# Patient Record
Sex: Male | Born: 1945 | Race: White | Hispanic: No | Marital: Married | Smoking: Former smoker
Health system: Southern US, Community
[De-identification: ages and names within clinical notes are randomized; demographics above are authoritative.]

## PROBLEM LIST (undated history)

## (undated) DIAGNOSIS — I10 Essential (primary) hypertension: Secondary | ICD-10-CM

## (undated) DIAGNOSIS — E039 Hypothyroidism, unspecified: Secondary | ICD-10-CM

## (undated) DIAGNOSIS — E785 Hyperlipidemia, unspecified: Secondary | ICD-10-CM

## (undated) DIAGNOSIS — J449 Chronic obstructive pulmonary disease, unspecified: Secondary | ICD-10-CM

## (undated) DIAGNOSIS — C801 Malignant (primary) neoplasm, unspecified: Secondary | ICD-10-CM

## (undated) DIAGNOSIS — R7309 Other abnormal glucose: Secondary | ICD-10-CM

## (undated) DIAGNOSIS — J439 Emphysema, unspecified: Secondary | ICD-10-CM

## (undated) DIAGNOSIS — Z9889 Other specified postprocedural states: Secondary | ICD-10-CM

## (undated) DIAGNOSIS — Z8719 Personal history of other diseases of the digestive system: Secondary | ICD-10-CM

## (undated) HISTORY — DX: Other abnormal glucose: R73.09

## (undated) HISTORY — DX: Hyperlipidemia, unspecified: E78.5

## (undated) HISTORY — DX: Hypothyroidism, unspecified: E03.9

## (undated) HISTORY — DX: Essential (primary) hypertension: I10

## (undated) HISTORY — PX: HERNIA REPAIR: SHX51

## (undated) HISTORY — DX: Other specified postprocedural states: Z98.890

## (undated) HISTORY — DX: Emphysema, unspecified: J43.9

## (undated) HISTORY — DX: Personal history of other diseases of the digestive system: Z87.19

## (undated) HISTORY — PX: CATARACT EXTRACTION, BILATERAL: SHX1313

## (undated) HISTORY — DX: Chronic obstructive pulmonary disease, unspecified: J44.9

---

## 2005-05-07 ENCOUNTER — Emergency Department (HOSPITAL_COMMUNITY): Admission: EM | Admit: 2005-05-07 | Discharge: 2005-05-07 | Payer: Self-pay | Admitting: Family Medicine

## 2005-05-07 ENCOUNTER — Ambulatory Visit (HOSPITAL_COMMUNITY): Admission: RE | Admit: 2005-05-07 | Discharge: 2005-05-07 | Payer: Self-pay | Admitting: Family Medicine

## 2005-05-11 ENCOUNTER — Ambulatory Visit (HOSPITAL_COMMUNITY)
Admission: RE | Admit: 2005-05-11 | Discharge: 2005-05-11 | Payer: Self-pay | Admitting: Thoracic Surgery (Cardiothoracic Vascular Surgery)

## 2005-05-20 ENCOUNTER — Ambulatory Visit: Payer: Self-pay | Admitting: Internal Medicine

## 2005-05-27 ENCOUNTER — Ambulatory Visit (HOSPITAL_COMMUNITY)
Admission: RE | Admit: 2005-05-27 | Discharge: 2005-05-27 | Payer: Self-pay | Admitting: Thoracic Surgery (Cardiothoracic Vascular Surgery)

## 2005-05-27 ENCOUNTER — Encounter (INDEPENDENT_AMBULATORY_CARE_PROVIDER_SITE_OTHER): Payer: Self-pay | Admitting: *Deleted

## 2005-06-06 ENCOUNTER — Ambulatory Visit: Admission: RE | Admit: 2005-06-06 | Discharge: 2005-08-12 | Payer: Self-pay | Admitting: Radiation Oncology

## 2005-07-08 ENCOUNTER — Ambulatory Visit: Payer: Self-pay | Admitting: Internal Medicine

## 2005-08-17 ENCOUNTER — Ambulatory Visit (HOSPITAL_COMMUNITY): Admission: RE | Admit: 2005-08-17 | Discharge: 2005-08-17 | Payer: Self-pay | Admitting: Internal Medicine

## 2005-08-23 ENCOUNTER — Ambulatory Visit: Payer: Self-pay | Admitting: Internal Medicine

## 2005-10-11 ENCOUNTER — Ambulatory Visit: Payer: Self-pay | Admitting: Internal Medicine

## 2005-11-23 ENCOUNTER — Ambulatory Visit (HOSPITAL_COMMUNITY): Admission: RE | Admit: 2005-11-23 | Discharge: 2005-11-23 | Payer: Self-pay | Admitting: Internal Medicine

## 2005-11-30 ENCOUNTER — Ambulatory Visit: Payer: Self-pay | Admitting: Internal Medicine

## 2006-01-23 ENCOUNTER — Ambulatory Visit: Payer: Self-pay | Admitting: Internal Medicine

## 2006-01-24 ENCOUNTER — Ambulatory Visit (HOSPITAL_COMMUNITY): Admission: RE | Admit: 2006-01-24 | Discharge: 2006-01-24 | Payer: Self-pay | Admitting: Internal Medicine

## 2006-01-25 LAB — CBC WITH DIFFERENTIAL/PLATELET
BASO%: 1 % (ref 0.0–2.0)
Basophils Absolute: 0.1 10*3/uL (ref 0.0–0.1)
EOS%: 3.5 % (ref 0.0–7.0)
Eosinophils Absolute: 0.2 10*3/uL (ref 0.0–0.5)
HCT: 39.2 % (ref 38.7–49.9)
HGB: 13.3 g/dL (ref 13.0–17.1)
LYMPH%: 17.3 % (ref 14.0–48.0)
MCH: 32.8 pg (ref 28.0–33.4)
MCHC: 34 g/dL (ref 32.0–35.9)
MCV: 96.6 fL (ref 81.6–98.0)
MONO#: 0.9 10*3/uL (ref 0.1–0.9)
MONO%: 15.2 % — ABNORMAL HIGH (ref 0.0–13.0)
NEUT#: 3.5 10*3/uL (ref 1.5–6.5)
NEUT%: 63 % (ref 40.0–75.0)
Platelets: 243 10*3/uL (ref 145–400)
RBC: 4.06 10*6/uL — ABNORMAL LOW (ref 4.20–5.71)
RDW: 13.6 % (ref 11.2–14.6)
WBC: 5.6 10*3/uL (ref 4.0–10.0)
lymph#: 1 10*3/uL (ref 0.9–3.3)

## 2006-01-25 LAB — COMPREHENSIVE METABOLIC PANEL
ALT: 11 U/L (ref 0–40)
AST: 15 U/L (ref 0–37)
Albumin: 3.9 g/dL (ref 3.5–5.2)
Alkaline Phosphatase: 69 U/L (ref 39–117)
BUN: 12 mg/dL (ref 6–23)
CO2: 27 mEq/L (ref 19–32)
Calcium: 8.6 mg/dL (ref 8.4–10.5)
Chloride: 106 mEq/L (ref 96–112)
Creatinine, Ser: 1 mg/dL (ref 0.4–1.5)
Glucose, Bld: 83 mg/dL (ref 70–99)
Potassium: 4.5 mEq/L (ref 3.5–5.3)
Sodium: 141 mEq/L (ref 135–145)
Total Bilirubin: 0.4 mg/dL (ref 0.3–1.2)
Total Protein: 6.5 g/dL (ref 6.0–8.3)

## 2006-04-19 ENCOUNTER — Ambulatory Visit: Payer: Self-pay | Admitting: Internal Medicine

## 2006-04-19 LAB — CBC WITH DIFFERENTIAL/PLATELET
BASO%: 0.7 % (ref 0.0–2.0)
Basophils Absolute: 0.1 10*3/uL (ref 0.0–0.1)
EOS%: 2.2 % (ref 0.0–7.0)
Eosinophils Absolute: 0.2 10*3/uL (ref 0.0–0.5)
HCT: 38.9 % (ref 38.7–49.9)
HGB: 13.2 g/dL (ref 13.0–17.1)
LYMPH%: 18.8 % (ref 14.0–48.0)
MCH: 32.4 pg (ref 28.0–33.4)
MCHC: 34 g/dL (ref 32.0–35.9)
MCV: 95.2 fL (ref 81.6–98.0)
MONO#: 1 10*3/uL — ABNORMAL HIGH (ref 0.1–0.9)
MONO%: 11.8 % (ref 0.0–13.0)
NEUT#: 5.4 10*3/uL (ref 1.5–6.5)
NEUT%: 66.5 % (ref 40.0–75.0)
Platelets: 249 10*3/uL (ref 145–400)
RBC: 4.09 10*6/uL — ABNORMAL LOW (ref 4.20–5.71)
RDW: 14 % (ref 11.2–14.6)
WBC: 8.1 10*3/uL (ref 4.0–10.0)
lymph#: 1.5 10*3/uL (ref 0.9–3.3)

## 2006-04-19 LAB — COMPREHENSIVE METABOLIC PANEL
ALT: 13 U/L (ref 0–40)
AST: 18 U/L (ref 0–37)
Albumin: 4.2 g/dL (ref 3.5–5.2)
Alkaline Phosphatase: 75 U/L (ref 39–117)
BUN: 15 mg/dL (ref 6–23)
CO2: 27 mEq/L (ref 19–32)
Calcium: 9.1 mg/dL (ref 8.4–10.5)
Chloride: 103 mEq/L (ref 96–112)
Creatinine, Ser: 1.01 mg/dL (ref 0.40–1.50)
Glucose, Bld: 77 mg/dL (ref 70–99)
Potassium: 4.2 mEq/L (ref 3.5–5.3)
Sodium: 139 mEq/L (ref 135–145)
Total Bilirubin: 0.3 mg/dL (ref 0.3–1.2)
Total Protein: 6.5 g/dL (ref 6.0–8.3)

## 2006-04-21 ENCOUNTER — Ambulatory Visit (HOSPITAL_COMMUNITY): Admission: RE | Admit: 2006-04-21 | Discharge: 2006-04-21 | Payer: Self-pay | Admitting: Internal Medicine

## 2006-07-17 ENCOUNTER — Ambulatory Visit: Payer: Self-pay | Admitting: Internal Medicine

## 2006-07-19 LAB — COMPREHENSIVE METABOLIC PANEL
ALT: 13 U/L (ref 0–53)
AST: 17 U/L (ref 0–37)
Albumin: 3.9 g/dL (ref 3.5–5.2)
Alkaline Phosphatase: 71 U/L (ref 39–117)
BUN: 18 mg/dL (ref 6–23)
CO2: 30 mEq/L (ref 19–32)
Calcium: 9.2 mg/dL (ref 8.4–10.5)
Chloride: 105 mEq/L (ref 96–112)
Creatinine, Ser: 1.14 mg/dL (ref 0.40–1.50)
Glucose, Bld: 103 mg/dL — ABNORMAL HIGH (ref 70–99)
Potassium: 4.1 mEq/L (ref 3.5–5.3)
Sodium: 143 mEq/L (ref 135–145)
Total Bilirubin: 0.3 mg/dL (ref 0.3–1.2)
Total Protein: 6.5 g/dL (ref 6.0–8.3)

## 2006-07-19 LAB — CBC WITH DIFFERENTIAL/PLATELET
BASO%: 0.7 % (ref 0.0–2.0)
Basophils Absolute: 0.1 10*3/uL (ref 0.0–0.1)
EOS%: 1.3 % (ref 0.0–7.0)
Eosinophils Absolute: 0.1 10*3/uL (ref 0.0–0.5)
HCT: 39.6 % (ref 38.7–49.9)
HGB: 13.4 g/dL (ref 13.0–17.1)
LYMPH%: 17.5 % (ref 14.0–48.0)
MCH: 32.3 pg (ref 28.0–33.4)
MCHC: 33.8 g/dL (ref 32.0–35.9)
MCV: 95.5 fL (ref 81.6–98.0)
MONO#: 1 10*3/uL — ABNORMAL HIGH (ref 0.1–0.9)
MONO%: 11.6 % (ref 0.0–13.0)
NEUT#: 6 10*3/uL (ref 1.5–6.5)
NEUT%: 68.9 % (ref 40.0–75.0)
Platelets: 249 10*3/uL (ref 145–400)
RBC: 4.15 10*6/uL — ABNORMAL LOW (ref 4.20–5.71)
RDW: 13.5 % (ref 11.2–14.6)
WBC: 8.7 10*3/uL (ref 4.0–10.0)
lymph#: 1.5 10*3/uL (ref 0.9–3.3)

## 2006-07-24 ENCOUNTER — Ambulatory Visit (HOSPITAL_COMMUNITY): Admission: RE | Admit: 2006-07-24 | Discharge: 2006-07-24 | Payer: Self-pay | Admitting: Internal Medicine

## 2006-11-13 ENCOUNTER — Ambulatory Visit: Payer: Self-pay | Admitting: Internal Medicine

## 2006-11-15 LAB — CBC WITH DIFFERENTIAL/PLATELET
BASO%: 1.4 % (ref 0.0–2.0)
Basophils Absolute: 0.1 10*3/uL (ref 0.0–0.1)
EOS%: 1.1 % (ref 0.0–7.0)
Eosinophils Absolute: 0.1 10*3/uL (ref 0.0–0.5)
HCT: 40.6 % (ref 38.7–49.9)
HGB: 14.2 g/dL (ref 13.0–17.1)
LYMPH%: 16.9 % (ref 14.0–48.0)
MCH: 32.9 pg (ref 28.0–33.4)
MCHC: 35 g/dL (ref 32.0–35.9)
MCV: 94 fL (ref 81.6–98.0)
MONO#: 0.9 10*3/uL (ref 0.1–0.9)
MONO%: 10.3 % (ref 0.0–13.0)
NEUT#: 6.4 10*3/uL (ref 1.5–6.5)
NEUT%: 70.3 % (ref 40.0–75.0)
Platelets: 267 10*3/uL (ref 145–400)
RBC: 4.32 10*6/uL (ref 4.20–5.71)
RDW: 13.7 % (ref 11.2–14.6)
WBC: 9.1 10*3/uL (ref 4.0–10.0)
lymph#: 1.5 10*3/uL (ref 0.9–3.3)

## 2006-11-15 LAB — COMPREHENSIVE METABOLIC PANEL
ALT: 16 U/L (ref 0–53)
AST: 29 U/L (ref 0–37)
Albumin: 4.1 g/dL (ref 3.5–5.2)
Alkaline Phosphatase: 78 U/L (ref 39–117)
BUN: 22 mg/dL (ref 6–23)
CO2: 28 mEq/L (ref 19–32)
Calcium: 8.9 mg/dL (ref 8.4–10.5)
Chloride: 103 mEq/L (ref 96–112)
Creatinine, Ser: 0.99 mg/dL (ref 0.40–1.50)
Glucose, Bld: 76 mg/dL (ref 70–99)
Potassium: 4.1 mEq/L (ref 3.5–5.3)
Sodium: 140 mEq/L (ref 135–145)
Total Bilirubin: 0.3 mg/dL (ref 0.3–1.2)
Total Protein: 7.1 g/dL (ref 6.0–8.3)

## 2006-11-20 ENCOUNTER — Ambulatory Visit (HOSPITAL_COMMUNITY): Admission: RE | Admit: 2006-11-20 | Discharge: 2006-11-20 | Payer: Self-pay | Admitting: Internal Medicine

## 2007-02-08 ENCOUNTER — Ambulatory Visit: Payer: Self-pay | Admitting: Internal Medicine

## 2007-02-12 LAB — COMPREHENSIVE METABOLIC PANEL
ALT: 12 U/L (ref 0–53)
AST: 17 U/L (ref 0–37)
Albumin: 4 g/dL (ref 3.5–5.2)
Alkaline Phosphatase: 70 U/L (ref 39–117)
BUN: 15 mg/dL (ref 6–23)
CO2: 24 mEq/L (ref 19–32)
Calcium: 9 mg/dL (ref 8.4–10.5)
Chloride: 105 mEq/L (ref 96–112)
Creatinine, Ser: 0.99 mg/dL (ref 0.40–1.50)
Glucose, Bld: 83 mg/dL (ref 70–99)
Potassium: 4.1 mEq/L (ref 3.5–5.3)
Sodium: 141 mEq/L (ref 135–145)
Total Bilirubin: 0.3 mg/dL (ref 0.3–1.2)
Total Protein: 6.9 g/dL (ref 6.0–8.3)

## 2007-02-12 LAB — CBC WITH DIFFERENTIAL/PLATELET
BASO%: 0.5 % (ref 0.0–2.0)
Basophils Absolute: 0 10*3/uL (ref 0.0–0.1)
EOS%: 1.6 % (ref 0.0–7.0)
Eosinophils Absolute: 0.1 10*3/uL (ref 0.0–0.5)
HCT: 39.9 % (ref 38.7–49.9)
HGB: 13.9 g/dL (ref 13.0–17.1)
LYMPH%: 21.4 % (ref 14.0–48.0)
MCH: 32.8 pg (ref 28.0–33.4)
MCHC: 34.9 g/dL (ref 32.0–35.9)
MCV: 94 fL (ref 81.6–98.0)
MONO#: 1 10*3/uL — ABNORMAL HIGH (ref 0.1–0.9)
MONO%: 13.1 % — ABNORMAL HIGH (ref 0.0–13.0)
NEUT#: 4.9 10*3/uL (ref 1.5–6.5)
NEUT%: 63.4 % (ref 40.0–75.0)
Platelets: 234 10*3/uL (ref 145–400)
RBC: 4.25 10*6/uL (ref 4.20–5.71)
RDW: 13.2 % (ref 11.2–14.6)
WBC: 7.7 10*3/uL (ref 4.0–10.0)
lymph#: 1.6 10*3/uL (ref 0.9–3.3)

## 2007-02-14 ENCOUNTER — Ambulatory Visit (HOSPITAL_COMMUNITY): Admission: RE | Admit: 2007-02-14 | Discharge: 2007-02-14 | Payer: Self-pay | Admitting: Internal Medicine

## 2007-06-11 ENCOUNTER — Ambulatory Visit: Payer: Self-pay | Admitting: Internal Medicine

## 2007-06-13 LAB — CBC WITH DIFFERENTIAL/PLATELET
BASO%: 0.7 % (ref 0.0–2.0)
Basophils Absolute: 0 10*3/uL (ref 0.0–0.1)
EOS%: 0.8 % (ref 0.0–7.0)
Eosinophils Absolute: 0.1 10*3/uL (ref 0.0–0.5)
HCT: 38.3 % — ABNORMAL LOW (ref 38.7–49.9)
HGB: 13.2 g/dL (ref 13.0–17.1)
LYMPH%: 21.2 % (ref 14.0–48.0)
MCH: 32.7 pg (ref 28.0–33.4)
MCHC: 34.5 g/dL (ref 32.0–35.9)
MCV: 94.7 fL (ref 81.6–98.0)
MONO#: 1 10*3/uL — ABNORMAL HIGH (ref 0.1–0.9)
MONO%: 14.4 % — ABNORMAL HIGH (ref 0.0–13.0)
NEUT#: 4.4 10*3/uL (ref 1.5–6.5)
NEUT%: 62.9 % (ref 40.0–75.0)
Platelets: 232 10*3/uL (ref 145–400)
RBC: 4.04 10*6/uL — ABNORMAL LOW (ref 4.20–5.71)
RDW: 13.4 % (ref 11.2–14.6)
WBC: 7 10*3/uL (ref 4.0–10.0)
lymph#: 1.5 10*3/uL (ref 0.9–3.3)

## 2007-06-13 LAB — COMPREHENSIVE METABOLIC PANEL
ALT: 13 U/L (ref 0–53)
AST: 17 U/L (ref 0–37)
Albumin: 3.9 g/dL (ref 3.5–5.2)
Alkaline Phosphatase: 65 U/L (ref 39–117)
BUN: 15 mg/dL (ref 6–23)
CO2: 26 mEq/L (ref 19–32)
Calcium: 8.8 mg/dL (ref 8.4–10.5)
Chloride: 106 mEq/L (ref 96–112)
Creatinine, Ser: 0.9 mg/dL (ref 0.40–1.50)
Glucose, Bld: 94 mg/dL (ref 70–99)
Potassium: 4.1 mEq/L (ref 3.5–5.3)
Sodium: 140 mEq/L (ref 135–145)
Total Bilirubin: 0.4 mg/dL (ref 0.3–1.2)
Total Protein: 6.6 g/dL (ref 6.0–8.3)

## 2007-06-15 ENCOUNTER — Ambulatory Visit (HOSPITAL_COMMUNITY): Admission: RE | Admit: 2007-06-15 | Discharge: 2007-06-15 | Payer: Self-pay | Admitting: Internal Medicine

## 2007-12-10 ENCOUNTER — Ambulatory Visit: Payer: Self-pay | Admitting: Internal Medicine

## 2007-12-12 LAB — CBC WITH DIFFERENTIAL/PLATELET
BASO%: 0.6 % (ref 0.0–2.0)
Basophils Absolute: 0 10*3/uL (ref 0.0–0.1)
EOS%: 1.2 % (ref 0.0–7.0)
Eosinophils Absolute: 0.1 10*3/uL (ref 0.0–0.5)
HCT: 40.4 % (ref 38.7–49.9)
HGB: 14 g/dL (ref 13.0–17.1)
LYMPH%: 19.4 % (ref 14.0–48.0)
MCH: 32.3 pg (ref 28.0–33.4)
MCHC: 34.8 g/dL (ref 32.0–35.9)
MCV: 92.9 fL (ref 81.6–98.0)
MONO#: 0.9 10*3/uL (ref 0.1–0.9)
MONO%: 10.2 % (ref 0.0–13.0)
NEUT#: 5.9 10*3/uL (ref 1.5–6.5)
NEUT%: 68.6 % (ref 40.0–75.0)
Platelets: 213 10*3/uL (ref 145–400)
RBC: 4.35 10*6/uL (ref 4.20–5.71)
RDW: 13.3 % (ref 11.2–14.6)
WBC: 8.6 10*3/uL (ref 4.0–10.0)
lymph#: 1.7 10*3/uL (ref 0.9–3.3)

## 2007-12-12 LAB — COMPREHENSIVE METABOLIC PANEL
ALT: 15 U/L (ref 0–53)
AST: 18 U/L (ref 0–37)
Albumin: 4.2 g/dL (ref 3.5–5.2)
Alkaline Phosphatase: 82 U/L (ref 39–117)
BUN: 22 mg/dL (ref 6–23)
CO2: 24 mEq/L (ref 19–32)
Calcium: 9.1 mg/dL (ref 8.4–10.5)
Chloride: 105 mEq/L (ref 96–112)
Creatinine, Ser: 1.08 mg/dL (ref 0.40–1.50)
Glucose, Bld: 95 mg/dL (ref 70–99)
Potassium: 4.5 mEq/L (ref 3.5–5.3)
Sodium: 140 mEq/L (ref 135–145)
Total Bilirubin: 0.3 mg/dL (ref 0.3–1.2)
Total Protein: 6.8 g/dL (ref 6.0–8.3)

## 2007-12-14 ENCOUNTER — Ambulatory Visit (HOSPITAL_COMMUNITY): Admission: RE | Admit: 2007-12-14 | Discharge: 2007-12-14 | Payer: Self-pay | Admitting: Internal Medicine

## 2008-06-09 ENCOUNTER — Ambulatory Visit: Payer: Self-pay | Admitting: Internal Medicine

## 2008-06-11 LAB — COMPREHENSIVE METABOLIC PANEL
ALT: 15 U/L (ref 0–53)
AST: 18 U/L (ref 0–37)
Albumin: 3.9 g/dL (ref 3.5–5.2)
Alkaline Phosphatase: 71 U/L (ref 39–117)
BUN: 18 mg/dL (ref 6–23)
CO2: 25 mEq/L (ref 19–32)
Calcium: 9.1 mg/dL (ref 8.4–10.5)
Chloride: 105 mEq/L (ref 96–112)
Creatinine, Ser: 1 mg/dL (ref 0.40–1.50)
Glucose, Bld: 96 mg/dL (ref 70–99)
Potassium: 4.1 mEq/L (ref 3.5–5.3)
Sodium: 140 mEq/L (ref 135–145)
Total Bilirubin: 0.3 mg/dL (ref 0.3–1.2)
Total Protein: 6.4 g/dL (ref 6.0–8.3)

## 2008-06-11 LAB — CBC WITH DIFFERENTIAL/PLATELET
BASO%: 0.5 % (ref 0.0–2.0)
Basophils Absolute: 0 10*3/uL (ref 0.0–0.1)
EOS%: 1.6 % (ref 0.0–7.0)
Eosinophils Absolute: 0.1 10*3/uL (ref 0.0–0.5)
HCT: 39.7 % (ref 38.7–49.9)
HGB: 13.6 g/dL (ref 13.0–17.1)
LYMPH%: 25.4 % (ref 14.0–48.0)
MCH: 32.8 pg (ref 28.0–33.4)
MCHC: 34.3 g/dL (ref 32.0–35.9)
MCV: 95.4 fL (ref 81.6–98.0)
MONO#: 0.7 10*3/uL (ref 0.1–0.9)
MONO%: 11.7 % (ref 0.0–13.0)
NEUT#: 3.5 10*3/uL (ref 1.5–6.5)
NEUT%: 60.8 % (ref 40.0–75.0)
Platelets: 198 10*3/uL (ref 145–400)
RBC: 4.16 10*6/uL — ABNORMAL LOW (ref 4.20–5.71)
RDW: 13.1 % (ref 11.2–14.6)
WBC: 5.8 10*3/uL (ref 4.0–10.0)
lymph#: 1.5 10*3/uL (ref 0.9–3.3)

## 2008-06-13 ENCOUNTER — Ambulatory Visit (HOSPITAL_COMMUNITY): Admission: RE | Admit: 2008-06-13 | Discharge: 2008-06-13 | Payer: Self-pay | Admitting: Internal Medicine

## 2008-07-10 ENCOUNTER — Ambulatory Visit: Payer: Self-pay | Admitting: Cardiology

## 2008-07-25 ENCOUNTER — Ambulatory Visit: Payer: Self-pay

## 2008-07-25 ENCOUNTER — Ambulatory Visit: Payer: Self-pay | Admitting: Cardiology

## 2008-09-09 ENCOUNTER — Ambulatory Visit: Payer: Self-pay | Admitting: Internal Medicine

## 2008-09-11 LAB — CBC WITH DIFFERENTIAL/PLATELET
BASO%: 0.3 % (ref 0.0–2.0)
Basophils Absolute: 0 10*3/uL (ref 0.0–0.1)
EOS%: 1.2 % (ref 0.0–7.0)
Eosinophils Absolute: 0.1 10*3/uL (ref 0.0–0.5)
HCT: 41 % (ref 38.7–49.9)
HGB: 14 g/dL (ref 13.0–17.1)
LYMPH%: 19.5 % (ref 14.0–48.0)
MCH: 32.8 pg (ref 28.0–33.4)
MCHC: 34.1 g/dL (ref 32.0–35.9)
MCV: 96.2 fL (ref 81.6–98.0)
MONO#: 0.8 10*3/uL (ref 0.1–0.9)
MONO%: 10 % (ref 0.0–13.0)
NEUT#: 5.3 10*3/uL (ref 1.5–6.5)
NEUT%: 69 % (ref 40.0–75.0)
Platelets: 216 10*3/uL (ref 145–400)
RBC: 4.27 10*6/uL (ref 4.20–5.71)
RDW: 13.3 % (ref 11.2–14.6)
WBC: 7.7 10*3/uL (ref 4.0–10.0)
lymph#: 1.5 10*3/uL (ref 0.9–3.3)

## 2008-09-11 LAB — COMPREHENSIVE METABOLIC PANEL
ALT: 25 U/L (ref 0–53)
AST: 30 U/L (ref 0–37)
Albumin: 3.8 g/dL (ref 3.5–5.2)
Alkaline Phosphatase: 70 U/L (ref 39–117)
BUN: 18 mg/dL (ref 6–23)
CO2: 31 mEq/L (ref 19–32)
Calcium: 8.9 mg/dL (ref 8.4–10.5)
Chloride: 102 mEq/L (ref 96–112)
Creatinine, Ser: 1.1 mg/dL (ref 0.40–1.50)
Glucose, Bld: 88 mg/dL (ref 70–99)
Potassium: 4.5 mEq/L (ref 3.5–5.3)
Sodium: 140 mEq/L (ref 135–145)
Total Bilirubin: 0.5 mg/dL (ref 0.3–1.2)
Total Protein: 6.5 g/dL (ref 6.0–8.3)

## 2008-09-15 ENCOUNTER — Ambulatory Visit (HOSPITAL_COMMUNITY): Admission: RE | Admit: 2008-09-15 | Discharge: 2008-09-15 | Payer: Self-pay | Admitting: Internal Medicine

## 2008-12-09 ENCOUNTER — Ambulatory Visit: Payer: Self-pay | Admitting: Internal Medicine

## 2008-12-11 LAB — CBC WITH DIFFERENTIAL/PLATELET
BASO%: 0.6 % (ref 0.0–2.0)
Basophils Absolute: 0 10*3/uL (ref 0.0–0.1)
EOS%: 1.9 % (ref 0.0–7.0)
Eosinophils Absolute: 0.1 10*3/uL (ref 0.0–0.5)
HCT: 39.1 % (ref 38.4–49.9)
HGB: 13.5 g/dL (ref 13.0–17.1)
LYMPH%: 22.4 % (ref 14.0–49.0)
MCH: 32.8 pg (ref 27.2–33.4)
MCHC: 34.5 g/dL (ref 32.0–36.0)
MCV: 95.2 fL (ref 79.3–98.0)
MONO#: 0.6 10*3/uL (ref 0.1–0.9)
MONO%: 9.5 % (ref 0.0–14.0)
NEUT#: 4.2 10*3/uL (ref 1.5–6.5)
NEUT%: 65.6 % (ref 39.0–75.0)
Platelets: 214 10*3/uL (ref 140–400)
RBC: 4.11 10*6/uL — ABNORMAL LOW (ref 4.20–5.82)
RDW: 13 % (ref 11.0–14.6)
WBC: 6.4 10*3/uL (ref 4.0–10.3)
lymph#: 1.4 10*3/uL (ref 0.9–3.3)

## 2008-12-11 LAB — COMPREHENSIVE METABOLIC PANEL
ALT: 14 U/L (ref 0–53)
AST: 22 U/L (ref 0–37)
Albumin: 4 g/dL (ref 3.5–5.2)
Alkaline Phosphatase: 82 U/L (ref 39–117)
BUN: 18 mg/dL (ref 6–23)
CO2: 27 mEq/L (ref 19–32)
Calcium: 9.3 mg/dL (ref 8.4–10.5)
Chloride: 105 mEq/L (ref 96–112)
Creatinine, Ser: 0.99 mg/dL (ref 0.40–1.50)
Glucose, Bld: 108 mg/dL — ABNORMAL HIGH (ref 70–99)
Potassium: 3.9 mEq/L (ref 3.5–5.3)
Sodium: 142 mEq/L (ref 135–145)
Total Bilirubin: 0.3 mg/dL (ref 0.3–1.2)
Total Protein: 6.2 g/dL (ref 6.0–8.3)

## 2008-12-15 ENCOUNTER — Ambulatory Visit (HOSPITAL_COMMUNITY): Admission: RE | Admit: 2008-12-15 | Discharge: 2008-12-15 | Payer: Self-pay | Admitting: Internal Medicine

## 2009-05-11 DIAGNOSIS — Z8719 Personal history of other diseases of the digestive system: Secondary | ICD-10-CM

## 2009-05-11 DIAGNOSIS — Z9889 Other specified postprocedural states: Secondary | ICD-10-CM

## 2009-05-11 DIAGNOSIS — I2510 Atherosclerotic heart disease of native coronary artery without angina pectoris: Secondary | ICD-10-CM | POA: Insufficient documentation

## 2009-05-11 HISTORY — DX: Personal history of other diseases of the digestive system: Z87.19

## 2009-06-08 ENCOUNTER — Ambulatory Visit: Payer: Self-pay | Admitting: Internal Medicine

## 2009-06-10 LAB — CBC WITH DIFFERENTIAL/PLATELET
BASO%: 0.8 % (ref 0.0–2.0)
Basophils Absolute: 0.1 10*3/uL (ref 0.0–0.1)
EOS%: 2.8 % (ref 0.0–7.0)
Eosinophils Absolute: 0.2 10*3/uL (ref 0.0–0.5)
HCT: 41.4 % (ref 38.4–49.9)
HGB: 14.1 g/dL (ref 13.0–17.1)
LYMPH%: 25.5 % (ref 14.0–49.0)
MCH: 32.6 pg (ref 27.2–33.4)
MCHC: 34.1 g/dL (ref 32.0–36.0)
MCV: 95.5 fL (ref 79.3–98.0)
MONO#: 0.8 10*3/uL (ref 0.1–0.9)
MONO%: 12.6 % (ref 0.0–14.0)
NEUT#: 3.7 10*3/uL (ref 1.5–6.5)
NEUT%: 58.3 % (ref 39.0–75.0)
Platelets: 212 10*3/uL (ref 140–400)
RBC: 4.34 10*6/uL (ref 4.20–5.82)
RDW: 13.5 % (ref 11.0–14.6)
WBC: 6.3 10*3/uL (ref 4.0–10.3)
lymph#: 1.6 10*3/uL (ref 0.9–3.3)

## 2009-06-10 LAB — COMPREHENSIVE METABOLIC PANEL
ALT: 15 U/L (ref 0–53)
AST: 22 U/L (ref 0–37)
Albumin: 4 g/dL (ref 3.5–5.2)
Alkaline Phosphatase: 71 U/L (ref 39–117)
BUN: 18 mg/dL (ref 6–23)
CO2: 28 mEq/L (ref 19–32)
Calcium: 8.9 mg/dL (ref 8.4–10.5)
Chloride: 103 mEq/L (ref 96–112)
Creatinine, Ser: 1 mg/dL (ref 0.40–1.50)
Glucose, Bld: 89 mg/dL (ref 70–99)
Potassium: 4.5 mEq/L (ref 3.5–5.3)
Sodium: 139 mEq/L (ref 135–145)
Total Bilirubin: 0.3 mg/dL (ref 0.3–1.2)
Total Protein: 6.6 g/dL (ref 6.0–8.3)

## 2009-06-12 ENCOUNTER — Ambulatory Visit (HOSPITAL_COMMUNITY): Admission: RE | Admit: 2009-06-12 | Discharge: 2009-06-12 | Payer: Self-pay | Admitting: Internal Medicine

## 2009-12-04 ENCOUNTER — Ambulatory Visit: Payer: Self-pay | Admitting: Internal Medicine

## 2009-12-08 ENCOUNTER — Ambulatory Visit (HOSPITAL_COMMUNITY): Admission: RE | Admit: 2009-12-08 | Discharge: 2009-12-08 | Payer: Self-pay | Admitting: Internal Medicine

## 2009-12-08 LAB — CBC WITH DIFFERENTIAL/PLATELET
BASO%: 0.6 % (ref 0.0–2.0)
Basophils Absolute: 0 10*3/uL (ref 0.0–0.1)
EOS%: 1.9 % (ref 0.0–7.0)
Eosinophils Absolute: 0.1 10*3/uL (ref 0.0–0.5)
HCT: 42.5 % (ref 38.4–49.9)
HGB: 14.3 g/dL (ref 13.0–17.1)
LYMPH%: 24.2 % (ref 14.0–49.0)
MCH: 32 pg (ref 27.2–33.4)
MCHC: 33.6 g/dL (ref 32.0–36.0)
MCV: 95.1 fL (ref 79.3–98.0)
MONO#: 0.9 10*3/uL (ref 0.1–0.9)
MONO%: 13.7 % (ref 0.0–14.0)
NEUT#: 3.8 10*3/uL (ref 1.5–6.5)
NEUT%: 59.6 % (ref 39.0–75.0)
Platelets: 183 10*3/uL (ref 140–400)
RBC: 4.47 10*6/uL (ref 4.20–5.82)
RDW: 12.9 % (ref 11.0–14.6)
WBC: 6.3 10*3/uL (ref 4.0–10.3)
lymph#: 1.5 10*3/uL (ref 0.9–3.3)

## 2009-12-08 LAB — COMPREHENSIVE METABOLIC PANEL
ALT: 18 U/L (ref 0–53)
AST: 28 U/L (ref 0–37)
Albumin: 3.7 g/dL (ref 3.5–5.2)
Alkaline Phosphatase: 63 U/L (ref 39–117)
BUN: 15 mg/dL (ref 6–23)
CO2: 29 mEq/L (ref 19–32)
Calcium: 9 mg/dL (ref 8.4–10.5)
Chloride: 104 mEq/L (ref 96–112)
Creatinine, Ser: 1.05 mg/dL (ref 0.40–1.50)
Glucose, Bld: 88 mg/dL (ref 70–99)
Potassium: 4.1 mEq/L (ref 3.5–5.3)
Sodium: 139 mEq/L (ref 135–145)
Total Bilirubin: 0.6 mg/dL (ref 0.3–1.2)
Total Protein: 6.7 g/dL (ref 6.0–8.3)

## 2010-06-09 ENCOUNTER — Ambulatory Visit: Payer: Self-pay | Admitting: Internal Medicine

## 2010-06-11 ENCOUNTER — Ambulatory Visit (HOSPITAL_COMMUNITY): Admission: RE | Admit: 2010-06-11 | Discharge: 2010-06-11 | Payer: Self-pay | Admitting: Internal Medicine

## 2010-06-11 LAB — CBC WITH DIFFERENTIAL/PLATELET
BASO%: 0.4 % (ref 0.0–2.0)
Basophils Absolute: 0 10*3/uL (ref 0.0–0.1)
EOS%: 2.1 % (ref 0.0–7.0)
Eosinophils Absolute: 0.1 10*3/uL (ref 0.0–0.5)
HCT: 42.5 % (ref 38.4–49.9)
HGB: 14.6 g/dL (ref 13.0–17.1)
LYMPH%: 20.7 % (ref 14.0–49.0)
MCH: 33.1 pg (ref 27.2–33.4)
MCHC: 34.5 g/dL (ref 32.0–36.0)
MCV: 96 fL (ref 79.3–98.0)
MONO#: 0.7 10*3/uL (ref 0.1–0.9)
MONO%: 11.8 % (ref 0.0–14.0)
NEUT#: 4.1 10*3/uL (ref 1.5–6.5)
NEUT%: 65 % (ref 39.0–75.0)
Platelets: 211 10*3/uL (ref 140–400)
RBC: 4.42 10*6/uL (ref 4.20–5.82)
RDW: 13.5 % (ref 11.0–14.6)
WBC: 6.3 10*3/uL (ref 4.0–10.3)
lymph#: 1.3 10*3/uL (ref 0.9–3.3)

## 2010-06-11 LAB — COMPREHENSIVE METABOLIC PANEL
ALT: 22 U/L (ref 0–53)
AST: 25 U/L (ref 0–37)
Albumin: 4 g/dL (ref 3.5–5.2)
Alkaline Phosphatase: 62 U/L (ref 39–117)
BUN: 14 mg/dL (ref 6–23)
CO2: 30 mEq/L (ref 19–32)
Calcium: 9.4 mg/dL (ref 8.4–10.5)
Chloride: 106 mEq/L (ref 96–112)
Creatinine, Ser: 1.11 mg/dL (ref 0.40–1.50)
Glucose, Bld: 93 mg/dL (ref 70–99)
Potassium: 4 mEq/L (ref 3.5–5.3)
Sodium: 141 mEq/L (ref 135–145)
Total Bilirubin: 0.5 mg/dL (ref 0.3–1.2)
Total Protein: 7.2 g/dL (ref 6.0–8.3)

## 2010-09-25 ENCOUNTER — Other Ambulatory Visit: Payer: Self-pay | Admitting: Internal Medicine

## 2010-09-25 DIAGNOSIS — C3490 Malignant neoplasm of unspecified part of unspecified bronchus or lung: Secondary | ICD-10-CM

## 2011-01-18 NOTE — Assessment & Plan Note (Signed)
[facility]                            CARDIOLOGY OFFICE NOTE   NAME:Christopher Peck, Christopher Peck                      MRN:          [id number]  DATE:07/10/2008                            DOB:          1946/03/26    PRIMARY CARE PHYSICIAN:  Lovenia Kim, DO.   REASON FOR PRESENTATION:  Evaluate the patient with CT of the chest  demonstrating coronary atherosclerosis.   HISTORY OF PRESENT ILLNESS:  The patient is a pleasant 65 year old  gentleman, who has a history of non-small cell lung cancer.  He was  treated with chemotherapy and radiation and has been out 3 years with no  recurrence.  However, on CT scan done last month, he was noted to have  coronary artery atherosclerosis.  Because of this and some complaints of  dyspnea and chest discomfort, he is referred.   The patient has never had any cardiac history.  He has never had any  cardiac workup.  He does do some walking everyday.  He walks 1-2 miles.  He does not walk particularly quickly.  He says when he does, he may get  short of breath.  He has noticed this for 3 years since he had his  radiation.  It is not profound.  He does not have it at rest.  He does  not have any PND or orthopnea.  It is not getting any worse.  He does  have back discomfort.  It is under his left scapula.  There is a point  of tenderness.  It becomes sometimes quite intense.  It happens  sporadically.  The etiology has not been clear.  He has been treated  with tramadol, which seems to help.  He cannot bring this on.  It does  not radiate.  It is not associated with the shortness of breath and he  has no nausea, vomiting, or diaphoresis with this.   PAST MEDICAL HISTORY:  Stage IIIA non-small cell lung cancer, squamous  cell, 2006 (treated with radiation, carboplatin, and paclitaxel,  docetaxel), apparent radiation fibrosis.   PAST SURGICAL HISTORY:  Bilateral inguinal hernia repair.   ALLERGIES:  None.   MEDICATIONS:   Citalopram and vitamin D.   SOCIAL HISTORY:  The patient is married.  He has 2 children.  He smoked  1 pack per day for about 40 years, but quit 5 years ago.   FAMILY HISTORY:  Noncontributory for early coronary artery disease.   REVIEW OF SYSTEMS:  As stated in the HPI, positive for occasional  dizziness, ringing in his ears, dentures, incontinence, and joint pains.  Negative for all other systems.   PHYSICAL EXAMINATION:  GENERAL:  The patient is pleasant and in no  distress.  VITAL SIGNS:  Blood pressure 133/78, heart rate 58 and regular, and  weight 191 pounds.  HEENT:  Eyelids are unremarkable.  Pupils equal, round, and reactive to  light.  Fundi not visualized.  Oral mucosa unremarkable.  NECK:  No  jugular venous distention at 45 degrees, carotid upstroke brisk and  symmetrical.  No bruits.  No thyromegaly.  LYMPHATICS:  No cervical, axillary, or inguinal adenopathy.  LUNGS:  Clear to auscultation bilaterally.  BACK:  No costovertebral angle tenderness.  CHEST:  Unremarkable.  HEART:  PMI not displaced or sustained.  S1 and S2 within normal limits.  No S3, no S4.  No clicks, no rubs, and no murmurs.  ABDOMEN:  Flat, positive bowel sounds, normal in frequency and pitch, no  bruits, no rebound, no guarding, or no midline pulsatile mass.  No  hepatomegaly or splenomegaly.  SKIN:  No rashes, no nodules.  EXTREMITIES:  Pulses 2+ throughout.  No edema, cyanosis, or clubbing.  NEUROLOGIC:  Oriented to person, place, and time.  Cranial nerves II  through XII grossly intact, motor grossly intact.   EKG:  Sinus bradycardia, rate 57, axis within normal limits, intervals  within normal limits, and no acute ST-T wave changes.   ASSESSMENT AND PLAN:  1. Coronary atherosclerosis:  The patient had this documented with      apparent calcification on a CT noted incidentally.  He has no      symptoms consistent with obstructive coronary artery disease.  He      has had some dyspnea, but  this has been since his radiation      therapy.  At this point, I think the possibility of obstructive      atherosclerosis is on the low to low-moderate level.  I think,      screening with an exercise treadmill would be reasonable.  In      particular, this would allow me to answer some questions he has      about an exercise regimen and what he should try to do.  This will      allow me to risk-stratify as well as rule out obstructive disease.      He understands from our conversation that this study does indicate      that he most likely has nonobstructive disease, but we would manage      this with risk reduction in the absence of symptoms or evidence of      ischemia.  2. Dyspnea:  We will evaluate this.  I will check a BNP level when he      comes back as well.  I suspect again this is related to his      previous lung problems.  3. Tobacco:  The patient quit smoking 5 years ago.  4. Followup:  I will see him at the time of his plain old exercise      treadmill (POET).     Rollene Rotunda, MD, [facility]  Electronically Signed    JH/MedQ  DD: 07/10/2008  DT: 07/11/2008  Job #: [id number]   cc:   Lovenia Kim, D.O.

## 2011-01-18 NOTE — Procedures (Signed)
[facility]                              EXERCISE TREADMILL   NAME:Brickner, ASIAH BEFORT                      MRN:          [id number]  DATE:07/25/2008                            DOB:          1946/04/03    PROCEDURE:  Exercise treadmill test.   INDICATION:  The patient with coronary calcification and dyspnea.   PROCEDURE NOTE:  The patient was only able to exercise for 5 minutes and  25 seconds.  His test was stopped because he was very dyspneic.  He did  achieve a target heart rate of 137 which is 85% predicted, actually it  was after he had already stopped walking on the machine.  His blood  pressure actually dropped to 119/60 with exertion.  His oxygen  saturation was in the 80s and he was quite dyspneic at level II.  He  achieved 7.2 METS.  He did not have any arrhythmias.  He had no chest  pain.  He had dyspnea.  He had no ischemic ST-T wave changes.  He did  not have a normal heart rate recovery.   CONCLUSION:  Technically inadequate study.  His heart rate did not reach  target with exercise, though it did in recovery.  He had an abnormal  blood pressure response and so I must interpret this as potentially  positive result.  Based on this, I could not exclude obstructive  coronary artery disease accompanying his known coronary artery  calcification.  His dyspnea may well be related to his lung cancer and  questionable fibrosis.  However, I could not exclude a cardiac etiology.   PLAN:  I discussed this at length with the patient and his wife.  At  this point, they would like to consider their options.  I have suggested  stress perfusion study.  However, financially it is very difficult and  they would really like to defer this, but will get back to me.  For now,  he will continue to follow with Dr. Arbutus Ped very carefully and he will  let me know if he has any increasing symptoms.     Rollene Rotunda, MD, [facility]  Electronically Signed    JH/MedQ  DD: 07/25/2008  DT: 07/26/2008  Job #: [id number]   cc:   Lajuana Matte, MD

## 2011-01-21 NOTE — Op Note (Signed)
NAME:  Christopher Peck, Christopher Peck               ACCOUNT NO.:  [id number]   MEDICAL RECORD NO.:  [id number]          PATIENT TYPE:  AMB   LOCATION:  SDS                          FACILITY:  [facility]   PHYSICIAN:  Salvatore Decent. Dorris Fetch, M.D.DATE OF BIRTH:  1945/09/14   DATE OF PROCEDURE:  05/27/2005  DATE OF DISCHARGE:  05/27/2005                                 OPERATIVE REPORT   PREOPERATIVE DIAGNOSIS:  Left hilar mass.   POSTOPERATIVE DIAGNOSIS:  Left hilar mass.   PROCEDURE:  Bronchoscopy.   SURGEON:  Salvatore Decent. Dorris Fetch, M.D.   ASSISTANT:  None.   ANESTHESIA:  General.   FINDINGS:  Bronchoscopy:  Mild erythema at the anterior segmental branch of  the left upper lobe.  No bronchial mass seen on the bronchial anatomy.  Mediastinoscopy:  Multiple enlarged, edematous, friable lymph nodes for  frozen section x4, no tumor seen.   CLINICAL NOTE:  [name] is a 65 year old gentleman recently diagnosed  with a left hilar mass, suspicious for a primary bronchogenic carcinoma. He  was advised to undergo bronchoscopy and mediastinoscopy for attempt at  diagnosis and staging.  The indications, risks, benefits and alternatives  were discussed in detail with the patient; he understood this was a  diagnostic and not a curative procedure.   OPERATIVE NOTE:  [name] was brought to the preoperative holding area on  May 27, 2005. There lines were placed by the anesthesia service for  arterial blood pressure monitoring and venous access. Intravenous  antibiotics were administered. He was taken to the operating room,  anesthetized and intubated. Flexible fiberoptic bronchoscopy was carried out  via the endotracheal tube.  This revealed normal endobronchial anatomy with  no endobronchial lesions seen. There was some mild erythema at the anterior  segmental orifice of the left upper lobe bronchus. Brushings and washings  were taken from the left upper lobe bronchus.   The neck and chest then  were prepped and draped in the usual sterile  fashion. A transverse incision was made one fingerbreadth above the sternal  notch; was carried through the skin and subcutaneous tissue. The platysma  was divided.  The strap muscles were separated in the midline.  The  pretracheal fascia was identified and incised.  The pretracheal plane was  developed bluntly into the mediastinum.  The mediastinal scope was inserted  and systematic inspection and dissection of the paratracheal lymph nodes was  carried out.  Multiple enlarged friable, fleshy nodes were identified.  There was no obvious gross tumor involvement.  Biopsies were taken from  three separate level 4R  nodes; 3 level 7 nodes, 2 the level 4L nodes, and 2  level 3 nodes.  Four of these specimens were sent for frozen section; none  of them showed any evidence of tumor.  Rather than persist with additional  frozen sections, the decision was made to wait for the final pathology.  The  wound was packed with gauze. After waiting 5 minutes, the gauze packing was  removed. The mediastinal scope was reinserted.  Final hemostasis was carried  out.  The  mediastinal scope was  withdrawn. The incision then was closed in standard  fashion, with 3-0 Vicryl interrupted sutures in the platysma and 4-0 Vicryl  subcuticular closure.  The patient was extubated in the operating room and  taken to the postanesthetic care unit in stable condition.           ______________________________  Salvatore Decent Dorris Fetch, M.D.     SCH/MEDQ  D:  05/27/2005  T:  05/29/2005  Job:  [id number]   cc:   Viviann Spare A. Waynette Buttery, M.D.  Fax: [phone]

## 2011-06-08 ENCOUNTER — Other Ambulatory Visit (HOSPITAL_COMMUNITY): Payer: Self-pay

## 2011-07-26 ENCOUNTER — Other Ambulatory Visit (HOSPITAL_COMMUNITY): Payer: Self-pay | Admitting: *Deleted

## 2011-07-26 ENCOUNTER — Other Ambulatory Visit (HOSPITAL_COMMUNITY): Payer: Self-pay | Admitting: Internal Medicine

## 2011-07-26 DIAGNOSIS — C3490 Malignant neoplasm of unspecified part of unspecified bronchus or lung: Secondary | ICD-10-CM

## 2011-08-02 ENCOUNTER — Ambulatory Visit (HOSPITAL_COMMUNITY)
Admission: RE | Admit: 2011-08-02 | Discharge: 2011-08-02 | Disposition: A | Discharge status: Home / Self Care | Payer: Medicare Other | Source: Ambulatory Visit | Attending: Internal Medicine | Admitting: Internal Medicine

## 2011-08-02 DIAGNOSIS — Z923 Personal history of irradiation: Secondary | ICD-10-CM | POA: Insufficient documentation

## 2011-08-02 DIAGNOSIS — R0602 Shortness of breath: Secondary | ICD-10-CM | POA: Insufficient documentation

## 2011-08-02 DIAGNOSIS — R0789 Other chest pain: Secondary | ICD-10-CM | POA: Insufficient documentation

## 2011-08-02 DIAGNOSIS — C3490 Malignant neoplasm of unspecified part of unspecified bronchus or lung: Secondary | ICD-10-CM | POA: Insufficient documentation

## 2011-08-02 MED ORDER — IOHEXOL 300 MG/ML  SOLN
80.0000 mL | Freq: Once | INTRAMUSCULAR | Status: AC | PRN
  Administered 2011-08-02: 80 mL via INTRAVENOUS

## 2012-07-05 ENCOUNTER — Other Ambulatory Visit: Payer: Self-pay | Admitting: Internal Medicine

## 2012-07-05 DIAGNOSIS — R7989 Other specified abnormal findings of blood chemistry: Secondary | ICD-10-CM

## 2012-07-10 ENCOUNTER — Ambulatory Visit
Admission: RE | Admit: 2012-07-10 | Discharge: 2012-07-10 | Disposition: A | Discharge status: Home / Self Care | Payer: Medicare Other | Source: Ambulatory Visit | Attending: Internal Medicine | Admitting: Internal Medicine

## 2012-07-10 DIAGNOSIS — R7989 Other specified abnormal findings of blood chemistry: Secondary | ICD-10-CM

## 2013-01-09 ENCOUNTER — Other Ambulatory Visit (HOSPITAL_COMMUNITY): Payer: Self-pay | Admitting: Internal Medicine

## 2013-01-17 ENCOUNTER — Ambulatory Visit (HOSPITAL_COMMUNITY)
Admission: RE | Admit: 2013-01-17 | Discharge: 2013-01-17 | Disposition: A | Discharge status: Home / Self Care | Payer: Medicare Other | Source: Ambulatory Visit | Attending: Internal Medicine | Admitting: Internal Medicine

## 2013-01-17 ENCOUNTER — Encounter (HOSPITAL_COMMUNITY): Payer: Self-pay

## 2013-01-17 DIAGNOSIS — I889 Nonspecific lymphadenitis, unspecified: Secondary | ICD-10-CM | POA: Insufficient documentation

## 2013-01-17 DIAGNOSIS — C3490 Malignant neoplasm of unspecified part of unspecified bronchus or lung: Secondary | ICD-10-CM | POA: Insufficient documentation

## 2013-01-17 DIAGNOSIS — Z923 Personal history of irradiation: Secondary | ICD-10-CM | POA: Insufficient documentation

## 2013-01-17 DIAGNOSIS — K7689 Other specified diseases of liver: Secondary | ICD-10-CM | POA: Insufficient documentation

## 2013-01-17 DIAGNOSIS — R911 Solitary pulmonary nodule: Secondary | ICD-10-CM | POA: Insufficient documentation

## 2013-01-17 DIAGNOSIS — Z9221 Personal history of antineoplastic chemotherapy: Secondary | ICD-10-CM | POA: Insufficient documentation

## 2013-01-17 HISTORY — DX: Malignant (primary) neoplasm, unspecified: C80.1

## 2013-01-17 MED ORDER — IOHEXOL 300 MG/ML  SOLN
80.0000 mL | Freq: Once | INTRAMUSCULAR | Status: AC | PRN
  Administered 2013-01-17: 80 mL via INTRAVENOUS

## 2013-03-06 ENCOUNTER — Ambulatory Visit (HOSPITAL_COMMUNITY)
Admission: RE | Admit: 2013-03-06 | Discharge: 2013-03-06 | Disposition: A | Discharge status: Home / Self Care | Payer: Medicare Other | Source: Ambulatory Visit | Attending: Internal Medicine | Admitting: Internal Medicine

## 2013-03-06 ENCOUNTER — Other Ambulatory Visit (HOSPITAL_COMMUNITY): Payer: Self-pay | Admitting: Internal Medicine

## 2013-03-06 DIAGNOSIS — S838X9A Sprain of other specified parts of unspecified knee, initial encounter: Secondary | ICD-10-CM

## 2013-03-06 DIAGNOSIS — S86819A Strain of other muscle(s) and tendon(s) at lower leg level, unspecified leg, initial encounter: Secondary | ICD-10-CM

## 2013-03-06 DIAGNOSIS — S93409A Sprain of unspecified ligament of unspecified ankle, initial encounter: Secondary | ICD-10-CM

## 2013-03-06 DIAGNOSIS — M25579 Pain in unspecified ankle and joints of unspecified foot: Secondary | ICD-10-CM | POA: Insufficient documentation

## 2013-03-06 DIAGNOSIS — M25569 Pain in unspecified knee: Secondary | ICD-10-CM | POA: Insufficient documentation

## 2013-03-06 DIAGNOSIS — S82109A Unspecified fracture of upper end of unspecified tibia, initial encounter for closed fracture: Secondary | ICD-10-CM | POA: Insufficient documentation

## 2013-03-06 DIAGNOSIS — M25469 Effusion, unspecified knee: Secondary | ICD-10-CM | POA: Insufficient documentation

## 2013-03-06 DIAGNOSIS — M7730 Calcaneal spur, unspecified foot: Secondary | ICD-10-CM | POA: Insufficient documentation

## 2013-04-11 ENCOUNTER — Other Ambulatory Visit: Payer: Self-pay | Admitting: Internal Medicine

## 2013-04-11 DIAGNOSIS — R591 Generalized enlarged lymph nodes: Secondary | ICD-10-CM

## 2013-04-11 DIAGNOSIS — R599 Enlarged lymph nodes, unspecified: Secondary | ICD-10-CM

## 2013-04-12 ENCOUNTER — Ambulatory Visit
Admission: RE | Admit: 2013-04-12 | Discharge: 2013-04-12 | Disposition: A | Discharge status: Home / Self Care | Payer: Medicare Other | Source: Ambulatory Visit | Attending: Internal Medicine | Admitting: Internal Medicine

## 2013-04-12 DIAGNOSIS — R591 Generalized enlarged lymph nodes: Secondary | ICD-10-CM

## 2013-04-12 DIAGNOSIS — R599 Enlarged lymph nodes, unspecified: Secondary | ICD-10-CM

## 2013-07-04 ENCOUNTER — Encounter: Payer: Self-pay | Admitting: Emergency Medicine

## 2013-07-12 ENCOUNTER — Encounter: Payer: Self-pay | Admitting: Internal Medicine

## 2013-07-12 ENCOUNTER — Ambulatory Visit: Payer: Self-pay | Admitting: Internal Medicine

## 2013-07-12 VITALS — BP 122/80 | HR 60 | Temp 98.4°F | Resp 18 | Ht 72.0 in | Wt 187.6 lb

## 2013-07-12 DIAGNOSIS — E559 Vitamin D deficiency, unspecified: Secondary | ICD-10-CM

## 2013-07-12 DIAGNOSIS — E782 Mixed hyperlipidemia: Secondary | ICD-10-CM

## 2013-07-12 DIAGNOSIS — E039 Hypothyroidism, unspecified: Secondary | ICD-10-CM

## 2013-07-12 DIAGNOSIS — J701 Chronic and other pulmonary manifestations due to radiation: Secondary | ICD-10-CM

## 2013-07-12 DIAGNOSIS — R7309 Other abnormal glucose: Secondary | ICD-10-CM

## 2013-07-12 DIAGNOSIS — Z79899 Other long term (current) drug therapy: Secondary | ICD-10-CM

## 2013-07-12 DIAGNOSIS — J449 Chronic obstructive pulmonary disease, unspecified: Secondary | ICD-10-CM

## 2013-07-12 LAB — HEPATIC FUNCTION PANEL
ALT: 13 U/L (ref 0–53)
AST: 25 U/L (ref 0–37)
Albumin: 3.8 g/dL (ref 3.5–5.2)
Alkaline Phosphatase: 70 U/L (ref 39–117)
Bilirubin, Direct: 0.1 mg/dL (ref 0.0–0.3)
Indirect Bilirubin: 0.4 mg/dL (ref 0.0–0.9)
Total Bilirubin: 0.5 mg/dL (ref 0.3–1.2)
Total Protein: 6.4 g/dL (ref 6.0–8.3)

## 2013-07-12 LAB — MAGNESIUM: Magnesium: 1.9 mg/dL (ref 1.5–2.5)

## 2013-07-12 LAB — CBC WITH DIFFERENTIAL/PLATELET
Basophils Absolute: 0 10*3/uL (ref 0.0–0.1)
Basophils Relative: 1 % (ref 0–1)
Eosinophils Absolute: 0.2 10*3/uL (ref 0.0–0.7)
Eosinophils Relative: 2 % (ref 0–5)
HCT: 38.9 % — ABNORMAL LOW (ref 39.0–52.0)
Hemoglobin: 13.4 g/dL (ref 13.0–17.0)
Lymphocytes Relative: 24 % (ref 12–46)
Lymphs Abs: 1.9 10*3/uL (ref 0.7–4.0)
MCH: 32.4 pg (ref 26.0–34.0)
MCHC: 34.4 g/dL (ref 30.0–36.0)
MCV: 94.2 fL (ref 78.0–100.0)
Monocytes Absolute: 0.8 10*3/uL (ref 0.1–1.0)
Monocytes Relative: 10 % (ref 3–12)
Neutro Abs: 5.2 10*3/uL (ref 1.7–7.7)
Neutrophils Relative %: 63 % (ref 43–77)
Platelets: 260 10*3/uL (ref 150–400)
RBC: 4.13 MIL/uL — ABNORMAL LOW (ref 4.22–5.81)
RDW: 13.6 % (ref 11.5–15.5)
WBC: 8.1 10*3/uL (ref 4.0–10.5)

## 2013-07-12 LAB — LIPID PANEL
Cholesterol: 176 mg/dL (ref 0–200)
HDL: 62 mg/dL (ref >39)
LDL Cholesterol: 95 mg/dL (ref 0–99)
Total CHOL/HDL Ratio: 2.8 Ratio
Triglycerides: 94 mg/dL (ref <150)
VLDL: 19 mg/dL (ref 0–40)

## 2013-07-12 LAB — BASIC METABOLIC PANEL WITH GFR
BUN: 13 mg/dL (ref 6–23)
CO2: 28 mEq/L (ref 19–32)
Calcium: 9.2 mg/dL (ref 8.4–10.5)
Chloride: 102 mEq/L (ref 96–112)
Creat: 0.93 mg/dL (ref 0.50–1.35)
GFR, Est African American: >89 mL/min
GFR, Est Non African American: 86 mL/min
Glucose, Bld: 77 mg/dL (ref 70–99)
Potassium: 4.4 mEq/L (ref 3.5–5.3)
Sodium: 140 mEq/L (ref 135–145)

## 2013-07-12 LAB — HEMOGLOBIN A1C
Hgb A1c MFr Bld: 5.4 % (ref <5.7)
Mean Plasma Glucose: 108 mg/dL (ref <117)

## 2013-07-12 MED ORDER — LEVOTHYROXINE SODIUM 125 MCG PO TABS: 125.0000 ug | ORAL_TABLET | ORAL | Status: DC

## 2013-07-12 NOTE — Patient Instructions (Signed)
Continue medicines same pending lab results  Chronic Obstructive Pulmonary Disease Chronic obstructive pulmonary disease (COPD) is a condition in which airflow from the lungs is restricted. The lungs can never return to normal, but there are measures you can take which will improve them and make you feel better. CAUSES   Smoking.  Exposure to secondhand smoke.  Breathing in irritants such as air pollution, dust, cigarette smoke, strong odors, aerosol sprays, or paint fumes.  History of lung infections. SYMPTOMS   Deep, persistent (chronic) cough with a large amount of thick mucus.  Wheezing.  Shortness of breath, especially with physical activity.  Feeling like you cannot get enough air.  Difficulty breathing.  Rapid breaths (tachypnea).  Gray or bluish discoloration (cyanosis) of the skin, especially in fingers, toes, or lips.  Fatigue.  Weight loss.  Swelling in legs, ankles, or feet.  Fast heartbeat (tachycardia).  Frequent lung infections.   Chest tightness. DIAGNOSIS  Initial diagnosis may be based on your history, symptoms, and physical examination. Additional tests for COPD may include:  Chest X-ray.  Computed tomography (CT) scan.  Lung (pulmonary) function tests.  Blood tests. TREATMENT  Treatment focuses on making you comfortable (supportive care). Your caregiver may prescribe medicines (inhaled or pills) to help improve your breathing. Additional treatment options may include oxygen therapy and pulmonary rehabilitation. Treatment should also include reducing your exposure to known irritants and following a plan to stop smoking. HOME CARE INSTRUCTIONS   Take all medicines, including antibiotic medicines, as directed by your caregiver.  Use inhaled medicines as directed by your caregiver.  Avoid medicines or cough syrups that dry up your airway (antihistamines) and slow down the elimination of secretions. This decreases respiratory capacity and may  lead to infections.  If you smoke, stop smoking.  Avoid exposure to smoke, chemicals, and fumes that aggravate your breathing.  Avoid contact with individuals that have a contagious illness.  Avoid extreme temperature and humidity changes.  Use humidifiers at home and at your bedside if they do not make breathing difficult.  Drink enough water and fluids to keep your urine clear or pale yellow. This loosens secretions.  Eat healthy foods. Eating smaller, more frequent meals and resting before meals may help you maintain your strength.  Ask your caregiver about the use of vitamins and mineral supplements.  Stay active. Exercise and physical activity will help maintain your ability to do things you want to do.  Balance activity with periods of rest.  Assume a position of comfort if you become short of breath.  Learn and use relaxation techniques.  Learn and use controlled breathing techniques as directed by your caregiver. Controlled breathing techniques include:  Pursed lip breathing. This breathing technique starts with breathing in (inhaling) through your nose for 1 second. Next, purse your lips as if you were going to whistle. Then breathe out (exhale) through the pursed lips for 2 seconds.  Diaphragmatic breathing. Start by putting one hand on your abdomen just above your waist. Inhale slowly through your nose. The hand on your abdomen should move out. Then exhale slowly through pursed lips. You should be able to feel the hand on your abdomen moving in as you exhale.  Learn and use controlled coughing to clear mucus from your lungs. Controlled coughing is a series of short, progressive coughs. The steps of controlled coughing are: 1. Lean your head slightly forward. 2. Breathe in deeply using diaphragmatic breathing. 3. Try to hold your breath for 3 seconds. 4. Keep  your mouth slightly open while coughing twice. 5. Spit any mucus out into a tissue. 6. Rest and repeat the  steps once or twice as needed.  Receive all protective vaccines your caregiver suggests, especially pneumococcal and influenza vaccines.  Learn to manage stress.  Schedule and attend all follow-up appointments as directed by your caregiver. It is important to keep all your appointments.  Participate in pulmonary rehabilitation as directed by your caregiver.  Use home oxygen as suggested. SEEK MEDICAL CARE IF:   You are coughing up more mucus than usual.  There is a change in the color or thickness of the mucus.  Breathing is more labored than usual.  Your breathing is faster than usual.  Your skin color is more cyanotic than usual.  You are running out of the medicine you take for your breathing.  You are anxious, apprehensive, or restless.  You have a fever. SEEK IMMEDIATE MEDICAL CARE IF:   You have a rapid heart rate.  You have shortness of breath while you are resting.  You have shortness of breath that prevents you from being able to talk.  You have shortness of breath that prevents you from performing your usual physical activities.  You have chest pain lasting longer than 5 minutes.  You have a seizure.  Your family or friends notice that you are agitated or confused. MAKE SURE YOU:   Understand these instructions.  Will watch your condition.  Will get help right away if you are not doing well or get worse. Document Released: 06/01/2005 Document Revised: 05/16/2012 Document Reviewed: 04/18/2013 [facility] Patient Information 2014 [place], [place].  Hypothyroidism The thyroid is a large gland located in the lower front of your neck. The thyroid gland helps control metabolism. Metabolism is how your body handles food. It controls metabolism with the hormone thyroxine. When this gland is underactive (hypothyroid), it produces too little hormone.  CAUSES These include:   Absence or destruction of thyroid tissue.  Goiter due to iodine deficiency.  Goiter  due to medications.  Congenital defects (since birth).  Problems with the pituitary. This causes a lack of TSH (thyroid stimulating hormone). This hormone tells the thyroid to turn out more hormone. SYMPTOMS  Lethargy (feeling as though you have no energy)  Cold intolerance  Weight gain (in spite of normal food intake)  Dry skin  Coarse hair  Menstrual irregularity (if severe, may lead to infertility)  Slowing of thought processes Cardiac problems are also caused by insufficient amounts of thyroid hormone. Hypothyroidism in the newborn is cretinism, and is an extreme form. It is important that this form be treated adequately and immediately or it will lead rapidly to retarded physical and mental development. DIAGNOSIS  To prove hypothyroidism, your caregiver may do blood tests and ultrasound tests. Sometimes the signs are hidden. It may be necessary for your caregiver to watch this illness with blood tests either before or after diagnosis and treatment. TREATMENT  Low levels of thyroid hormone are increased by using synthetic thyroid hormone. This is a safe, effective treatment. It usually takes about four weeks to gain the full effects of the medication. After you have the full effect of the medication, it will generally take another four weeks for problems to leave. Your caregiver may start you on low doses. If you have had heart problems the dose may be gradually increased. It is generally not an emergency to get rapidly to normal. HOME CARE INSTRUCTIONS   Take your medications as your caregiver  suggests. Let your caregiver know of any medications you are taking or start taking. Your caregiver will help you with dosage schedules.  As your condition improves, your dosage needs may increase. It will be necessary to have continuing blood tests as suggested by your caregiver.  Report all suspected medication side effects to your caregiver. SEEK MEDICAL CARE IF: Seek medical care if  you develop:  Sweating.  Tremulousness (tremors).  Anxiety.  Rapid weight loss.  Heat intolerance.  Emotional swings.  Diarrhea.  Weakness. SEEK IMMEDIATE MEDICAL CARE IF:  You develop chest pain, an irregular heart beat (palpitations), or a rapid heart beat. MAKE SURE YOU:   Understand these instructions.  Will watch your condition.  Will get help right away if you are not doing well or get worse. Document Released: 08/22/2005 Document Revised: 11/14/2011 Document Reviewed: 04/11/2008 [facility] Patient Information 2014 [place], [place].

## 2013-07-12 NOTE — Progress Notes (Signed)
Patient ID: Christopher Peck, male   DOB: 1946/07/21, 67 y.o.   MRN: [id number]  HPI: This very nice 67 yo MWM presents for 3 month follow up with hypertension, hyperlipidemia, pre-diabetes and vitamin D deficiency.   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 Patient uses 1/2 dose nebulizer bid because of intolerance to the medicine. He does report benefit of the dose as compared to his MDI. He denies cough or sputum production, but admits DOE so that he "paces" himself in his daily activities.   He does has remote Hx/o elevated BP for which he has ben followed expectantly. Today's BP is 128/80. Patient denies any cardiac type chest pain, dizziness, palpitations, orthopnea/PND, claudication, or dependent edema.  Also, prior Hx/o elevated LDL124 in April, 2012 with last LDL 98 in Nov 2012 which is controlled with diet & no meds. Patient denies myalgias or other med SE's.   Also, patient has history of abnormal glucose in the remote past. Patient denies any visual blurring, diabetic polys or paresthesias.  Further, Patient has history of vitamin D deficiency (D=34 in 2008) with last vit D 67in Nov 2013. pt supplementing same   Current outpatient prescriptions:albuterol (ACCUNEB) 1.25 MG/3ML nebulizer solution, Take 1 ampule by nebulization every 6 (six) hours as needed for wheezing., Disp: , Rfl: ;  cholecalciferol (VITAMIN D) 1000 UNITS tablet, Take 1,000 Units by mouth daily., Disp: , Rfl: ;  citalopram (CELEXA) 40 MG tablet, Take 40 mg by mouth daily., Disp: , Rfl: ;  finasteride (PROSCAR) 5 MG tablet, , Disp: , Rfl:  levothyroxine (SYNTHROID, LEVOTHROID) 125 MCG tablet, Take 125 mcg by mouth. 1/2 daily, Disp: , Rfl: ;  Magnesium 500 MG TABS, Take by mouth. daily, Disp: , Rfl: ;  PROAIR HFA 108 (90 BASE) MCG/ACT inhaler, , Disp: , Rfl: ;  traMADol (ULTRAM) 50 MG tablet, , Disp: , Rfl:   Allergies  Allergen Reactions   . Spiriva [Tiotropium Bromide Monohydrate]     Systems Review: Constitutional: Denies fever, chills, wt changes, headaches, insomnia, fatigue, night sweats, change in appetite. Eyes: Denies redness, blurred vision, diplopia, discharge, itchy, watery eyes.  ENT: Denies discharge, congestion, post nasal drip, epistaxis, sore throat, earache, hearing loss, dental pain, Tinnitus, Vertigo, Sinus pain, snoring.  CV: Denies chest pain, palpitations, irregular heartbeat, syncope, dyspnea, diaphoresis, orthopnea, PND, claudication, edema Respiratory: denies cough, sputum, pleurisy, hoarseness, laryngitis, wheezing. + DOE as above. Gastrointestinal: Denies dysphagia, odynophagia, heartburn, reflux, water brash, pain, cramps, nausea, vomiting, bloating, diarrhea, constipation, hematemesis, melena, hematochezia, jaundice, hemorrhoids Genitourinary: Denies dysuria, frequency, urgency, nocturia, hesitancy, discharge, hematuria, flank pain Musculoskeletal: Denies arthralgias, myalgias, stiffness, Jt. Swelling, pain, limp, strain/sprain.  Skin: Denies pruritus, rash, hives, warts, acne, eczema, change in skin lesion(s) Neuro: No weakness, tremor, incoordination, spasms, paresthesia, pain Psychiatric: Denies confusion, memory loss, sensory loss Endo: Denies change in weight, skin, hair change, nocturia, diabetic polys, paresthesias, visual blurring, hyper/hypo-glycemic episodes.  Heme/Lymph: Excessive bleeding, bruising, enlarged lymph nodes  PMHx: FHx: Reviewed / unchanged SHx: Reviewed / unchanged Filed Vitals:   07/12/13 1621  BP: 122/80  Pulse: 60  Temp: 98.4 F (36.9 C)  Resp: 18   Estimated body mass index is 25.44 kg/(m^2) as calculated from the following:   Height as of this encounter: 6' (1.829 m).   Weight as of this encounter: 187 lb 9.6 oz (85.095 kg).  On Exam: Appears well nourished - in NAD. Eyes: PERRLA,  EOMs, conjunctiva no swelling or erythema, normal fundi and  vessels. Sinuses: No Frontal/maxillary tenderness ENT/Mouth: EAC's clear, TM's nl w/o erythema, bulging. Nares clear w/o erythema, swelling, exudates. Oropharynx clear. No ulcers, cracking, on lips. Dentition - unremarkable. No erythema. Tongue normal, non obstructing. Hearing intact.  Neck: Supple. Car 2+/2+. Thyroid nl. No LN, bruits or JVD. Chest: Respirations nl, clear  equal BS w/o  rales, rhonchi, wheezing or stridor.  Cardio: Heart sounds normal w/ regular rate and rhythm without sig. murmurs, gallops,clicks, or rubs. Peripheral pulses normal and equal  without edema.  Abdomen: Soft, flat & bowel sounds normal. Non-tender w/o guarding, rebound, hernias, masses, or organomegaly.  Lymphatics: Non tender without lymphadenopathy.  Musculoskeletal: Full ROM all peripheral extremities, joint stability, 5/5 strength, and normal gait.  Skin: Warm, dry without rashes, lesions, ecchymosis.  Neuro: Cranial nerves intact, reflexes equal bilaterally. Sensory-motor testing grossly intact. Tendon reflexes grossly intact.  Pysch: Alert & oriented x 3. Insight and judgement nl & appropriate. No ideations.  Assessment and Plan:  1. Hypertension - Continue medication, monitor blood pressure at home. Continue diet/meds.  2. Hyperlipidemia - Continue diet/meds, exercise,& lifestyle modifications. Continue monitor cholesterol/LFT's.   3. Pre-diabetes/Insulin Resistance - Continue diet, exercise, lifestyle modifications. Monitor appropriate labs.   4. Vitamin D Deficiency - Continue supplementation  Continue diet & medications same. Further disposition pending lab results.

## 2013-07-13 LAB — INSULIN, FASTING: Insulin fasting, serum: 7 u[IU]/mL (ref 3–28)

## 2013-07-13 LAB — VITAMIN D 25 HYDROXY (VIT D DEFICIENCY, FRACTURES): Vit D, 25-Hydroxy: 35 ng/mL (ref 30–89)

## 2013-07-13 LAB — TSH: TSH: 3.377 u[IU]/mL (ref 0.350–4.500)

## 2013-07-24 ENCOUNTER — Other Ambulatory Visit: Payer: Self-pay | Admitting: Emergency Medicine

## 2013-09-11 ENCOUNTER — Other Ambulatory Visit: Payer: Self-pay | Admitting: Emergency Medicine

## 2013-09-20 ENCOUNTER — Other Ambulatory Visit: Payer: Self-pay | Admitting: Emergency Medicine

## 2013-10-24 ENCOUNTER — Other Ambulatory Visit: Payer: Self-pay | Admitting: Physician Assistant

## 2013-12-10 ENCOUNTER — Other Ambulatory Visit: Payer: Self-pay | Admitting: Emergency Medicine

## 2013-12-23 ENCOUNTER — Other Ambulatory Visit: Payer: Self-pay | Admitting: Internal Medicine

## 2014-01-15 ENCOUNTER — Ambulatory Visit (INDEPENDENT_AMBULATORY_CARE_PROVIDER_SITE_OTHER): Payer: Medicare Other | Admitting: Internal Medicine

## 2014-01-15 ENCOUNTER — Encounter: Payer: Self-pay | Admitting: Internal Medicine

## 2014-01-15 VITALS — BP 122/88 | HR 62 | Temp 98.4°F | Resp 16 | Ht 71.5 in | Wt 204.0 lb

## 2014-01-15 DIAGNOSIS — E782 Mixed hyperlipidemia: Secondary | ICD-10-CM

## 2014-01-15 DIAGNOSIS — R7303 Prediabetes: Secondary | ICD-10-CM | POA: Insufficient documentation

## 2014-01-15 DIAGNOSIS — Z1212 Encounter for screening for malignant neoplasm of rectum: Secondary | ICD-10-CM

## 2014-01-15 DIAGNOSIS — Z1331 Encounter for screening for depression: Secondary | ICD-10-CM

## 2014-01-15 DIAGNOSIS — C3410 Malignant neoplasm of upper lobe, unspecified bronchus or lung: Secondary | ICD-10-CM

## 2014-01-15 DIAGNOSIS — R7309 Other abnormal glucose: Secondary | ICD-10-CM

## 2014-01-15 DIAGNOSIS — R0989 Other specified symptoms and signs involving the circulatory and respiratory systems: Secondary | ICD-10-CM | POA: Insufficient documentation

## 2014-01-15 DIAGNOSIS — E559 Vitamin D deficiency, unspecified: Secondary | ICD-10-CM

## 2014-01-15 DIAGNOSIS — Z789 Other specified health status: Secondary | ICD-10-CM

## 2014-01-15 DIAGNOSIS — Z85118 Personal history of other malignant neoplasm of bronchus and lung: Secondary | ICD-10-CM | POA: Insufficient documentation

## 2014-01-15 DIAGNOSIS — Z79899 Other long term (current) drug therapy: Secondary | ICD-10-CM

## 2014-01-15 DIAGNOSIS — I10 Essential (primary) hypertension: Secondary | ICD-10-CM

## 2014-01-15 DIAGNOSIS — E039 Hypothyroidism, unspecified: Secondary | ICD-10-CM | POA: Insufficient documentation

## 2014-01-15 DIAGNOSIS — Z0000 Encounter for general adult medical examination without abnormal findings: Secondary | ICD-10-CM

## 2014-01-15 DIAGNOSIS — J4489 Other specified chronic obstructive pulmonary disease: Secondary | ICD-10-CM

## 2014-01-15 DIAGNOSIS — Z125 Encounter for screening for malignant neoplasm of prostate: Secondary | ICD-10-CM

## 2014-01-15 DIAGNOSIS — N320 Bladder-neck obstruction: Secondary | ICD-10-CM | POA: Insufficient documentation

## 2014-01-15 DIAGNOSIS — J449 Chronic obstructive pulmonary disease, unspecified: Secondary | ICD-10-CM

## 2014-01-15 HISTORY — DX: Vitamin D deficiency, unspecified: E55.9

## 2014-01-15 LAB — CBC WITH DIFFERENTIAL/PLATELET
Basophils Absolute: 0.1 10*3/uL (ref 0.0–0.1)
Basophils Relative: 1 % (ref 0–1)
Eosinophils Absolute: 0.2 10*3/uL (ref 0.0–0.7)
Eosinophils Relative: 2 % (ref 0–5)
HCT: 42.7 % (ref 39.0–52.0)
Hemoglobin: 14.3 g/dL (ref 13.0–17.0)
Lymphocytes Relative: 26 % (ref 12–46)
Lymphs Abs: 2 10*3/uL (ref 0.7–4.0)
MCH: 31.8 pg (ref 26.0–34.0)
MCHC: 33.5 g/dL (ref 30.0–36.0)
MCV: 95.1 fL (ref 78.0–100.0)
Monocytes Absolute: 0.8 10*3/uL (ref 0.1–1.0)
Monocytes Relative: 10 % (ref 3–12)
Neutro Abs: 4.8 10*3/uL (ref 1.7–7.7)
Neutrophils Relative %: 61 % (ref 43–77)
Platelets: 243 10*3/uL (ref 150–400)
RBC: 4.49 MIL/uL (ref 4.22–5.81)
RDW: 13.7 % (ref 11.5–15.5)
WBC: 7.8 10*3/uL (ref 4.0–10.5)

## 2014-01-15 LAB — HEMOGLOBIN A1C
Hgb A1c MFr Bld: 5.2 % (ref <5.7)
Mean Plasma Glucose: 103 mg/dL (ref <117)

## 2014-01-15 NOTE — Progress Notes (Signed)
Patient ID: Christopher Peck, male   DOB: Jan 04, 1946, 68 y.o.   MRN: [id number]   Annual Screening Comprehensive Examination  This very nice 68 y.o.  MWM presents for complete physical.  Patient has been followed for labile HTN, screening for Prediabetes, Hyperlipidemia, Hypothyroidism and Vitamin D Deficiency.    Patient was dx'd in 2006 with a small cell Lung Cancer which was treated with radiation and followed in 2007 with Chemotherapy. As an unfortunate consequence he did develop radiation fibrosis of the left lung, but fortunately has not had a recurrence of the cancer.  CTs of [place] last May 2014 was neg. He did develop soft tissue damage around the scapula and reports intermittant moderately severe pain up to 10/10 at it's peak and has been able to tolerate this with 2 or 3 Tramadol per day. 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 He states he is able to "pace himself" to avoid severe dyspnea.   Patient had one elevated BP years ago and has since been monitored expectantly with BP's remaining in the normal range.  Patient's BP has been controlled at home.Today's BP: 122/88 mmHg. Patient denies any cardiac symptoms as chest pain, palpitations, shortness of breath, dizziness or ankle swelling.   Patient did have an elevated LDL chol of 124 in the past which improved to 98 with better diet.  Last lipid profile at goal in Nov as below.  Lab Results  Component Value Date   CHOL 176 07/12/2013   HDL 62 07/12/2013   LDLCALC 95 07/12/2013   TRIG 94 07/12/2013   CHOLHDL 2.8 07/12/2013    Patient is screened for  Prediabetes & insulin resistance with normal values in th past. Patient denies reactive hypoglycemic symptoms, visual blurring, diabetic polys or paresthesias.    Finally, patient has history of Vitamin D Deficiency of 34 in 2008 and last vitamin D was still low at 7 in Nov 2014.  Medication Sig  . albuterol  1.25 MG/3ML neb soln Take 1 ampule by neb every  6  hours as needed   . cholecalciferol (VITAMIN D) 1000 U Take 1,000 Units by mouth daily.  . citalopram (CELEXA) 40 MG tablet Take 40 mg by mouth daily.  . finasteride (PROSCAR) 5 MG tablet TAKE ONE TABLET BY MOUTH ONCE DAILY  . levothyroxine  125 MCG tablet TAKE ONE TABLET BY MOUTH ONCE DAILY  . Magnesium 500 MG TABS Take by mouth. daily  . PROAIR HFA    . traMADol (ULTRAM) 50 MG tablet TAKE ONE TABLET BY MOUTH THREE TIMES DAILY   Allergies  Allergen Reactions  . Spiriva [Tiotropium Bromide Monohydrate]    Past Medical History  Diagnosis Date  . Cancer   . Hypertension   . Hypothyroid   . COPD (chronic obstructive pulmonary disease)   . Emphysema of lung   . Hyperlipidemia   . Other abnormal glucose     No past surgical history on file.  Family History  Problem Relation Age of Onset  . COPD Father   . Kidney failure Father     History   Social History  . Marital Status: Married    Spouse Name: N/A    Number of Children: N/A  . Years of Education: N/A   Occupational History  . Patient has ben on [employer] disability since 2006 due to his pulmonary functions.   Social History Main Topics  . Smoking status: Former Smoker    Quit date:  09/05/2004  . Smokeless tobacco: Not on file  . Alcohol Use: 1.5 oz/week    3 drink(s) per week  . Drug Use: Not on file  . Sexual Activity: Not on file   Other Topics Concern  . Not on file   Social History Narrative  . No narrative on file    ROS Constitutional: Denies fever, chills, weight loss/gain, headaches, insomnia, fatigue, night sweats or change in appetite. Eyes: Denies redness, blurred vision, diplopia, discharge, itchy or watery eyes.  ENT: Denies discharge, congestion, post nasal drip, epistaxis, sore throat, earache, hearing loss, dental pain, Tinnitus, Vertigo, Sinus pain or snoring.  Cardio: Denies chest pain, palpitations, irregular heartbeat, syncope, dyspnea, diaphoresis, orthopnea, PND, claudication or  edema Respiratory: denies cough, pleurisy, hoarseness, laryngitis or wheezing, but does have dyspnea and DOE,  Gastrointestinal: Denies dysphagia, heartburn, reflux, water brash, pain, cramps, nausea, vomiting, bloating, diarrhea, constipation, hematemesis, melena, hematochezia, jaundice or hemorrhoids Genitourinary: Denies dysuria, frequency, urgency, nocturia, hesitancy, discharge, hematuria or flank pain Musculoskeletal: Denies arthralgia, myalgia, stiffness, Jt. Swelling, pain, limp or strain/sprain. Skin: Denies puritis, rash, hives, warts, acne, eczema or change in skin lesion Neuro: No weakness, tremor, incoordination, spasms, paresthesia or pain Psychiatric: Denies confusion, memory loss or sensory loss Endocrine: Denies change in weight, skin, hair change, nocturia, and paresthesia, diabetic polys, visual blurring or hyper / hypo glycemic episodes.  Heme/Lymph: No excessive bleeding, bruising or enlarged lymph nodes.  Physical Exam  BP 122/88  Pulse 62  Temp 98.4 F   Resp 16  Ht 5' 11.5"   Wt 204 lb   BMI 28.06 kg/m2 -  O2 Sat 95% at rest  General Appearance: Well nourished, in no apparent distress. Eyes: PERRLA, EOMs, conjunctiva no swelling or erythema, normal fundi and vessels. Sinuses: No frontal/maxillary tenderness ENT/Mouth: EACs patent / TMs  nl. Nares clear without erythema, swelling, mucoid exudates. Oral hygiene is good. No erythema, swelling, or exudate. Tongue normal, non-obstructing. Tonsils not swollen or erythematous. Hearing normal.  Neck: Supple, thyroid normal. No bruits, nodes or JVD. Respiratory: Respiratory effort normal.  BS equal and clear bilateral without rales, rhonci, wheezing or stridor. Cardio: Heart sounds are normal with regular rate and rhythm and no murmurs, rubs or gallops. Peripheral pulses are normal and equal bilaterally without edema. No aortic or femoral bruits. Chest: symmetric with normal excursions and percussion.  Abdomen: Flat,  soft, with bowl sounds. Nontender, no guarding, rebound, hernias, masses, or organomegaly.  Lymphatics: Non tender without lymphadenopathy.  Genitourinary: No hernias.Testes nl. DRE - prostate nl for age - smooth & firm w/o nodules. Musculoskeletal: Full ROM all peripheral extremities particularly noted around the left shoulder . athere are several Sud-cutaneous firm mo joint stability, 5/5 strength, and normal gait. Skin: Warm and dry without rashes, lesions, cyanosis, clubbing or  ecchymosis.  Neuro: Cranial nerves intact, reflexes equal bilaterally. Normal muscle tone, no cerebellar symptoms. Sensation intact.  Pysch: Awake and oriented X 3, normal affect, insight and judgment appropriate.   Assessment and Plan  1. Annual Screening Examination 2. Hypertension, Labile   3. Hyperlipidemia 4. Pre Diabetes 5. Vitamin D Deficiency 6. Hypothyroidism 7. Small Cell Lung Cancer - presumed cured 8. Radiation fibrosis Lt Lung  Continue prudent diet as discussed, weight control, BP monitoring, regular exercise, and medications as discussed.  Discussed med effects and SE's. Routine screening labs and tests as requested with regular follow-up as recommended.

## 2014-01-15 NOTE — Patient Instructions (Signed)

## 2014-01-16 LAB — MICROALBUMIN / CREATININE URINE RATIO
Creatinine, Urine: 57.7 mg/dL
Microalb Creat Ratio: 8.7 mg/g (ref 0.0–30.0)
Microalb, Ur: 0.5 mg/dL (ref 0.00–1.89)

## 2014-01-16 LAB — HEPATIC FUNCTION PANEL
ALT: 14 U/L (ref 0–53)
AST: 25 U/L (ref 0–37)
Albumin: 4 g/dL (ref 3.5–5.2)
Alkaline Phosphatase: 59 U/L (ref 39–117)
Bilirubin, Direct: 0.1 mg/dL (ref 0.0–0.3)
Indirect Bilirubin: 0.3 mg/dL (ref 0.2–1.2)
Total Bilirubin: 0.4 mg/dL (ref 0.2–1.2)
Total Protein: 6.4 g/dL (ref 6.0–8.3)

## 2014-01-16 LAB — URINALYSIS, MICROSCOPIC ONLY
Bacteria, UA: NONE SEEN
Casts: NONE SEEN
Crystals: NONE SEEN
Squamous Epithelial / LPF: NONE SEEN

## 2014-01-16 LAB — BASIC METABOLIC PANEL WITH GFR
BUN: 15 mg/dL (ref 6–23)
CO2: 26 mEq/L (ref 19–32)
Calcium: 8.9 mg/dL (ref 8.4–10.5)
Chloride: 102 mEq/L (ref 96–112)
Creat: 1.1 mg/dL (ref 0.50–1.35)
GFR, Est African American: 81 mL/min
GFR, Est Non African American: 70 mL/min
Glucose, Bld: 93 mg/dL (ref 70–99)
Potassium: 4.1 mEq/L (ref 3.5–5.3)
Sodium: 139 mEq/L (ref 135–145)

## 2014-01-16 LAB — INSULIN, FASTING: Insulin fasting, serum: 45 u[IU]/mL — ABNORMAL HIGH (ref 3–28)

## 2014-01-16 LAB — MAGNESIUM: Magnesium: 2 mg/dL (ref 1.5–2.5)

## 2014-01-16 LAB — LIPID PANEL
Cholesterol: 179 mg/dL (ref 0–200)
HDL: 55 mg/dL (ref >39)
LDL Cholesterol: 93 mg/dL (ref 0–99)
Total CHOL/HDL Ratio: 3.3 Ratio
Triglycerides: 153 mg/dL — ABNORMAL HIGH (ref <150)
VLDL: 31 mg/dL (ref 0–40)

## 2014-01-16 LAB — PSA: PSA: 0.34 ng/mL (ref <=4.00)

## 2014-01-16 LAB — TSH: TSH: 1.934 u[IU]/mL (ref 0.350–4.500)

## 2014-01-16 LAB — VITAMIN D 25 HYDROXY (VIT D DEFICIENCY, FRACTURES): Vit D, 25-Hydroxy: 38 ng/mL (ref 30–89)

## 2014-01-20 ENCOUNTER — Other Ambulatory Visit: Payer: Self-pay

## 2014-01-20 ENCOUNTER — Other Ambulatory Visit: Payer: Self-pay | Admitting: Internal Medicine

## 2014-01-20 DIAGNOSIS — C3410 Malignant neoplasm of upper lobe, unspecified bronchus or lung: Secondary | ICD-10-CM

## 2014-01-21 ENCOUNTER — Other Ambulatory Visit: Payer: Medicare Other

## 2014-01-29 ENCOUNTER — Ambulatory Visit (HOSPITAL_COMMUNITY)
Admission: RE | Admit: 2014-01-29 | Discharge: 2014-01-29 | Disposition: A | Discharge status: Home / Self Care | Payer: Medicare Other | Source: Ambulatory Visit | Attending: Internal Medicine | Admitting: Internal Medicine

## 2014-01-29 DIAGNOSIS — C3410 Malignant neoplasm of upper lobe, unspecified bronchus or lung: Secondary | ICD-10-CM | POA: Insufficient documentation

## 2014-02-06 ENCOUNTER — Other Ambulatory Visit: Payer: Self-pay | Admitting: Physician Assistant

## 2014-03-24 ENCOUNTER — Other Ambulatory Visit: Payer: Self-pay | Admitting: Internal Medicine

## 2014-04-14 ENCOUNTER — Other Ambulatory Visit: Payer: Self-pay | Admitting: Emergency Medicine

## 2014-06-10 ENCOUNTER — Other Ambulatory Visit: Payer: Self-pay | Admitting: Physician Assistant

## 2014-06-10 ENCOUNTER — Other Ambulatory Visit: Payer: Self-pay | Admitting: Internal Medicine

## 2014-06-10 MED ORDER — TRAMADOL HCL 50 MG PO TABS: ORAL_TABLET | ORAL | Status: DC

## 2014-07-15 ENCOUNTER — Other Ambulatory Visit: Payer: Self-pay | Admitting: Emergency Medicine

## 2014-07-20 NOTE — Patient Instructions (Signed)

## 2014-07-20 NOTE — Progress Notes (Signed)
Patient ID: Christopher Peck, male   DOB: 08-20-1946, 68 y.o.   MRN: [id number]  MEDICARE ANNUAL WELLNESS VISIT AND OV  Assessment:   1. Essential hypertension  - TSH  2. Hyperlipidemia, diet controlled  - Lipid panel  3. Prediabetes  - Hemoglobin A1c - Insulin, fasting  4. Vitamin D deficiency  - Vit D  25 hydroxy   5. Medication management  - CBC with Differential - BASIC METABOLIC PANEL WITH GFR - Hepatic function panel - Magnesium  6. Encounter for general adult medical examination with abnormal findings   7. Depression screen  - Negative   8. Need for prophylactic vaccination and inoculation against influenza  - Flu vaccine HIGH DOSE PF (Fluzone Tri High dose)   Plan:   During the course of the visit the patient was educated and counseled about appropriate screening and preventive services including:    Pneumococcal vaccine   Influenza vaccine  Td vaccine  Screening electrocardiogram  Bone densitometry screening  Colorectal cancer screening  Diabetes screening  Glaucoma screening  Nutrition counseling   Advanced directives: requested  Screening recommendations, referrals: Vaccinations: DT vaccine 01/03/2012 Influenza vaccine HD 07/21/2014 Pneumococcal vaccine 01/03/2012 Prevnar vaccine declined Shingles vaccine declined Hep B vaccine not indicated  Nutrition assessed and recommended  Colonoscopy refuses Recommended yearly ophthalmology/optometry visit for glaucoma screening and checkup Recommended yearly dental visit for hygiene and checkup Advanced directives - yes  Conditions/risks identified: BMI: Discussed weight loss, diet, and increase physical activity.  Increase physical activity: AHA recommends 150 minutes of physical activity a week.  Medications reviewed PreDiabetes is at goal, ACE/ARB therapy: No, Reason not on Ace Inhibitor/ARB therapy:  not indicated Urinary Incontinence is not an issue: discussed non pharmacology  and pharmacology options.  Fall risk: low- (did Fx leg last year falling off of a roof) -discussed PT, home fall assessment, medications.   Subjective:  Christopher Peck is a 68 y.o. MWM who presents for Medicare Annual Wellness Visit and complete physical.  Date of last medicare wellness visit is unknown.  He has had labile elevated blood pressure and has been monitored expectantly. His blood pressure has been controlled at home, today their BP is BP: (!) 100/58 mmHg.  Patient does have pertinent hx/o lung cancer in 2006 treated by radiation and has had no recurrence.   He does workout. He denies chest pain, shortness of breath, dizziness. He does have a hx/o long-standing left shoulder pains which are controlled with 2-3 Tramadol/day.  He is not on cholesterol medication and denies myalgias. His cholesterol is near goal. The cholesterol last visit was:  Lab Results  Component Value Date   CHOL 193 07/21/2014   HDL 52 07/21/2014   LDLCALC 103* 07/21/2014   TRIG 188* 07/21/2014   CHOLHDL 3.7 07/21/2014   He has had prediabetes for 1 years since 2014. He has been working on diet and exercise for prediabetes, and denies foot ulcerations, hyperglycemia, hypoglycemia , nausea, paresthesia of the feet, polydipsia, polyuria and visual disturbances. Last A1C in the office was:  Lab Results  Component Value Date   HGBA1C 5.8* 07/21/2014   Patient is on Vitamin D supplement.   Lab Results  Component Value Date   VD25OH 38 01/15/2014     Names of Other Physician/Practitioners you currently use: 1. [facility] here for primary care 2. Dr Gershon Crane, eye doctor, last visit 2014 3. No dentist - has dentures  Patient Care Team: Unk Pinto, MD as PCP -  General (Internal Medicine) Curt Bears, MD as Consulting Physician (Oncology) Minus Breeding, MD as Consulting Physician (Cardiology) Eulis Manly. Gershon Crane, MD as Consulting Physician  (Ophthalmology)  Medication Review: Medication Sig  . albuterol (ACCUNEB) 1.25 MG/3ML nebulizer solution Take 1 ampule by nebulization every 6 (six) hours as needed for wheezing.  . cholecalciferol (VITAMIN D) 1000 UNITS tablet Take 1,000 Units by mouth daily.  . citalopram (CELEXA) 20 MG tablet TAKE ONE TABLET BY MOUTH ONCE DAILY FOR  MOOD  . finasteride (PROSCAR) 5 MG tablet TAKE ONE TABLET BY MOUTH ONCE DAILY  . levothyroxine (SYNTHROID, LEVOTHROID) 125 MCG tablet TAKE ONE TABLET BY MOUTH ONCE DAILY  . Magnesium 500 MG TABS Take by mouth. daily  . PROAIR HFA 108 (90 BASE) MCG/ACT inhaler   . traMADol (ULTRAM) 50 MG tablet TAKE ONE TABLET BY MOUTH THREE TIMES DAILY AS NEEDED   Current Problems (verified) Patient Active Problem List   Diagnosis Date Noted  . Essential hypertension 01/15/2014  . Vitamin D deficiency 01/15/2014  . Prediabetes 01/15/2014  . Small Cell Lung Cancer (2006) 01/15/2014  . Hyperlipidemia, diet controlled 01/15/2014  . Medication management 01/15/2014  . Hypothyroidism 01/15/2014  . COPD 01/15/2014  . BPH/Prostatism 01/15/2014  . Radiation fibrosis of lung 07/12/2013  . CORONARY ATHEROSCLEROSIS NATIVE CORONARY ARTERY 05/11/2009  . RADIATION THERAPY, HX OF 05/11/2009  . INGUINAL HERNIORRHAPHIES, BILATERAL, HX OF 05/11/2009   Screening Tests Health Maintenance  Topic Date Due  . COLONOSCOPY  07/06/1998  . ZOSTAVAX  07/06/2008  . INFLUENZA VACCINE  04/05/2014  . TETANUS/TDAP  01/02/2022  . PNEUMOCOCCAL POLYSACCHARIDE VACCINE AGE 70 AND OVER  Completed   Immunization History  Administered Date(s) Administered  . Influenza, High Dose Seasonal PF 07/21/2014  . Pneumococcal Polysaccharide-23 01/03/2012  . Td 01/03/2012   Preventative care: Last colonoscopy: refuses  Prior vaccinations: DT: 01/03/2012  Influenza: HD 07/21/2014  Pneumococcal: 01/03/2012 Prevnar: declines Shingles/Zostavax: declines  History reviewed: allergies, current medications,  past family history, past medical history, past social history, past surgical history and problem list  Risk Factors: Tobacco History  Substance Use Topics  . Smoking status: Former Smoker    Quit date: 09/05/2004  . Smokeless tobacco: Not on file  . Alcohol Use: 1.5 oz/week    3 drink(s) per week   He does not smoke.  Patient is a former smoker. Are there smokers in your home (other than you)?  No  Alcohol Current alcohol use: occasional  Caffeine Current caffeine use: coffee 1 /day and tea 2 /day  Exercise Current exercise: walking  Nutrition/Diet Current diet: in general, a "healthy" diet    Cardiac risk factors: advanced age (older than 54 for men, 58 for women), dyslipidemia, hypertension, male gender and sedentary lifestyle.  Depression Screen (Note: if answer to either of the following is "Yes", a more complete depression screening is indicated)   Q1: Over the past two weeks, have you felt down, depressed or hopeless? No  Q2: Over the past two weeks, have you felt little interest or pleasure in doing things? No  Have you lost interest or pleasure in daily life? No  Do you often feel hopeless? No  Do you cry easily over simple problems? No  Activities of Daily Living In your present state of health, do you have any difficulty performing the following activities?:  Driving? No Managing money?  No Feeding yourself? No Getting from bed to chair? No Climbing a flight of stairs? No Preparing food and eating?: No Bathing or  showering? No Getting dressed: No Getting to the toilet? No Using the toilet:No Moving around from place to place: No In the past year have you fallen or had a near fall?:Yes fell off of a roof.   Are you sexually active?  Yes  Do you have more than one partner?  No  Vision Difficulties: No  Hearing Difficulties: No Do you often ask people to speak up or repeat themselves? No Do you experience ringing or noises in your ears? No Do you  have difficulty understanding soft or whispered voices? No  Cognition  Do you feel that you have a problem with memory?No  Do you often misplace items? No  Do you feel safe at home?  Yes  Advanced directives Does patient have a [place]? Yes - wife Does patient have a Living Will? Yes   Objective:     BP 100/58, pulse 68, temp 98.2 F , resp.  16, ht 5' 11.5", wt 209 lb BMI 28.75   General appearance: alert, no distress, WD/WN, male Cognitive Testing  Alert? Yes  Normal Appearance? Yes  Oriented to person? Yes  Place? Yes   Time? Yes  Recall of three objects?  Yes  Can perform simple calculations? Yes  Displays appropriate judgment? Yes  Can read the correct time from a watch/clock? Yes  HEENT: normocephalic, sclerae anicteric, TMs pearly, nares patent, no discharge or erythema, pharynx normal Oral cavity: MMM, no lesions Neck: supple, no lymphadenopathy, no thyromegaly, no masses Heart: RRR, normal S1, S2, no murmurs Lungs: CTA bilaterally, no wheezes, rhonchi, or rales Abdomen: +bs, soft, non tender, non distended, no masses, no hepatomegaly, no splenomegaly Musculoskeletal: nontender, no swelling, no obvious deformity Extremities: no edema, no cyanosis, no clubbing Pulses: 2+ symmetric, upper and lower extremities, normal cap refill Neurological: alert, oriented x 3, CN2-12 intact, strength normal upper extremities and lower extremities, sensation normal throughout, DTRs 2+ throughout, no cerebellar signs, gait normal Psychiatric: normal affect, behavior normal, pleasant   Medicare Attestation I have personally reviewed: The patient's medical and social history Their use of alcohol, tobacco or illicit drugs Their current medications and supplements The patient's functional ability including ADLs,fall risks, home safety risks, cognitive, and hearing and visual impairment Diet and physical activities Evidence for depression or mood disorders  The  patient's weight, height, BMI, and visual acuity have been recorded in the chart.  I have made referrals, counseling, and provided education to the patient based on review of the above and I have provided the patient with a written personalized care plan for preventive services.    Emanuell Morina DAVID, MD   07/21/2014

## 2014-07-21 ENCOUNTER — Encounter: Payer: Self-pay | Admitting: Internal Medicine

## 2014-07-21 ENCOUNTER — Ambulatory Visit (INDEPENDENT_AMBULATORY_CARE_PROVIDER_SITE_OTHER): Payer: Medicare Other | Admitting: Internal Medicine

## 2014-07-21 ENCOUNTER — Other Ambulatory Visit: Payer: Self-pay | Admitting: Physician Assistant

## 2014-07-21 VITALS — BP 100/58 | HR 68 | Temp 98.2°F | Resp 16 | Ht 71.5 in | Wt 209.0 lb

## 2014-07-21 DIAGNOSIS — R6889 Other general symptoms and signs: Secondary | ICD-10-CM

## 2014-07-21 DIAGNOSIS — R7303 Prediabetes: Secondary | ICD-10-CM

## 2014-07-21 DIAGNOSIS — I10 Essential (primary) hypertension: Secondary | ICD-10-CM

## 2014-07-21 DIAGNOSIS — R7309 Other abnormal glucose: Secondary | ICD-10-CM

## 2014-07-21 DIAGNOSIS — Z23 Encounter for immunization: Secondary | ICD-10-CM

## 2014-07-21 DIAGNOSIS — Z0001 Encounter for general adult medical examination with abnormal findings: Secondary | ICD-10-CM

## 2014-07-21 DIAGNOSIS — Z1331 Encounter for screening for depression: Secondary | ICD-10-CM

## 2014-07-21 DIAGNOSIS — E782 Mixed hyperlipidemia: Secondary | ICD-10-CM

## 2014-07-21 DIAGNOSIS — E559 Vitamin D deficiency, unspecified: Secondary | ICD-10-CM

## 2014-07-21 DIAGNOSIS — Z79899 Other long term (current) drug therapy: Secondary | ICD-10-CM

## 2014-07-21 LAB — HEPATIC FUNCTION PANEL
ALT: 13 U/L (ref 0–53)
AST: 21 U/L (ref 0–37)
Albumin: 3.9 g/dL (ref 3.5–5.2)
Alkaline Phosphatase: 58 U/L (ref 39–117)
Bilirubin, Direct: 0.1 mg/dL (ref 0.0–0.3)
Indirect Bilirubin: 0.4 mg/dL (ref 0.2–1.2)
Total Bilirubin: 0.5 mg/dL (ref 0.2–1.2)
Total Protein: 6.5 g/dL (ref 6.0–8.3)

## 2014-07-21 LAB — HEMOGLOBIN A1C
Hgb A1c MFr Bld: 5.8 % — ABNORMAL HIGH (ref <5.7)
Mean Plasma Glucose: 120 mg/dL — ABNORMAL HIGH (ref <117)

## 2014-07-21 LAB — CBC WITH DIFFERENTIAL/PLATELET
Basophils Absolute: 0.1 10*3/uL (ref 0.0–0.1)
Basophils Relative: 1 % (ref 0–1)
Eosinophils Absolute: 0.2 10*3/uL (ref 0.0–0.7)
Eosinophils Relative: 3 % (ref 0–5)
HCT: 43 % (ref 39.0–52.0)
Hemoglobin: 14.5 g/dL (ref 13.0–17.0)
Lymphocytes Relative: 28 % (ref 12–46)
Lymphs Abs: 1.7 10*3/uL (ref 0.7–4.0)
MCH: 32.2 pg (ref 26.0–34.0)
MCHC: 33.7 g/dL (ref 30.0–36.0)
MCV: 95.6 fL (ref 78.0–100.0)
Monocytes Absolute: 0.6 10*3/uL (ref 0.1–1.0)
Monocytes Relative: 10 % (ref 3–12)
Neutro Abs: 3.4 10*3/uL (ref 1.7–7.7)
Neutrophils Relative %: 58 % (ref 43–77)
Platelets: 233 10*3/uL (ref 150–400)
RBC: 4.5 MIL/uL (ref 4.22–5.81)
RDW: 12.7 % (ref 11.5–15.5)
WBC: 5.9 10*3/uL (ref 4.0–10.5)

## 2014-07-21 LAB — BASIC METABOLIC PANEL WITH GFR
BUN: 15 mg/dL (ref 6–23)
CO2: 28 mEq/L (ref 19–32)
Calcium: 9.2 mg/dL (ref 8.4–10.5)
Chloride: 103 mEq/L (ref 96–112)
Creat: 1.07 mg/dL (ref 0.50–1.35)
GFR, Est African American: 83 mL/min
GFR, Est Non African American: 72 mL/min
Glucose, Bld: 81 mg/dL (ref 70–99)
Potassium: 4.2 mEq/L (ref 3.5–5.3)
Sodium: 139 mEq/L (ref 135–145)

## 2014-07-21 LAB — LIPID PANEL
Cholesterol: 193 mg/dL (ref 0–200)
HDL: 52 mg/dL (ref >39)
LDL Cholesterol: 103 mg/dL — ABNORMAL HIGH (ref 0–99)
Total CHOL/HDL Ratio: 3.7 Ratio
Triglycerides: 188 mg/dL — ABNORMAL HIGH (ref <150)
VLDL: 38 mg/dL (ref 0–40)

## 2014-07-21 LAB — TSH: TSH: 1.652 u[IU]/mL (ref 0.350–4.500)

## 2014-07-21 LAB — MAGNESIUM: Magnesium: 1.9 mg/dL (ref 1.5–2.5)

## 2014-07-21 MED ORDER — TRAMADOL HCL 50 MG PO TABS: ORAL_TABLET | ORAL | Status: DC

## 2014-07-21 MED ORDER — ALBUTEROL SULFATE HFA 108 (90 BASE) MCG/ACT IN AERS: INHALATION_SPRAY | RESPIRATORY_TRACT | Status: DC

## 2014-07-21 MED ORDER — CYCLOBENZAPRINE HCL 10 MG PO TABS: ORAL_TABLET | ORAL | Status: DC

## 2014-07-22 LAB — INSULIN, FASTING: Insulin fasting, serum: 23.9 u[IU]/mL — ABNORMAL HIGH (ref 2.0–19.6)

## 2014-07-22 LAB — VITAMIN D 25 HYDROXY (VIT D DEFICIENCY, FRACTURES): Vit D, 25-Hydroxy: 29 ng/mL — ABNORMAL LOW (ref 30–100)

## 2014-11-17 DIAGNOSIS — J449 Chronic obstructive pulmonary disease, unspecified: Secondary | ICD-10-CM | POA: Diagnosis not present

## 2014-11-18 ENCOUNTER — Other Ambulatory Visit: Payer: Self-pay

## 2014-11-18 MED ORDER — LEVOTHYROXINE SODIUM 125 MCG PO TABS: 125.0000 ug | ORAL_TABLET | Freq: Every day | ORAL | Status: DC

## 2014-12-22 ENCOUNTER — Other Ambulatory Visit: Payer: Self-pay | Admitting: *Deleted

## 2014-12-22 ENCOUNTER — Other Ambulatory Visit: Payer: Self-pay | Admitting: Emergency Medicine

## 2014-12-22 MED ORDER — TIZANIDINE HCL 4 MG PO CAPS: 4.0000 mg | ORAL_CAPSULE | Freq: Three times a day (TID) | ORAL | Status: DC

## 2015-01-12 ENCOUNTER — Other Ambulatory Visit: Payer: Self-pay | Admitting: Internal Medicine

## 2015-01-19 ENCOUNTER — Encounter: Payer: Self-pay | Admitting: Internal Medicine

## 2015-01-19 ENCOUNTER — Ambulatory Visit (INDEPENDENT_AMBULATORY_CARE_PROVIDER_SITE_OTHER): Payer: Medicare Other | Admitting: Internal Medicine

## 2015-01-19 ENCOUNTER — Telehealth: Payer: Self-pay | Admitting: *Deleted

## 2015-01-19 ENCOUNTER — Encounter (INDEPENDENT_AMBULATORY_CARE_PROVIDER_SITE_OTHER): Payer: Self-pay

## 2015-01-19 VITALS — BP 118/70 | HR 56 | Temp 97.5°F | Resp 16 | Ht 72.0 in | Wt 210.0 lb

## 2015-01-19 DIAGNOSIS — R7309 Other abnormal glucose: Secondary | ICD-10-CM | POA: Diagnosis not present

## 2015-01-19 DIAGNOSIS — Z1212 Encounter for screening for malignant neoplasm of rectum: Secondary | ICD-10-CM

## 2015-01-19 DIAGNOSIS — R7303 Prediabetes: Secondary | ICD-10-CM

## 2015-01-19 DIAGNOSIS — Z125 Encounter for screening for malignant neoplasm of prostate: Secondary | ICD-10-CM

## 2015-01-19 DIAGNOSIS — E782 Mixed hyperlipidemia: Secondary | ICD-10-CM

## 2015-01-19 DIAGNOSIS — C3490 Malignant neoplasm of unspecified part of unspecified bronchus or lung: Secondary | ICD-10-CM

## 2015-01-19 DIAGNOSIS — E039 Hypothyroidism, unspecified: Secondary | ICD-10-CM | POA: Diagnosis not present

## 2015-01-19 DIAGNOSIS — I2510 Atherosclerotic heart disease of native coronary artery without angina pectoris: Secondary | ICD-10-CM

## 2015-01-19 DIAGNOSIS — R0989 Other specified symptoms and signs involving the circulatory and respiratory systems: Secondary | ICD-10-CM

## 2015-01-19 DIAGNOSIS — Z79899 Other long term (current) drug therapy: Secondary | ICD-10-CM

## 2015-01-19 DIAGNOSIS — E559 Vitamin D deficiency, unspecified: Secondary | ICD-10-CM

## 2015-01-19 DIAGNOSIS — I10 Essential (primary) hypertension: Secondary | ICD-10-CM | POA: Diagnosis not present

## 2015-01-19 DIAGNOSIS — Z1331 Encounter for screening for depression: Secondary | ICD-10-CM

## 2015-01-19 DIAGNOSIS — Z0001 Encounter for general adult medical examination with abnormal findings: Secondary | ICD-10-CM | POA: Diagnosis not present

## 2015-01-19 DIAGNOSIS — N320 Bladder-neck obstruction: Secondary | ICD-10-CM

## 2015-01-19 DIAGNOSIS — R6889 Other general symptoms and signs: Secondary | ICD-10-CM | POA: Diagnosis not present

## 2015-01-19 DIAGNOSIS — Z9181 History of falling: Secondary | ICD-10-CM

## 2015-01-19 DIAGNOSIS — J449 Chronic obstructive pulmonary disease, unspecified: Secondary | ICD-10-CM

## 2015-01-19 LAB — CBC WITH DIFFERENTIAL/PLATELET
Basophils Absolute: 0.1 10*3/uL (ref 0.0–0.1)
Basophils Relative: 1 % (ref 0–1)
Eosinophils Absolute: 0.2 10*3/uL (ref 0.0–0.7)
Eosinophils Relative: 3 % (ref 0–5)
HCT: 41.1 % (ref 39.0–52.0)
Hemoglobin: 13.9 g/dL (ref 13.0–17.0)
Lymphocytes Relative: 33 % (ref 12–46)
Lymphs Abs: 2.4 10*3/uL (ref 0.7–4.0)
MCH: 31.6 pg (ref 26.0–34.0)
MCHC: 33.8 g/dL (ref 30.0–36.0)
MCV: 93.4 fL (ref 78.0–100.0)
MPV: 9.7 fL (ref 8.6–12.4)
Monocytes Absolute: 0.7 10*3/uL (ref 0.1–1.0)
Monocytes Relative: 9 % (ref 3–12)
Neutro Abs: 4 10*3/uL (ref 1.7–7.7)
Neutrophils Relative %: 54 % (ref 43–77)
Platelets: 237 10*3/uL (ref 150–400)
RBC: 4.4 MIL/uL (ref 4.22–5.81)
RDW: 13.5 % (ref 11.5–15.5)
WBC: 7.4 10*3/uL (ref 4.0–10.5)

## 2015-01-19 LAB — TSH: TSH: 1.088 u[IU]/mL (ref 0.350–4.500)

## 2015-01-19 NOTE — Telephone Encounter (Signed)
For ct scan pt prefers the cancer ct please

## 2015-01-19 NOTE — Patient Instructions (Signed)
++++++++++++++++++++++++++++++++++  Recommend Low dose or baby Aspirin 81 mg daily   To reduce risk of Colon Cancer 20 %, Skin Cancer 26 % , Melanoma 46% and   Pancreatic cancer 60%  +++++++++++++++++++++++++++++++++ Vitamin D goal is between 70-100.   Please make sure that you are taking your Vitamin D as directed.   It is very important as a natural antiinflammatory   helping hair, skin, and nails, as well as reducing stroke and heart attack risk.   It helps your bones and helps with mood.  It also decreases numerous cancer risks so please take it as directed.   Low Vit D is associated with a 200-300% higher risk for CANCER   and 200-300% higher risk for HEART   ATTACK  &  STROKE.    ...................................................................................  It is also associated with higher death rate at younger ages,   autoimmune diseases like Rheumatoid arthritis, Lupus, Multiple Sclerosis.     Also many other serious conditions, like depression, Alzheimer's  Dementia, infertility, muscle aches, fatigue, fibromyalgia - just to name a few.  ++++++++++++++++++++++++++++++++++++++++ Recommend the book "The END of DIETING" by Dr Joel Fuhrman   & the book "The END of DIABETES " by Dr Joel Fuhrman  At [employer] - get book & Audio CD's     Being diabetic has a  300% increased risk for heart attack, stroke, cancer, and alzheimer- type vascular dementia. It is very important that you work harder with diet by avoiding all foods that are white. Avoid white rice (brown & wild rice is OK), white potatoes (sweetpotatoes in moderation is OK), White bread or wheat bread or anything made out of white flour like bagels, donuts, rolls, buns, biscuits, cakes, pastries, cookies, pizza crust, and pasta (made from white flour & egg whites) - vegetarian pasta or spinach or wheat pasta is OK. Multigrain breads like Arnold's or [employer], or multigrain sandwich thins or  flatbreads.  Diet, exercise and weight loss can reverse and cure diabetes in the early stages.  Diet, exercise and weight loss is very important in the control and prevention of complications of diabetes which affects every system in your body, ie. Brain - dementia/stroke, eyes - glaucoma/blindness, heart - heart attack/heart failure, kidneys - dialysis, stomach - gastric paralysis, intestines - malabsorption, nerves - severe painful neuritis, circulation - gangrene & loss of a leg(s), and finally cancer and Alzheimers.    I recommend avoid fried & greasy foods,  sweets/candy, white rice (brown or wild rice or Quinoa is OK), white potatoes (sweet potatoes are OK) - anything made from white flour - bagels, doughnuts, rolls, buns, biscuits,white and wheat breads, pizza crust and traditional pasta made of white flour & egg white(vegetarian pasta or spinach or wheat pasta is OK).  Multi-grain bread is OK - like multi-grain flat bread or sandwich thins. Avoid alcohol in excess. Exercise is also important.    Eat all the vegetables you want - avoid meat, especially red meat and dairy - especially cheese.  Cheese is the most concentrated form of trans-fats which is the worst thing to clog up our arteries. Veggie cheese is OK which can be found in the fresh produce section at [employer] or [employer] or [employer]  ++++++++++++++++++++++++++++++++++++++++++++++++++++++++ Preventive Care for Adults A healthy lifestyle and preventive care can promote health and wellness. Preventive health guidelines for men include the following key practices:  A routine yearly physical is a good way to check with your health care provider about your   health and preventative screening. It is a chance to share any concerns and updates on your health and to receive a thorough exam.  Visit your dentist for a routine exam and preventative care every 6 months. Brush your teeth twice a day and floss once a day. Good oral hygiene  prevents tooth decay and gum disease.  The frequency of eye exams is based on your age, health, family medical history, use of contact lenses, and other factors. Follow your health care provider's recommendations for frequency of eye exams.  Eat a healthy diet. Foods such as vegetables, fruits, whole grains, low-fat dairy products, and lean protein foods contain the nutrients you need without too many calories. Decrease your intake of foods high in solid fats, added sugars, and salt. Eat the right amount of calories for you.Get information about a proper diet from your health care provider, if necessary.  Regular physical exercise is one of the most important things you can do for your health. Most adults should get at least 150 minutes of moderate-intensity exercise (any activity that increases your heart rate and causes you to sweat) each week. In addition, most adults need muscle-strengthening exercises on 2 or more days a week.  Maintain a healthy weight. The body mass index (BMI) is a screening tool to identify possible weight problems. It provides an estimate of body fat based on height and weight. Your health care provider can find your BMI and can help you achieve or maintain a healthy weight.For adults 20 years and older:  A BMI below 18.5 is considered underweight.  A BMI of 18.5 to 24.9 is normal.  A BMI of 25 to 29.9 is considered overweight.  A BMI of 30 and above is considered obese.  Maintain normal blood lipids and cholesterol levels by exercising and minimizing your intake of saturated fat. Eat a balanced diet with plenty of fruit and vegetables. Blood tests for lipids and cholesterol should begin at age 20 and be repeated every 5 years. If your lipid or cholesterol levels are high, you are over 50, or you are at high risk for heart disease, you may need your cholesterol levels checked more frequently.Ongoing high lipid and cholesterol levels should be treated with medicines if  diet and exercise are not working.  If you smoke, find out from your health care provider how to quit. If you do not use tobacco, do not start.  Lung cancer screening is recommended for adults aged 55-80 years who are at high risk for developing lung cancer because of a history of smoking. A yearly low-dose CT scan of the lungs is recommended for people who have at least a 30-pack-year history of smoking and are a current smoker or have quit within the past 15 years. A pack year of smoking is smoking an average of 1 pack of cigarettes a day for 1 year (for example: 1 pack a day for 30 years or 2 packs a day for 15 years). Yearly screening should continue until the smoker has stopped smoking for at least 15 years. Yearly screening should be stopped for people who develop a health problem that would prevent them from having lung cancer treatment.  If you choose to drink alcohol, do not have more than 2 drinks per day. One drink is considered to be 12 ounces (355 mL) of beer, 5 ounces (148 mL) of wine, or 1.5 ounces (44 mL) of liquor.  Avoid use of street drugs. Do not share needles with anyone. Ask   for help if you need support or instructions about stopping the use of drugs.  High blood pressure causes heart disease and increases the risk of stroke. Your blood pressure should be checked at least every 1-2 years. Ongoing high blood pressure should be treated with medicines, if weight loss and exercise are not effective.  If you are 45-79 years old, ask your health care provider if you should take aspirin to prevent heart disease.  Diabetes screening involves taking a blood sample to check your fasting blood sugar level. Testing should be considered at a younger age or be carried out more frequently if you are overweight and have at least 1 risk factor for diabetes.  Colorectal cancer can be detected and often prevented. Most routine colorectal cancer screening begins at the age of 50 and continues  through age 75. However, your health care provider may recommend screening at an earlier age if you have risk factors for colon cancer. On a yearly basis, your health care provider may provide home test kits to check for hidden blood in the stool. Use of a small camera at the end of a tube to directly examine the colon (sigmoidoscopy or colonoscopy) can detect the earliest forms of colorectal cancer. Talk to your health care provider about this at age 50, when routine screening begins. Direct exam of the colon should be repeated every 5-10 years through age 75, unless early forms of precancerous polyps or small growths are found.  Hepatitis C blood testing is recommended for all people born from 1945 through 1965 and any individual with known risks for hepatitis C.  Screening for abdominal aortic aneurysm (AAA)  by ultrasound is recommended for people who have history of high blood pressure or who are current or former smokers.  Healthy men should  receive prostate-specific antigen (PSA) blood tests as part of routine cancer screening. Talk with your health care provider about prostate cancer screening.  Testicular cancer screening is  recommended for adult males. Screening includes self-exam, a health care provider exam, and other screening tests. Consult with your health care provider about any symptoms you have or any concerns you have about testicular cancer.  Use sunscreen. Apply sunscreen liberally and repeatedly throughout the day. You should seek shade when your shadow is shorter than you. Protect yourself by wearing long sleeves, pants, a wide-brimmed hat, and sunglasses year round, whenever you are outdoors.  Once a month, do a whole-body skin exam, using a mirror to look at the skin on your back. Tell your health care provider about new moles, moles that have irregular borders, moles that are larger than a pencil eraser, or moles that have changed in shape or color.  Stay current with  required vaccines (immunizations).  Influenza vaccine. All adults should be immunized every year.  Tetanus, diphtheria, and acellular pertussis (Td, Tdap) vaccine. An adult who has not previously received Tdap or who does not know his vaccine status should receive 1 dose of Tdap. This initial dose should be followed by tetanus and diphtheria toxoids (Td) booster doses every 10 years. Adults with an unknown or incomplete history of completing a 3-dose immunization series with Td-containing vaccines should begin or complete a primary immunization series including a Tdap dose. Adults should receive a Td booster every 10 years.  Zoster vaccine. One dose is recommended for adults aged 60 years or older unless certain conditions are present.    PREVNAR - Pneumococcal 13-valent conjugate (PCV13) vaccine. When indicated, a person who is   uncertain of his immunization history and has no record of immunization should receive the PCV13 vaccine. An adult aged 19 years or older who has certain medical conditions and has not been previously immunized should receive 1 dose of PCV13 vaccine. This PCV13 should be followed with a dose of pneumococcal polysaccharide (PPSV23) vaccine. The PPSV23 vaccine dose should be obtained at least 8 weeks after the dose of PCV13 vaccine. An adult aged 19 years or older who has certain medical conditions and previously received 1 or more doses of PPSV23 vaccine should receive 1 dose of PCV13. The PCV13 vaccine dose should be obtained 1 or more years after the last PPSV23 vaccine dose.    PNEUMOVAX - Pneumococcal polysaccharide (PPSV23) vaccine. When PCV13 is also indicated, PCV13 should be obtained first. All adults aged 65 years and older should be immunized. An adult younger than age 65 years who has certain medical conditions should be immunized. Any person who resides in a nursing home or long-term care facility should be immunized. An adult smoker should be immunized. People with  an immunocompromised condition and certain other conditions should receive both PCV13 and PPSV23 vaccines. People with human immunodeficiency virus (HIV) infection should be immunized as soon as possible after diagnosis. Immunization during chemotherapy or radiation therapy should be avoided. Routine use of PPSV23 vaccine is not recommended for American Indians, [place] Natives, or people younger than 65 years unless there are medical conditions that require PPSV23 vaccine. When indicated, people who have unknown immunization and have no record of immunization should receive PPSV23 vaccine. One-time revaccination 5 years after the first dose of PPSV23 is recommended for people aged 19-64 years who have chronic kidney failure, nephrotic syndrome, asplenia, or immunocompromised conditions. People who received 1-2 doses of PPSV23 before age 65 years should receive another dose of PPSV23 vaccine at age 65 years or later if at least 5 years have passed since the previous dose. Doses of PPSV23 are not needed for people immunized with PPSV23 at or after age 65 years.    Hepatitis A vaccine. Adults who wish to be protected from this disease, have certain high-risk conditions, work with hepatitis A-infected animals, work in hepatitis A research labs, or travel to or work in countries with a high rate of hepatitis A should be immunized. Adults who were previously unvaccinated and who anticipate close contact with an international adoptee during the first 60 days after arrival in the [place] from a country with a high rate of hepatitis A should be immunized.    Hepatitis B vaccine. Adults should be immunized if they wish to be protected from this disease, have certain high-risk conditions, may be exposed to blood or other infectious body fluids, are household contacts or sex partners of hepatitis B positive people, are clients or workers in certain care facilities, or travel to or work in countries with a high  rate of hepatitis B.   Preventive Service / Frequency   Ages 65 and over  Blood pressure check.  Lipid and cholesterol check.  Lung cancer screening. / Every year if you are aged 55-80 years and have a 30-pack-year history of smoking and currently smoke or have quit within the past 15 years. Yearly screening is stopped once you have quit smoking for at least 15 years or develop a health problem that would prevent you from having lung cancer treatment.  Fecal occult blood test (FOBT) of stool. You may not have to do this test if you get a   colonoscopy every 10 years.  Flexible sigmoidoscopy** or colonoscopy.** / Every 5 years for a flexible sigmoidoscopy or every 10 years for a colonoscopy beginning at age 50 and continuing until age 75.  Hepatitis C blood test.** / For all people born from 1945 through 1965 and any individual with known risks for hepatitis C.  Abdominal aortic aneurysm (AAA) screening./ Screening current or former smokers or have Hypertension.  Skin self-exam. / Monthly.  Influenza vaccine. / Every year.  Tetanus, diphtheria, and acellular pertussis (Tdap/Td) vaccine.** / 1 dose of Td every 10 years.   Zoster vaccine.** / 1 dose for adults aged 60 years or older.         Pneumococcal 13-valent conjugate (PCV13) vaccine.    Pneumococcal polysaccharide (PPSV23) vaccine.     Hepatitis A vaccine.** / Consult your health care provider.  Hepatitis B vaccine.** / Consult your health care provider. Screening for abdominal aortic aneurysm (AAA)  by ultrasound is recommended for people who have history of high blood pressure or who are current or former smokers. 

## 2015-01-19 NOTE — Progress Notes (Signed)
Patient ID: Christopher Peck, male   DOB: November 10, 1945, 69 y.o.   MRN: [id number]  [facility] VISIT AND CPE  Assessment:   1. Labile hypertension   2. Essential hypertension  - Microalbumin / creatinine urine ratio - EKG 12-Lead - [place], RETROPERITNL ABD,  LTD  3. Hyperlipidemia, diet controlled  - Lipid panel  4. Prediabetes  - Hemoglobin A1c - Insulin, random  5. Vitamin D deficiency  - Vit D  25 hydroxy (rtn osteoporosis monitoring)  6. Hypothyroidism, unspecified hypothyroidism type  - TSH  7. Atherosclerosis of native coronary artery of native heart without angina pectoris   8. BPH/Prostatism   9. Malignant neoplasm of lung,  - CT CHEST LUNG CA SCREEN LOW DOSE W/O CM; Future  - Patient was counseled of dangers of smoking   10. Screening for rectal cancer  - Refuse to consider colonoscopy or Cologard testing.  - POC Hemoccult Bld/Stl  11. Prostate cancer screening  - PSA  12. Depression screen   13. At low risk for fall   14. Chronic obstructive pulmonary disease, unspecified COPD,  - CT CHEST LUNG CA SCREEN LOW DOSE W/O CM; Future  15. Medication management  - Urine Microscopic - CBC with Differential/Platelet - BASIC METABOLIC PANEL WITH GFR - Hepatic function panel - Magnesium   Plan:   During the course of the visit the patient was educated and counseled about appropriate screening and preventive services including:    Pneumococcal vaccine   Influenza vaccine  Td vaccine  Screening electrocardiogram  Bone densitometry screening  Colorectal cancer screening  Diabetes screening  Glaucoma screening  Nutrition counseling   Advanced directives: requested  Screening recommendations, referrals: Vaccinations: Immunization History  Administered Date(s) Administered  . Influenza, High Dose Seasonal PF 07/21/2014  . Pneumococcal Polysaccharide-23 01/03/2012  . Td 01/03/2012  Prevnar vaccine will obtain in  [facility] Shingles vaccine refused Hep B vaccine not indicated  Nutrition assessed and recommended  Colonoscopy refuses Recommended yearly ophthalmology/optometry visit for glaucoma screening and checkup Recommended yearly dental visit for hygiene and checkup Advanced directives - yes  Conditions/risks identified: BMI: Discussed weight loss, diet, and increase physical activity.  Increase physical activity: AHA recommends 150 minutes of physical activity a week.  Medications reviewed PreDiabetes is near goal, ACE/ARB therapy: Not Indicated Urinary Incontinence is not an issue: discussed non pharmacology and pharmacology options.  Fall risk: low- discussed PT, home fall assessment, medications.   Subjective:    Christopher Peck  presents for Medicare Annual Wellness Visit and complete physical.  Date of last medicare wellness visit was 07/21/2015.  This very nice 69 y.o. MWM presents for 3 month follow up with Hypertension, Hyperlipidemia, Pre-Diabetes and Vitamin D Deficiency.    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 He did develop soft tissue damage around the scapula and reports intermittant moderately severe pain up to 10/10 at it's peak and has been able to tolerate this with 2 or 3 Tramadol per day. 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 He states he is able to "pace himself" to avoid severe dyspnea.   Patient is treated for HTN & BP has been controlled at home. Today's BP: 118/70 mmHg. Patient has had no complaints of any cardiac type  chest pain, palpitations, dyspnea/orthopnea/PND, dizziness, claudication, or dependent edema.   Hyperlipidemia is controlled with diet & meds. Patient denies myalgias or other med SE's. Last Lipids were 07/21/2014:  Cholesterol, Total 193; HDL-C 52; LDL (calc) 103*; Triglycerides 188*   Also, the patient has history of PreDiabetes and has had no symptoms of reactive hypoglycemia, diabetic polys, paresthesias or visual blurring.  Last A1c was 5.8% on 07/21/2014.    Further, the patient also has history of Vitamin D Deficiency of 34 in 2008 and supplements vitamin D without any suspected side-effects. Last vitamin D was  29 on 07/21/2014.  +++++++++++++++++++++++++++++++++++++++++++++++++++++++  Names of Other Physician/Practitioners you currently use: 1. [facility] here for primary care 2. Dr Gershon Crane, eye doctor, last visit 2015 3. No dentist, has dentures  Patient Care Team: Unk Pinto, MD as PCP - General (Internal Medicine) Curt Bears, MD as Consulting Physician (Oncology) Rutherford Guys, MD as Consulting Physician (Ophthalmology)  Medication Review: Medication Sig  . VITAMIN D 1000 UNITS  Take 1,000 Units by mouth daily.  . citalopram  20 MG tablet TAKE 1 TABLET BY MOUTH ONCE DAILY FOR MOOD  . finasteride  5 MG tablet TAKE 1 TABLET BY MOUTH AT BEDTIME FOR PROSTATE  . levothyroxine  125 MCG tablet Take 1 tablet (125 mcg total) by mouth daily.  . Magnesium 500 MG TABS Take by mouth. daily  . PROAIR HFA   inhaler INHALE 2 PUFFS BY MOUTH EVERY 4 HOURS AS NEEDED  . tiZANidine  4 MG capsule Take 1 capsule (4 mg total) by mouth 3 (three) times daily.  . traMADol  50 MG tablet TAKE ONE TABLET BY MOUTH THREE TIMES DAILY AS NEEDED   Current Problems (verified) Patient Active Problem List   Diagnosis Date Noted  . Essential hypertension 01/15/2014  . Vitamin D deficiency 01/15/2014  . Prediabetes 01/15/2014  . Small Cell Lung Cancer (2006) 01/15/2014  . Hyperlipidemia, diet controlled 01/15/2014  . Medication management 01/15/2014  . Hypothyroidism 01/15/2014  . COPD 01/15/2014  . BPH/Prostatism 01/15/2014  . Radiation fibrosis of lung 07/12/2013   . CORONARY ATHEROSCLEROSIS NATIVE CORONARY ARTERY 05/11/2009  . INGUINAL HERNIORRHAPHIES, BILATERAL, HX OF 05/11/2009    Screening Tests Health Maintenance  Topic Date Due  . COLONOSCOPY  07/06/1998  . ZOSTAVAX  07/06/2008  . PNA vac Low Risk Adult (1 of 2 - PCV13) 07/06/2013  . INFLUENZA VACCINE  04/06/2015  . TETANUS/TDAP  01/02/2022    Immunization History  Administered Date(s) Administered  . Influenza, High Dose Seasonal PF 07/21/2014  . Pneumococcal Polysaccharide-23 01/03/2012  . Td 01/03/2012    Preventative care: Last colonoscopy: Refuses  History reviewed: allergies, current medications, past family history, past medical history, past social history, past surgical history and problem list  Risk Factors: Tobacco History  Substance Use Topics  . Smoking status: Former Smoker    Quit date: 09/05/2004  . Smokeless tobacco: Not on file  . Alcohol Use: 1.5 oz/week    3 drink(s) per week   He does not smoke.  Patient is a former smoker and has been counseled about the dangers of smoking. Are there smokers in your home (other than you)?  No  Alcohol Current alcohol use: 3 liquor drinks per week(s)  Caffeine Current caffeine use: coffee 4-5 cups /day  Exercise Current exercise: walking and yard work  Nutrition/Diet Current diet: in general, a "healthy" diet    Cardiac risk factors: advanced age (older than 58 for men, 62  for women), dyslipidemia, hypertension, male gender, sedentary lifestyle and smoking/ tobacco exposure.  Depression Screen (Note: if answer to either of the following is "Yes", a more complete depression screening is indicated)   Q1: Over the past two weeks, have you felt down, depressed or hopeless? No  Q2: Over the past two weeks, have you felt little interest or pleasure in doing things? No  Have you lost interest or pleasure in daily life? No  Do you often feel hopeless? No  Do you cry easily over simple problems? No  Activities of  Daily Living In your present state of health, do you have any difficulty performing the following activities?:  Driving? No Managing money?  No Feeding yourself? No Getting from bed to chair? No Climbing a flight of stairs? No Preparing food and eating?: No Bathing or showering? No Getting dressed: No Getting to the toilet? No Using the toilet:No Moving around from place to place: No In the past year have you fallen or had a near fall?:No   Are you sexually active?  Yes  Do you have more than one partner?  No  Vision Difficulties: No  Hearing Difficulties: No Do you often ask people to speak up or repeat themselves? No Do you experience ringing or noises in your ears? No Do you have difficulty understanding soft or whispered voices? No  Cognition  Do you feel that you have a problem with memory?No  Do you often misplace items? No  Do you feel safe at home?  Yes  Advanced directives Does patient have a [place]? Yes Does patient have a Living Will? Yes  Past Medical History  Diagnosis Date  . Cancer   . Hypertension   . Hypothyroid   . COPD (chronic obstructive pulmonary disease)   . Emphysema of lung   . Hyperlipidemia   . Other abnormal glucose    ROS: Constitutional: Denies fever, chills, weight loss/gain, headaches, insomnia, fatigue, night sweats or change in appetite. Eyes: Denies redness, blurred vision, diplopia, discharge, itchy or watery eyes.  ENT: Denies discharge, congestion, post nasal drip, epistaxis, sore throat, earache, hearing loss, dental pain, Tinnitus, Vertigo, Sinus pain or snoring.  Cardio: Denies chest pain, palpitations, irregular heartbeat, syncope, dyspnea, diaphoresis, orthopnea, PND, claudication or edema Respiratory: denies cough, dyspnea, DOE, pleurisy, hoarseness, laryngitis or wheezing.  Gastrointestinal: Denies dysphagia, heartburn, reflux, water brash, pain, cramps, nausea, vomiting, bloating, diarrhea,  constipation, hematemesis, melena, hematochezia, jaundice or hemorrhoids Genitourinary: Denies dysuria, frequency, urgency, nocturia, hesitancy, discharge, hematuria or flank pain Musculoskeletal: Denies arthralgia, myalgia, stiffness, Jt. Swelling, pain, limp or strain/sprain. Denies Falls. Skin: Denies puritis, rash, hives, warts, acne, eczema or change in skin lesion Neuro: No weakness, tremor, incoordination, spasms, paresthesia or pain Psychiatric: Denies confusion, memory loss or sensory loss. Denies Depression. Endocrine: Denies change in weight, skin, hair change, nocturia, and paresthesia, diabetic polys, visual blurring or hyper / hypo glycemic episodes.  Heme/Lymph: No excessive bleeding, bruising or enlarged lymph nodes.  Objective:     BP 118/70   Pulse 56  Temp 97.5 F   Resp 16  Ht 6'   Wt 210 lb    BMI 28.47   General Appearance:  Alert  WD/WN, male  in no apparent distress. Eyes: PERRLA, EOMs nl, conjunctiva normal, normal fundi and vessels. Sinuses: No frontal/maxillary tenderness ENT/Mouth: EACs patent / TMs  nl. Nares clear without erythema, swelling, mucoid exudates. Oral hygiene is good. No erythema, swelling, or exudate. Tongue normal, non-obstructing. Tonsils  not swollen or erythematous. Hearing normal.  Neck: Supple, thyroid normal. No bruits, nodes or JVD. Respiratory: Respiratory effort normal.  BS equal and clear bilateral without rales, rhonci, wheezing or stridor. Cardio: Heart sounds are normal with regular rate and rhythm and no murmurs, rubs or gallops. Peripheral pulses are normal and equal bilaterally without edema. No aortic or femoral bruits. Chest: symmetric with normal excursions and percussion.  Abdomen: Flat, soft, with nl bowel sounds. Nontender, no guarding, rebound, hernias, masses, or organomegaly.  Lymphatics: Non tender without lymphadenopathy.  Genitourinary: No hernias.Testes nl. DRE - prostate nl for age - smooth & firm w/o  nodules. Musculoskeletal: Full ROM all peripheral extremities, joint stability, 5/5 strength, and normal gait. Skin: Warm and dry without rashes, lesions, cyanosis, clubbing or  ecchymosis.  Neuro: Cranial nerves intact, reflexes equal bilaterally. Normal muscle tone, no cerebellar symptoms. Sensation intact.  Pysch: Alert and oriented X 3 with normal affect, insight and judgment appropriate.   Cognitive Testing  Alert? Yes  Normal Appearance? Yes  Oriented to person? Yes  Place? Yes   Time? Yes  Recall of three objects?  Yes  Can perform simple calculations? Yes  Displays appropriate judgment? Yes  Can read the correct time from a watch/clock? Yes  Medicare Attestation I have personally reviewed: The patient's medical and social history Their use of alcohol, tobacco or illicit drugs Their current medications and supplements The patient's functional ability including ADLs,fall risks, home safety risks, cognitive, and hearing and visual impairment Diet and physical activities Evidence for depression or mood disorders  The patient's weight, height, BMI, and visual acuity have been recorded in the chart.  I have made referrals, counseling, and provided education to the patient based on review of the above and I have provided the patient with a written personalized care plan for preventive services.  Over 40 minutes of exam, counseling, chart review was performed.  Christopher Aitken DAVID, MD   01/19/2015

## 2015-01-20 LAB — BASIC METABOLIC PANEL WITH GFR
BUN: 17 mg/dL (ref 6–23)
CO2: 23 mEq/L (ref 19–32)
Calcium: 8.8 mg/dL (ref 8.4–10.5)
Chloride: 104 mEq/L (ref 96–112)
Creat: 1.06 mg/dL (ref 0.50–1.35)
GFR, Est African American: 84 mL/min
GFR, Est Non African American: 73 mL/min
Glucose, Bld: 95 mg/dL (ref 70–99)
Potassium: 4.3 mEq/L (ref 3.5–5.3)
Sodium: 140 mEq/L (ref 135–145)

## 2015-01-20 LAB — LIPID PANEL
Cholesterol: 204 mg/dL — ABNORMAL HIGH (ref 0–200)
HDL: 49 mg/dL (ref >=40)
LDL Cholesterol: 107 mg/dL — ABNORMAL HIGH (ref 0–99)
Total CHOL/HDL Ratio: 4.2 Ratio
Triglycerides: 239 mg/dL — ABNORMAL HIGH (ref <150)
VLDL: 48 mg/dL — ABNORMAL HIGH (ref 0–40)

## 2015-01-20 LAB — INSULIN, RANDOM: Insulin: 7.4 u[IU]/mL (ref 2.0–19.6)

## 2015-01-20 LAB — HEPATIC FUNCTION PANEL
ALT: 14 U/L (ref 0–53)
AST: 24 U/L (ref 0–37)
Albumin: 3.9 g/dL (ref 3.5–5.2)
Alkaline Phosphatase: 73 U/L (ref 39–117)
Bilirubin, Direct: 0.1 mg/dL (ref 0.0–0.3)
Indirect Bilirubin: 0.2 mg/dL (ref 0.2–1.2)
Total Bilirubin: 0.3 mg/dL (ref 0.2–1.2)
Total Protein: 6.5 g/dL (ref 6.0–8.3)

## 2015-01-20 LAB — URINALYSIS, MICROSCOPIC ONLY
Bacteria, UA: NONE SEEN
Casts: NONE SEEN
Crystals: NONE SEEN
Squamous Epithelial / LPF: NONE SEEN

## 2015-01-20 LAB — HEMOGLOBIN A1C
Hgb A1c MFr Bld: 5.7 % — ABNORMAL HIGH (ref <5.7)
Mean Plasma Glucose: 117 mg/dL — ABNORMAL HIGH (ref <117)

## 2015-01-20 LAB — PSA: PSA: 0.39 ng/mL (ref <=4.00)

## 2015-01-20 LAB — MICROALBUMIN / CREATININE URINE RATIO
Creatinine, Urine: 73.9 mg/dL
Microalb Creat Ratio: 5.4 mg/g (ref 0.0–30.0)
Microalb, Ur: 0.4 mg/dL (ref <2.0)

## 2015-01-20 LAB — VITAMIN D 25 HYDROXY (VIT D DEFICIENCY, FRACTURES): Vit D, 25-Hydroxy: 23 ng/mL — ABNORMAL LOW (ref 30–100)

## 2015-01-20 LAB — MAGNESIUM: Magnesium: 2.1 mg/dL (ref 1.5–2.5)

## 2015-01-23 ENCOUNTER — Ambulatory Visit (HOSPITAL_COMMUNITY)
Admission: RE | Admit: 2015-01-23 | Discharge: 2015-01-23 | Disposition: A | Discharge status: Home / Self Care | Payer: Medicare Other | Source: Ambulatory Visit | Attending: Internal Medicine | Admitting: Internal Medicine

## 2015-01-23 DIAGNOSIS — I2510 Atherosclerotic heart disease of native coronary artery without angina pectoris: Secondary | ICD-10-CM | POA: Diagnosis not present

## 2015-01-23 DIAGNOSIS — C3490 Malignant neoplasm of unspecified part of unspecified bronchus or lung: Secondary | ICD-10-CM | POA: Diagnosis not present

## 2015-01-23 DIAGNOSIS — R0602 Shortness of breath: Secondary | ICD-10-CM | POA: Diagnosis not present

## 2015-01-23 DIAGNOSIS — J449 Chronic obstructive pulmonary disease, unspecified: Secondary | ICD-10-CM

## 2015-02-10 DIAGNOSIS — J449 Chronic obstructive pulmonary disease, unspecified: Secondary | ICD-10-CM | POA: Diagnosis not present

## 2015-02-25 ENCOUNTER — Other Ambulatory Visit: Payer: Self-pay | Admitting: Internal Medicine

## 2015-03-02 ENCOUNTER — Other Ambulatory Visit: Payer: Self-pay

## 2015-03-16 ENCOUNTER — Other Ambulatory Visit: Payer: Self-pay | Admitting: Internal Medicine

## 2015-04-13 ENCOUNTER — Other Ambulatory Visit: Payer: Self-pay | Admitting: Physician Assistant

## 2015-04-13 DIAGNOSIS — M8949 Other hypertrophic osteoarthropathy, multiple sites: Secondary | ICD-10-CM

## 2015-04-13 DIAGNOSIS — M150 Primary generalized (osteo)arthritis: Principal | ICD-10-CM

## 2015-04-13 DIAGNOSIS — M159 Polyosteoarthritis, unspecified: Secondary | ICD-10-CM

## 2015-04-13 NOTE — Telephone Encounter (Signed)
Called Rx into [facility]

## 2015-04-20 DIAGNOSIS — H524 Presbyopia: Secondary | ICD-10-CM | POA: Diagnosis not present

## 2015-04-20 DIAGNOSIS — H5203 Hypermetropia, bilateral: Secondary | ICD-10-CM | POA: Diagnosis not present

## 2015-04-20 DIAGNOSIS — H52203 Unspecified astigmatism, bilateral: Secondary | ICD-10-CM | POA: Diagnosis not present

## 2015-04-20 DIAGNOSIS — H2513 Age-related nuclear cataract, bilateral: Secondary | ICD-10-CM | POA: Diagnosis not present

## 2015-06-08 DIAGNOSIS — J449 Chronic obstructive pulmonary disease, unspecified: Secondary | ICD-10-CM | POA: Diagnosis not present

## 2015-06-22 ENCOUNTER — Other Ambulatory Visit: Payer: Self-pay | Admitting: Internal Medicine

## 2015-07-23 ENCOUNTER — Encounter: Payer: Self-pay | Admitting: Internal Medicine

## 2015-07-23 ENCOUNTER — Ambulatory Visit (INDEPENDENT_AMBULATORY_CARE_PROVIDER_SITE_OTHER): Payer: Medicare Other | Admitting: Internal Medicine

## 2015-07-23 VITALS — BP 116/74 | HR 68 | Temp 97.8°F | Resp 16 | Ht 71.5 in | Wt 211.0 lb

## 2015-07-23 DIAGNOSIS — I10 Essential (primary) hypertension: Secondary | ICD-10-CM

## 2015-07-23 DIAGNOSIS — R7303 Prediabetes: Secondary | ICD-10-CM | POA: Diagnosis not present

## 2015-07-23 DIAGNOSIS — E559 Vitamin D deficiency, unspecified: Secondary | ICD-10-CM

## 2015-07-23 DIAGNOSIS — Z79899 Other long term (current) drug therapy: Secondary | ICD-10-CM

## 2015-07-23 DIAGNOSIS — E782 Mixed hyperlipidemia: Secondary | ICD-10-CM

## 2015-07-23 DIAGNOSIS — I2510 Atherosclerotic heart disease of native coronary artery without angina pectoris: Secondary | ICD-10-CM | POA: Diagnosis not present

## 2015-07-23 DIAGNOSIS — R0989 Other specified symptoms and signs involving the circulatory and respiratory systems: Secondary | ICD-10-CM

## 2015-07-23 DIAGNOSIS — E039 Hypothyroidism, unspecified: Secondary | ICD-10-CM

## 2015-07-23 DIAGNOSIS — R7309 Other abnormal glucose: Secondary | ICD-10-CM | POA: Diagnosis not present

## 2015-07-23 LAB — CBC WITH DIFFERENTIAL/PLATELET
Basophils Absolute: 0.1 10*3/uL (ref 0.0–0.1)
Basophils Relative: 1 % (ref 0–1)
Eosinophils Absolute: 0.2 10*3/uL (ref 0.0–0.7)
Eosinophils Relative: 2 % (ref 0–5)
HCT: 42.5 % (ref 39.0–52.0)
Hemoglobin: 14.7 g/dL (ref 13.0–17.0)
Lymphocytes Relative: 32 % (ref 12–46)
Lymphs Abs: 2.5 10*3/uL (ref 0.7–4.0)
MCH: 32.5 pg (ref 26.0–34.0)
MCHC: 34.6 g/dL (ref 30.0–36.0)
MCV: 94 fL (ref 78.0–100.0)
MPV: 9.3 fL (ref 8.6–12.4)
Monocytes Absolute: 0.9 10*3/uL (ref 0.1–1.0)
Monocytes Relative: 12 % (ref 3–12)
Neutro Abs: 4.1 10*3/uL (ref 1.7–7.7)
Neutrophils Relative %: 53 % (ref 43–77)
Platelets: 238 10*3/uL (ref 150–400)
RBC: 4.52 MIL/uL (ref 4.22–5.81)
RDW: 13.5 % (ref 11.5–15.5)
WBC: 7.8 10*3/uL (ref 4.0–10.5)

## 2015-07-23 MED ORDER — CYCLOBENZAPRINE HCL 10 MG PO TABS: 10.0000 mg | ORAL_TABLET | Freq: Three times a day (TID) | ORAL | Status: AC | PRN

## 2015-07-23 NOTE — Progress Notes (Signed)
Patient ID: Christopher Peck, male   DOB: February 05, 1946, 69 y.o.   MRN: [id number]  Assessment and Plan:  Hypertension:  -Continue medication,  -monitor blood pressure at home.  -Continue DASH diet.   -Reminder to go to the ER if any CP, SOB, nausea, dizziness, severe HA, changes vision/speech, left arm numbness and tingling, and jaw pain.  Cholesterol: -Continue diet and exercise.  -Check cholesterol.   Pre-diabetes: -Continue diet and exercise.  -Check A1C  Vitamin D Def: -check level -continue medications.   Pulmonary Fibrosis -albuterol -breo sample given  Continue diet and meds as discussed. Further disposition pending results of labs.  HPI 69 y.o. male  presents for 3 month follow up with hypertension, hyperlipidemia, prediabetes and vitamin D.   His blood pressure has been controlled at home, today their BP is BP: 116/74 mmHg.   He does workout. He denies chest pain, shortness of breath, dizziness.  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     He is not on cholesterol medication and denies myalgias. His cholesterol is at goal. The cholesterol last visit was:   Lab Results  Component Value Date   CHOL 204* 01/19/2015   HDL 49 01/19/2015   LDLCALC 107* 01/19/2015   TRIG 239* 01/19/2015   CHOLHDL 4.2 01/19/2015     He has been working on diet and exercise for prediabetes, and denies foot ulcerations, hyperglycemia, hypoglycemia , increased appetite, nausea, paresthesia of the feet, polydipsia, polyuria, visual disturbances, vomiting and weight loss. Last A1C in the office was:  Lab Results  Component Value Date   HGBA1C 5.7* 01/19/2015    Patient is on Vitamin D supplement.  Lab Results  Component Value Date   VD25OH 33* 01/19/2015      He reports that he has been having scans done by oncology for worsening pulmonary fibrosis. He has not seen Pulmonology.  He reports that he has  continued to do activity despite chest tightness and shortness of breath.   Current Medications:  Current Outpatient Prescriptions on File Prior to Visit  Medication Sig Dispense Refill  . cholecalciferol (VITAMIN D) 1000 UNITS tablet Take 1,000 Units by mouth daily.    . citalopram (CELEXA) 20 MG tablet TAKE 1 TABLET BY MOUTH ONCE DAILY FOR MOOD 90 tablet 3  . finasteride (PROSCAR) 5 MG tablet TAKE 1 TABLET BY MOUTH ONCE DAILY 90 tablet 1  . levothyroxine (SYNTHROID, LEVOTHROID) 125 MCG tablet Take 1 tablet (125 mcg total) by mouth daily. 90 tablet 3  . Magnesium 500 MG TABS Take by mouth. daily    . PROAIR HFA 108 (90 BASE) MCG/ACT inhaler INHALE 2 PUFFS BY MOUTH EVERY 4 HOURS AS NEEDED 8.5 Inhaler 5  . tiZANidine (ZANAFLEX) 4 MG tablet TAKE 1 TABLET BY MOUTH 3 TIMES A DAY 90 tablet 1  . traMADol (ULTRAM) 50 MG tablet Tqake 1 tablet 3 x day only if needed for pain 90 tablet 5   No current facility-administered medications on file prior to visit.    Medical History:  Past Medical History  Diagnosis Date  . Cancer ([place])   . Hypertension   . Hypothyroid   . COPD (chronic obstructive pulmonary disease) ([place])   . Emphysema of lung ([place])   . Hyperlipidemia   . Other abnormal glucose     Allergies:  Allergies  Allergen Reactions  . Spiriva [Tiotropium Bromide Monohydrate]      Review of Systems:  Review of Systems  Constitutional: Negative for fever, chills and malaise/fatigue.  HENT: Negative for congestion, ear pain and sore throat.   Eyes: Negative.   Respiratory: Positive for cough, shortness of breath and wheezing.   Cardiovascular: Positive for chest pain. Negative for palpitations and leg swelling.  Gastrointestinal: Negative for heartburn, diarrhea, constipation, blood in stool and melena.  Genitourinary: Negative.   Skin: Negative.   Neurological: Negative for dizziness, sensory change, loss of consciousness and headaches.    Family history- Review and  unchanged  Social history- Review and unchanged  Physical Exam: BP 116/74 mmHg  Pulse 68  Temp(Src) 97.8 F (36.6 C) (Temporal)  Resp 16  Ht 5' 11.5" (1.816 m)  Wt 211 lb (95.709 kg)  BMI 29.02 kg/m2  SpO2 96% Wt Readings from Last 3 Encounters:  07/23/15 211 lb (95.709 kg)  01/19/15 210 lb (95.255 kg)  07/21/14 209 lb (94.802 kg)    General Appearance: Well nourished well developed, in no apparent distress. Eyes: PERRLA, EOMs, conjunctiva no swelling or erythema ENT/Mouth: Ear canals normal without obstruction, swelling, erythma, discharge.  TMs normal bilaterally.  Oropharynx moist, clear, without exudate, or postoropharyngeal swelling. Neck: Supple, thyroid normal,no cervical adenopathy  Respiratory: Respiratory effort normal, Breath sounds clear A&P without rhonchi, wheeze, or rale.  No retractions, no accessory usage. Cardio: RRR with no MRGs. Brisk peripheral pulses without edema.  Abdomen: Soft, + BS,  Non tender, no guarding, rebound, hernias, masses. Musculoskeletal: Full ROM, 5/5 strength, Normal gait Skin: Warm, dry without rashes, lesions, ecchymosis.  Neuro: Awake and oriented X 3, Cranial nerves intact. Normal muscle tone, no cerebellar symptoms. Psych: Normal affect, Insight and Judgment appropriate.    Starlyn Skeans, PA-C 2:43 PM [facility]

## 2015-07-24 LAB — HEPATIC FUNCTION PANEL
ALT: 15 U/L (ref 9–46)
AST: 26 U/L (ref 10–35)
Albumin: 4.2 g/dL (ref 3.6–5.1)
Alkaline Phosphatase: 65 U/L (ref 40–115)
Bilirubin, Direct: 0.1 mg/dL (ref <=0.2)
Indirect Bilirubin: 0.3 mg/dL (ref 0.2–1.2)
Total Bilirubin: 0.4 mg/dL (ref 0.2–1.2)
Total Protein: 6.6 g/dL (ref 6.1–8.1)

## 2015-07-24 LAB — BASIC METABOLIC PANEL WITH GFR
BUN: 15 mg/dL (ref 7–25)
CO2: 29 mmol/L (ref 20–31)
Calcium: 9.2 mg/dL (ref 8.6–10.3)
Chloride: 102 mmol/L (ref 98–110)
Creat: 0.99 mg/dL (ref 0.70–1.25)
GFR, Est African American: >89 mL/min (ref >=60)
GFR, Est Non African American: 78 mL/min (ref >=60)
Glucose, Bld: 79 mg/dL (ref 65–99)
Potassium: 3.9 mmol/L (ref 3.5–5.3)
Sodium: 139 mmol/L (ref 135–146)

## 2015-07-24 LAB — TSH: TSH: 4.436 u[IU]/mL (ref 0.350–4.500)

## 2015-08-03 ENCOUNTER — Encounter: Payer: Self-pay | Admitting: Internal Medicine

## 2015-08-03 ENCOUNTER — Other Ambulatory Visit: Payer: Self-pay | Admitting: Internal Medicine

## 2015-08-03 MED ORDER — FLUTICASONE FUROATE-VILANTEROL 100-25 MCG/INH IN AEPB: 1.0000 | INHALATION_SPRAY | Freq: Every day | RESPIRATORY_TRACT | Status: DC

## 2015-09-18 ENCOUNTER — Other Ambulatory Visit: Payer: Self-pay | Admitting: Internal Medicine

## 2015-10-12 ENCOUNTER — Other Ambulatory Visit: Payer: Self-pay | Admitting: Internal Medicine

## 2015-10-12 NOTE — Telephone Encounter (Signed)
[facility] was [facility]. 

## 2015-10-25 ENCOUNTER — Other Ambulatory Visit: Payer: Self-pay | Admitting: Internal Medicine

## 2015-10-26 ENCOUNTER — Ambulatory Visit (INDEPENDENT_AMBULATORY_CARE_PROVIDER_SITE_OTHER): Payer: Medicare Other | Admitting: Internal Medicine

## 2015-10-26 VITALS — BP 136/80 | HR 60 | Temp 98.0°F | Resp 16 | Ht 71.5 in | Wt 208.0 lb

## 2015-10-26 DIAGNOSIS — Z0001 Encounter for general adult medical examination with abnormal findings: Secondary | ICD-10-CM

## 2015-10-26 DIAGNOSIS — I10 Essential (primary) hypertension: Secondary | ICD-10-CM | POA: Diagnosis not present

## 2015-10-26 DIAGNOSIS — J701 Chronic and other pulmonary manifestations due to radiation: Secondary | ICD-10-CM | POA: Diagnosis not present

## 2015-10-26 DIAGNOSIS — R0989 Other specified symptoms and signs involving the circulatory and respiratory systems: Secondary | ICD-10-CM

## 2015-10-26 DIAGNOSIS — Z79899 Other long term (current) drug therapy: Secondary | ICD-10-CM

## 2015-10-26 DIAGNOSIS — E039 Hypothyroidism, unspecified: Secondary | ICD-10-CM | POA: Diagnosis not present

## 2015-10-26 DIAGNOSIS — Z9889 Other specified postprocedural states: Secondary | ICD-10-CM

## 2015-10-26 DIAGNOSIS — E559 Vitamin D deficiency, unspecified: Secondary | ICD-10-CM

## 2015-10-26 DIAGNOSIS — R6889 Other general symptoms and signs: Secondary | ICD-10-CM

## 2015-10-26 DIAGNOSIS — J449 Chronic obstructive pulmonary disease, unspecified: Secondary | ICD-10-CM | POA: Diagnosis not present

## 2015-10-26 DIAGNOSIS — I2510 Atherosclerotic heart disease of native coronary artery without angina pectoris: Secondary | ICD-10-CM

## 2015-10-26 DIAGNOSIS — Z8719 Personal history of other diseases of the digestive system: Secondary | ICD-10-CM

## 2015-10-26 DIAGNOSIS — E782 Mixed hyperlipidemia: Secondary | ICD-10-CM

## 2015-10-26 DIAGNOSIS — R7309 Other abnormal glucose: Secondary | ICD-10-CM | POA: Diagnosis not present

## 2015-10-26 DIAGNOSIS — C3490 Malignant neoplasm of unspecified part of unspecified bronchus or lung: Secondary | ICD-10-CM

## 2015-10-26 DIAGNOSIS — R7303 Prediabetes: Secondary | ICD-10-CM | POA: Diagnosis not present

## 2015-10-26 DIAGNOSIS — Z0000 Encounter for general adult medical examination without abnormal findings: Secondary | ICD-10-CM

## 2015-10-26 DIAGNOSIS — N320 Bladder-neck obstruction: Secondary | ICD-10-CM

## 2015-10-26 LAB — BASIC METABOLIC PANEL WITH GFR
BUN: 17 mg/dL (ref 7–25)
CO2: 26 mmol/L (ref 20–31)
Calcium: 8.9 mg/dL (ref 8.6–10.3)
Chloride: 103 mmol/L (ref 98–110)
Creat: 1.12 mg/dL (ref 0.70–1.25)
GFR, Est African American: 78 mL/min (ref >=60)
GFR, Est Non African American: 68 mL/min (ref >=60)
Glucose, Bld: 85 mg/dL (ref 65–99)
Potassium: 4.8 mmol/L (ref 3.5–5.3)
Sodium: 139 mmol/L (ref 135–146)

## 2015-10-26 LAB — CBC WITH DIFFERENTIAL/PLATELET
Basophils Absolute: 0.1 10*3/uL (ref 0.0–0.1)
Basophils Relative: 1 % (ref 0–1)
Eosinophils Absolute: 0.1 10*3/uL (ref 0.0–0.7)
Eosinophils Relative: 2 % (ref 0–5)
HCT: 44 % (ref 39.0–52.0)
Hemoglobin: 14.9 g/dL (ref 13.0–17.0)
Lymphocytes Relative: 27 % (ref 12–46)
Lymphs Abs: 1.9 10*3/uL (ref 0.7–4.0)
MCH: 32.9 pg (ref 26.0–34.0)
MCHC: 33.9 g/dL (ref 30.0–36.0)
MCV: 97.1 fL (ref 78.0–100.0)
MPV: 9 fL (ref 8.6–12.4)
Monocytes Absolute: 1 10*3/uL (ref 0.1–1.0)
Monocytes Relative: 14 % — ABNORMAL HIGH (ref 3–12)
Neutro Abs: 4 10*3/uL (ref 1.7–7.7)
Neutrophils Relative %: 56 % (ref 43–77)
Platelets: 243 10*3/uL (ref 150–400)
RBC: 4.53 MIL/uL (ref 4.22–5.81)
RDW: 13.6 % (ref 11.5–15.5)
WBC: 7.2 10*3/uL (ref 4.0–10.5)

## 2015-10-26 LAB — HEPATIC FUNCTION PANEL
ALT: 15 U/L (ref 9–46)
AST: 22 U/L (ref 10–35)
Albumin: 3.8 g/dL (ref 3.6–5.1)
Alkaline Phosphatase: 62 U/L (ref 40–115)
Bilirubin, Direct: 0.1 mg/dL (ref <=0.2)
Indirect Bilirubin: 0.3 mg/dL (ref 0.2–1.2)
Total Bilirubin: 0.4 mg/dL (ref 0.2–1.2)
Total Protein: 6.5 g/dL (ref 6.1–8.1)

## 2015-10-26 LAB — HEMOGLOBIN A1C
Hgb A1c MFr Bld: 5.6 % (ref <5.7)
Mean Plasma Glucose: 114 mg/dL (ref <117)

## 2015-10-26 LAB — LIPID PANEL
Cholesterol: 204 mg/dL — ABNORMAL HIGH (ref 125–200)
HDL: 55 mg/dL (ref >=40)
LDL Cholesterol: 124 mg/dL (ref <130)
Total CHOL/HDL Ratio: 3.7 Ratio (ref <=5.0)
Triglycerides: 127 mg/dL (ref <150)
VLDL: 25 mg/dL (ref <30)

## 2015-10-26 LAB — TSH: TSH: 3.28 mIU/L (ref 0.40–4.50)

## 2015-10-26 MED ORDER — FLUTICASONE FUROATE-VILANTEROL 100-25 MCG/INH IN AEPB: INHALATION_SPRAY | RESPIRATORY_TRACT | Status: DC

## 2015-10-26 NOTE — Progress Notes (Signed)
Patient ID: Christopher Peck, male   DOB: [birth year], 70 y.o.   MRN: [id number]  MEDICARE ANNUAL WELLNESS VISIT AND FOLLOW UP Assessment:    1. Labile hypertension -well controlled -cont meds -DASH diet -monitor at home -encouraged exercise  2. Hypothyroidism, unspecified hypothyroidism type -TSH -cont levothyroxine  3. Prediabetes -diet and exercise  4. Hyperlipidemia, diet controlled -cont diet and exercise  5. Medication management -CBC -BMET -HFP  6. Atherosclerosis of native coronary artery of native heart without angina pectoris -diet and exercise -cholesterol management  7. Radiation fibrosis of lung (HCC) -cont tramadol prn -cont flexeril prn  8. Malignant neoplasm of lung, unspecified laterality, unspecified part of lung ([place]) -currently in remission  9. COPD -breo -albuterol prn  10. BPH/Prostatism -cont finasteride  11. Vitamin D deficiency -cont supplement  12. History of bilateral inguinal hernia repair -resolvedd     Over 30 minutes of exam, counseling, chart review, and critical decision making was performed  Plan:   During the course of the visit the patient was educated and counseled about appropriate screening and preventive services including:    Pneumococcal vaccine   Influenza vaccine  Prevnar 13  Td vaccine  Screening electrocardiogram  Colorectal cancer screening  Diabetes screening  Glaucoma screening  Nutrition counseling   Conditions/risks identified: BMI: Discussed weight loss, diet, and increase physical activity.  Increase physical activity: AHA recommends 150 minutes of physical activity a week.  Medications reviewed Diabetes is at goal, ACE/ARB therapy: Yes. Urinary Incontinence is an issue: discussed non pharmacology and pharmacology options.  Fall risk: low- discussed PT, home fall assessment, medications.    Subjective:  Christopher Peck is a 70 y.o. male who presents for Medicare Annual Wellness  Visit and 3 month follow up for HTN, hyperlipidemia, prediabetes, and vitamin D Def.  Date of last medicare wellness visit was is unknown.  His blood pressure has been controlled at home, today their BP is BP: 136/80 mmHg He does not workout. He denies chest pain, shortness of breath, dizziness.  He is not on cholesterol medication and denies myalgias. His cholesterol is at goal. The cholesterol last visit was:   Lab Results  Component Value Date   CHOL 204* 01/19/2015   HDL 49 01/19/2015   LDLCALC 107* 01/19/2015   TRIG 239* 01/19/2015   CHOLHDL 4.2 01/19/2015   He has been working on diet and exercise for prediabetes, and denies foot ulcerations, hyperglycemia, hypoglycemia , increased appetite, nausea, paresthesia of the feet, polydipsia, polyuria, visual disturbances, vomiting and weight loss. Last A1C in the office was:  Lab Results  Component Value Date   HGBA1C 5.7* 01/19/2015   Last GFR NonAA   Lab Results  Component Value Date   [facility] 78 07/23/2015   AA  Lab Results  Component Value Date   GFRAA >89 07/23/2015   Patient is on Vitamin D supplement.   Lab Results  Component Value Date   VD25OH 23* 01/19/2015       Medication Review: Current Outpatient Prescriptions on File Prior to Visit  Medication Sig Dispense Refill  . BREO ELLIPTA 100-25 MCG/INH AEPB INHALE 1 PUFF INTO THE LUNGS DAILY. 60 each 5  . cholecalciferol (VITAMIN D) 1000 UNITS tablet Take 1,000 Units by mouth daily.    . citalopram (CELEXA) 20 MG tablet TAKE 1 TABLET BY MOUTH ONCE DAILY FOR MOOD 90 tablet 3  . cyclobenzaprine (FLEXERIL) 10 MG tablet Take 1 tablet (10 mg total) by mouth every 8 (eight) hours as  needed for muscle spasms. 90 tablet 1  . finasteride (PROSCAR) 5 MG tablet TAKE 1 TABLET BY MOUTH ONCE DAILY 90 tablet 1  . levothyroxine (SYNTHROID, LEVOTHROID) 125 MCG tablet Take 1 tablet (125 mcg total) by mouth daily. 90 tablet 3  . Magnesium 500 MG TABS Take by mouth. daily    . PROAIR  HFA 108 (90 BASE) MCG/ACT inhaler INHALE 2 PUFFS BY MOUTH EVERY 4 HOURS AS NEEDED 8.5 Inhaler 5  . traMADol (ULTRAM) 50 MG tablet TAKE 1 TABLET BY MOUTH 3 TIMES A DAY AS NEEDED 90 tablet 0   No current facility-administered medications on file prior to visit.    Current Problems (verified) Patient Active Problem List   Diagnosis Date Noted  . Labile hypertension 01/15/2014  . Vitamin D deficiency 01/15/2014  . Prediabetes 01/15/2014  . Small Cell Lung Cancer (2006) 01/15/2014  . Hyperlipidemia, diet controlled 01/15/2014  . Medication management 01/15/2014  . Hypothyroidism 01/15/2014  . COPD 01/15/2014  . BPH/Prostatism 01/15/2014  . Radiation fibrosis of lung ([place]) 07/12/2013  . CORONARY ATHEROSCLEROSIS NATIVE CORONARY ARTERY 05/11/2009  . INGUINAL HERNIORRHAPHIES, BILATERAL, HX OF 05/11/2009    Screening Tests Immunization History  Administered Date(s) Administered  . Influenza, High Dose Seasonal PF 07/21/2014  . Influenza-Unspecified 06/24/2015  . Pneumococcal Conjugate-13 06/24/2015  . Pneumococcal Polysaccharide-23 01/03/2012  . Td 01/03/2012    Preventative care: Last colonoscopy: refused, and refused cologuard as well  Prior vaccinations: TD or Tdap: 2013  Influenza: 2016  Pneumococcal: 2013 Prevnar13: 2013 Shingles/Zostavax: Declined  Names of Other Physician/Practitioners you currently use: 1. [facility] here for primary care 2. Dr. Gershon Crane, eye doctor, last visit 2017 3. Does not see a dentist   Patient Care Team: Unk Pinto, MD as PCP - General (Internal Medicine) Curt Bears, MD as Consulting Physician (Oncology) Rutherford Guys, MD as Consulting Physician (Ophthalmology)  No past surgical history on file. Family History  Problem Relation Age of Onset  . COPD Father   . Kidney failure Father    Social History  Substance Use Topics  . Smoking status: Former Smoker    Quit date: 09/05/2004  .  Smokeless tobacco: Not on file  . Alcohol Use: 1.5 oz/week    3 drink(s) per week    MEDICARE WELLNESS OBJECTIVES: Tobacco use: He does smoke.  Patient is a former smoker. If yes, counseling given Alcohol Current alcohol use: social drinker Osteoporosis: hypogonadism, History of fracture in the past year: no Fall risk: Low Risk Hearing: normal Visual acuity: normal,  does perform annual eye exam Diet: well balanced Physical activity: Current Exercise Habits:: Exercise is limited by, Limited by:: cardiac condition(s);respiratory conditions(s) Cardiac risk factors: Cardiac Risk Factors include: advanced age (>72mn, >>41women);diabetes mellitus;dyslipidemia;hypertension;male gender;smoking/ tobacco exposure;sedentary lifestyle Depression/mood screen:   [facility] 10/26/2015  Decreased Interest 0  Down, Depressed, Hopeless 0  PHQ - 2 Score 0    ADLs:  In your present state of health, do you have any difficulty performing the following activities: 10/26/2015 01/19/2015  Hearing? N N  Vision? N N  Difficulty concentrating or making decisions? N N  Walking or climbing stairs? N N  Dressing or bathing? N N  Doing errands, shopping? N N  Preparing Food and eating ? N -  Using the Toilet? N -  In the past six months, have you accidently leaked urine? N -  Do you have problems with loss of bowel control? N -  Managing your  Medications? N -  Managing your Finances? N -  Housekeeping or managing your Housekeeping? N -     Cognitive Testing  Alert? Yes  Normal Appearance?Yes  Oriented to person? Yes  Place? Yes   Time? Yes  Recall of three objects?  Yes  Can perform simple calculations? Yes  Displays appropriate judgment?Yes  Can read the correct time from a watch face?Yes  EOL planning: Does patient have an advance directive?: Yes Type of Advance Directive: Harris will Does patient want to make changes to advanced directive?: No -  Patient declined   Objective:   Today's Vitals   10/26/15 1031  BP: 136/80  Pulse: 60  Temp: 98 F (36.7 C)  TempSrc: Temporal  Resp: 16  Height: 5' 11.5" (1.816 m)  Weight: 208 lb (94.348 kg)  SpO2: 95%   Body mass index is 28.61 kg/(m^2).  General appearance: alert, no distress, WD/WN, male HEENT: normocephalic, sclerae anicteric, TMs pearly, nares patent, no discharge or erythema, pharynx normal Oral cavity: MMM, no lesions Neck: supple, no lymphadenopathy, no thyromegaly, no masses Heart: RRR, normal S1, S2, no murmurs Lungs: CTA bilaterally, no wheezes, rhonchi, or rales Abdomen: +bs, soft, non tender, non distended, no masses, no hepatomegaly, no splenomegaly Musculoskeletal: nontender, no swelling, no obvious deformity Extremities: no edema, no cyanosis, no clubbing Pulses: 2+ symmetric, upper and lower extremities, normal cap refill Neurological: alert, oriented x 3, CN2-12 intact, strength normal upper extremities and lower extremities, sensation normal throughout, DTRs 2+ throughout, no cerebellar signs, gait normal Psychiatric: normal affect, behavior normal, pleasant   Medicare Attestation I have personally reviewed: The patient's medical and social history Their use of alcohol, tobacco or illicit drugs Their current medications and supplements The patient's functional ability including ADLs,fall risks, home safety risks, cognitive, and hearing and visual impairment Diet and physical activities Evidence for depression or mood disorders  The patient's weight, height, BMI, and visual acuity have been recorded in the chart.  I have made referrals, counseling, and provided education to the patient based on review of the above and I have provided the patient with a written personalized care plan for preventive services.     Starlyn Skeans, PA-C   10/26/2015

## 2015-11-30 ENCOUNTER — Other Ambulatory Visit: Payer: Self-pay | Admitting: Internal Medicine

## 2015-11-30 DIAGNOSIS — G894 Chronic pain syndrome: Secondary | ICD-10-CM

## 2015-11-30 NOTE — Telephone Encounter (Signed)
RX CALLED INTO [facility]. 

## 2016-01-08 ENCOUNTER — Other Ambulatory Visit: Payer: Self-pay | Admitting: Internal Medicine

## 2016-01-11 ENCOUNTER — Other Ambulatory Visit: Payer: Self-pay | Admitting: Internal Medicine

## 2016-02-08 ENCOUNTER — Ambulatory Visit (INDEPENDENT_AMBULATORY_CARE_PROVIDER_SITE_OTHER): Payer: Medicare Other | Admitting: Internal Medicine

## 2016-02-08 ENCOUNTER — Encounter: Payer: Self-pay | Admitting: Internal Medicine

## 2016-02-08 VITALS — BP 136/84 | HR 56 | Temp 97.6°F | Resp 16 | Ht 71.75 in | Wt 212.0 lb

## 2016-02-08 DIAGNOSIS — I10 Essential (primary) hypertension: Secondary | ICD-10-CM

## 2016-02-08 DIAGNOSIS — E782 Mixed hyperlipidemia: Secondary | ICD-10-CM

## 2016-02-08 DIAGNOSIS — R7303 Prediabetes: Secondary | ICD-10-CM

## 2016-02-08 DIAGNOSIS — E039 Hypothyroidism, unspecified: Secondary | ICD-10-CM

## 2016-02-08 DIAGNOSIS — E559 Vitamin D deficiency, unspecified: Secondary | ICD-10-CM

## 2016-02-08 DIAGNOSIS — Z136 Encounter for screening for cardiovascular disorders: Secondary | ICD-10-CM | POA: Diagnosis not present

## 2016-02-08 DIAGNOSIS — J4489 Other specified chronic obstructive pulmonary disease: Secondary | ICD-10-CM

## 2016-02-08 DIAGNOSIS — Z79899 Other long term (current) drug therapy: Secondary | ICD-10-CM

## 2016-02-08 DIAGNOSIS — Z1212 Encounter for screening for malignant neoplasm of rectum: Secondary | ICD-10-CM

## 2016-02-08 DIAGNOSIS — J449 Chronic obstructive pulmonary disease, unspecified: Secondary | ICD-10-CM

## 2016-02-08 DIAGNOSIS — R7309 Other abnormal glucose: Secondary | ICD-10-CM | POA: Diagnosis not present

## 2016-02-08 DIAGNOSIS — Z85118 Personal history of other malignant neoplasm of bronchus and lung: Secondary | ICD-10-CM

## 2016-02-08 DIAGNOSIS — N320 Bladder-neck obstruction: Secondary | ICD-10-CM

## 2016-02-08 DIAGNOSIS — I2510 Atherosclerotic heart disease of native coronary artery without angina pectoris: Secondary | ICD-10-CM

## 2016-02-08 DIAGNOSIS — Z0001 Encounter for general adult medical examination with abnormal findings: Secondary | ICD-10-CM

## 2016-02-08 DIAGNOSIS — Z0000 Encounter for general adult medical examination without abnormal findings: Secondary | ICD-10-CM

## 2016-02-08 DIAGNOSIS — R0989 Other specified symptoms and signs involving the circulatory and respiratory systems: Secondary | ICD-10-CM

## 2016-02-08 DIAGNOSIS — J8410 Pulmonary fibrosis, unspecified: Secondary | ICD-10-CM

## 2016-02-08 LAB — CBC WITH DIFFERENTIAL/PLATELET
Basophils Absolute: 77 cells/uL (ref 0–200)
Basophils Relative: 1 %
Eosinophils Absolute: 231 cells/uL (ref 15–500)
Eosinophils Relative: 3 %
HCT: 42.3 % (ref 38.5–50.0)
Hemoglobin: 14.2 g/dL (ref 13.2–17.1)
Lymphocytes Relative: 28 %
Lymphs Abs: 2156 cells/uL (ref 850–3900)
MCH: 32.1 pg (ref 27.0–33.0)
MCHC: 33.6 g/dL (ref 32.0–36.0)
MCV: 95.7 fL (ref 80.0–100.0)
MPV: 9.1 fL (ref 7.5–12.5)
Monocytes Absolute: 770 cells/uL (ref 200–950)
Monocytes Relative: 10 %
Neutro Abs: 4466 cells/uL (ref 1500–7800)
Neutrophils Relative %: 58 %
Platelets: 224 10*3/uL (ref 140–400)
RBC: 4.42 MIL/uL (ref 4.20–5.80)
RDW: 13.5 % (ref 11.0–15.0)
WBC: 7.7 10*3/uL (ref 3.8–10.8)

## 2016-02-08 LAB — HEMOGLOBIN A1C
Hgb A1c MFr Bld: 5.6 % (ref <5.7)
Mean Plasma Glucose: 114 mg/dL

## 2016-02-08 LAB — HEPATIC FUNCTION PANEL
ALT: 12 U/L (ref 9–46)
AST: 23 U/L (ref 10–35)
Albumin: 3.9 g/dL (ref 3.6–5.1)
Alkaline Phosphatase: 63 U/L (ref 40–115)
Bilirubin, Direct: 0.1 mg/dL (ref <=0.2)
Indirect Bilirubin: 0.2 mg/dL (ref 0.2–1.2)
Total Bilirubin: 0.3 mg/dL (ref 0.2–1.2)
Total Protein: 6.4 g/dL (ref 6.1–8.1)

## 2016-02-08 LAB — LIPID PANEL
Cholesterol: 204 mg/dL — ABNORMAL HIGH (ref 125–200)
HDL: 54 mg/dL (ref >=40)
LDL Cholesterol: 118 mg/dL (ref <130)
Total CHOL/HDL Ratio: 3.8 Ratio (ref <=5.0)
Triglycerides: 161 mg/dL — ABNORMAL HIGH (ref <150)
VLDL: 32 mg/dL — ABNORMAL HIGH (ref <30)

## 2016-02-08 LAB — BASIC METABOLIC PANEL WITH GFR
BUN: 16 mg/dL (ref 7–25)
CO2: 26 mmol/L (ref 20–31)
Calcium: 8.6 mg/dL (ref 8.6–10.3)
Chloride: 103 mmol/L (ref 98–110)
Creat: 1.15 mg/dL (ref 0.70–1.25)
GFR, Est African American: 76 mL/min (ref >=60)
GFR, Est Non African American: 65 mL/min (ref >=60)
Glucose, Bld: 96 mg/dL (ref 65–99)
Potassium: 3.9 mmol/L (ref 3.5–5.3)
Sodium: 139 mmol/L (ref 135–146)

## 2016-02-08 LAB — MAGNESIUM: Magnesium: 2 mg/dL (ref 1.5–2.5)

## 2016-02-08 NOTE — Patient Instructions (Signed)

## 2016-02-08 NOTE — Progress Notes (Signed)
Patient ID: Christopher Peck, male   DOB: 1946-08-15, 70 y.o.   MRN: [id number]  [facility]   Unk Pinto, M.D.    Uvaldo Bristle. Silverio Lay, P.A.-C      Starlyn Skeans, P.A.-C   [facility]                [address] [place], [place] [address] Telephone [phone] Telefax [phone] _________________________________  Annual  Screening/Preventative Visit And Comprehensive Evaluation & Examination      This very nice 70 y.o. MWM presents for a Wellness/Preventative Visit & comprehensive evaluation and management of multiple medical co-morbidities.  Patient has been followed for HTN, Prediabetes, Hyperlipidemia, COPD, Hypothyroidism  and Vitamin D Deficiency. Patient also has hx/o Small Cell Lung  Ca in 2008 and underwent curative chemoradiation with unfortunate complication of pulmonary fibrosis.       Patient has hx/o labile HTN predates circa 2012 and is followed expectantly. Patient's BP has been controlled and today's BP: 136/84 mmHg. Patient denies any cardiac symptoms as chest pain, palpitations, shortness of breath, dizziness or ankle swelling.     Patient's hyperlipidemia is not controlled with diet and he is reticent to taking chol meds. Patient denies myalgias or other medication SE's. Last lipids were  Cholesterol 204*; HDL 55; elevated LDL 124; Triglycerides 127 on 10/26/2015.       Patient is monitored expectantly for prediabetes and patient denies reactive hypoglycemic symptoms, visual blurring, diabetic polys or paresthesias. Last A1c was 5.6% on 10/26/2015.      Patient has been on thyroid replacement rince2013. Finally, patient has history of Vitamin D Deficiency of "61" in 2008 and last vitamin D was still very low at "17" in May 2016.  Medication Sig  . VITAMIN D 1000 UNITS Take 1,000 Units by mouth daily.  . citalopram  20 MG  TAKE 1 TABLET BY MOUTH ONCE DAILY FOR MOOD  . Finasteride 5 MG   TAKE 1 TABLET BY MOUTH ONCE DAILY  . BREO ELLIPTA 100-25 INHALE 1 PUFF INTO THE LUNGS DAILY.  Marland Kitchen Levothyroxine 125 MCG Take 1 tablet (125 mcg total) by mouth daily.  . Magnesium 500 MG  Take by mouth. daily  . PROAIR HFA  INHALE 2 PUFFS BY MOUTH EVERY 4 HOURS AS NEEDED  . traMADol  50 MG  TAKE 1 TABLET BY MOUTH 3 TIMES A DAY AS NEEDED FOR PAIN   Allergies  Allergen Reactions  . Spiriva [Tiotropium Bromide Monohydrate]    Past Medical History  Diagnosis Date  . Cancer ([place])   . Hypertension   . Hypothyroid   . COPD (chronic obstructive pulmonary disease) ([place])   . Emphysema of lung ([place])   . Hyperlipidemia   . Other abnormal glucose    Health Maintenance  Topic Date Due  . COLONOSCOPY  09/05/2016 (Originally 07/06/1998)  . ZOSTAVAX  09/05/2016 (Originally 07/06/2008)  . Hepatitis C Screening  09/05/2016 (Originally Oct 24, 1945)  . INFLUENZA VACCINE  04/05/2016  . PNA vac Low Risk Adult (2 of 2 - PPSV23) 01/02/2017  . TETANUS/TDAP  01/02/2022   Immunization History  Administered Date(s) Administered  . Influenza, High Dose Seasonal PF 07/21/2014  . Influenza-Unspecified 06/24/2015  . Pneumococcal Conjugate-13 06/24/2015  . Pneumococcal Polysaccharide-23 01/03/2012  . Td 01/03/2012   No past surgical history on file.  Family History  Problem Relation Age of Onset  .  COPD Father   . Kidney failure Father     Social History   Social History  . Marital Status: Married    Spouse Name: N/A  . Number of Children: N/A  . Years of Education: N/A   Occupational History  . Retired disabled Runner, broadcasting/film/video since 2006.    Social History Main Topics  . Smoking status: Former Smoker    Quit date: 09/05/2004  . Smokeless tobacco: Not on file  . Alcohol Use: 1.5 oz/week    3 drink(s) per week  . Drug Use: Not on file  . Sexual Activity: Not on file    ROS Constitutional: Denies fever, chills, weight loss/gain, headaches, insomnia,  night sweats or change in appetite. Does  c/o fatigue. Eyes: Denies redness, blurred vision, diplopia, discharge, itchy or watery eyes.  ENT: Denies discharge, congestion, post nasal drip, epistaxis, sore throat, earache, hearing loss, dental pain, Tinnitus, Vertigo, Sinus pain or snoring.  Cardio: Denies chest pain, palpitations, irregular heartbeat, syncope, dyspnea, diaphoresis, orthopnea, PND, claudication or edema Respiratory: denies cough, dyspnea, DOE, pleurisy, hoarseness, laryngitis or wheezing.  Gastrointestinal: Denies dysphagia, heartburn, reflux, water brash, pain, cramps, nausea, vomiting, bloating, diarrhea, constipation, hematemesis, melena, hematochezia, jaundice or hemorrhoids Genitourinary: Denies dysuria, frequency, urgency, nocturia, hesitancy, discharge, hematuria or flank pain Musculoskeletal: Denies arthralgia, myalgia, stiffness, Jt. Swelling, pain, limp or strain/sprain. Denies Falls. Skin: Denies puritis, rash, hives, warts, acne, eczema or change in skin lesion Neuro: No weakness, tremor, incoordination, spasms, paresthesia or pain Psychiatric: Denies confusion, memory loss or sensory loss. Denies Depression. Endocrine: Denies change in weight, skin, hair change, nocturia, and paresthesia, diabetic polys, visual blurring or hyper / hypo glycemic episodes.  Heme/Lymph: No excessive bleeding, bruising or enlarged lymph nodes.  Physical Exam  BP 136/84 mmHg  Pulse 56  Temp(Src) 97.6 F (36.4 C)  Resp 16  Ht 5' 11.75" (1.822 m)  Wt 212 lb (96.163 kg)  BMI 28.97 kg/m2  General Appearance: Well nourished, in no apparent distress.  Eyes: PERRLA, EOMs, conjunctiva no swelling or erythema, normal fundi and vessels. Sinuses: No frontal/maxillary tenderness ENT/Mouth: EACs patent / TMs  nl. Nares clear without erythema, swelling, mucoid exudates. Oral hygiene is good. No erythema, swelling, or exudate. Tongue normal, non-obstructing. Tonsils not swollen or erythematous. Hearing normal.  Neck: Supple, thyroid  normal. No bruits, nodes or JVD. Respiratory: Respiratory effort normal.  BS equal and clear bilateral without rales, rhonci, wheezing or stridor. Cardio: Heart sounds are normal with regular rate and rhythm and no murmurs, rubs or gallops. Peripheral pulses are normal and equal bilaterally without edema. No aortic or femoral bruits. Chest: symmetric with normal excursions and percussion.  Abdomen: Soft, with Nl bowel sounds. Nontender, no guarding, rebound, hernias, masses, or organomegaly.  Lymphatics: Non tender without lymphadenopathy.  Genitourinary: No hernias.Testes nl. DRE - prostate nl for age - smooth & firm w/o nodules. Musculoskeletal: Full ROM all peripheral extremities, joint stability, 5/5 strength, and normal gait. Skin: Warm and dry without rashes, lesions, cyanosis, clubbing or  ecchymosis.  Neuro: Cranial nerves intact, reflexes equal bilaterally. Normal muscle tone, no cerebellar symptoms. Sensation intact.  Pysch: Alert and oriented X 3 with normal affect, insight and judgment appropriate.   Assessment and Plan  1. Annual Preventative/Screening Exam   - Microalbumin / creatinine urine ratio - EKG 12-Lead - [place], RETROPERITNL ABD,  LTD - POC Hemoccult Bld/Stl  - Urinalysis, Routine w reflex microscopic  - PSA - CBC with Differential/Platelet - BASIC METABOLIC PANEL WITH GFR -  Hepatic function panel - Magnesium - Lipid panel - TSH - Hemoglobin A1c - Insulin, random - VITAMIN D 25 Hydroxy   2. Labile hypertension  - Microalbumin / creatinine urine ratio - EKG 12-Lead - [place], RETROPERITNL ABD,  LTD - TSH  3. Hyperlipidemia, diet controlled  - Lipid panel - TSH  4. Prediabetes  - Hemoglobin A1c - Insulin, random  5. Vitamin D deficiency   6. Hypothyroidism  - VITAMIN D 25 Hydroxy   7. COPD   8. Atherosclerosis of native coronary artery of native heart without angina pectoris   9. BPH/Prostatism  - PSA  10. Screening for rectal cancer  -  POC Hemoccult Bld/Stl   11. Medication management  - Urinalysis, Routine w reflex microscopic - CBC with Differential/Platelet - BASIC METABOLIC PANEL WITH GFR - Hepatic function panel - Magnesium   Continue prudent diet as discussed, weight control, BP monitoring, regular exercise, and medications as discussed.  Discussed med effects and SE's. Routine screening labs and tests as requested with regular follow-up as recommended. Over 40 minutes of exam, counseling, chart review and high complex critical decision making was performed

## 2016-02-09 LAB — URINALYSIS, ROUTINE W REFLEX MICROSCOPIC
Bilirubin Urine: NEGATIVE
Glucose, UA: NEGATIVE
Hgb urine dipstick: NEGATIVE
Ketones, ur: NEGATIVE
Leukocytes, UA: NEGATIVE
Nitrite: NEGATIVE
Protein, ur: NEGATIVE
Specific Gravity, Urine: 1.017 (ref 1.001–1.035)
pH: 6 (ref 5.0–8.0)

## 2016-02-09 LAB — TSH: TSH: 4.54 mIU/L — ABNORMAL HIGH (ref 0.40–4.50)

## 2016-02-09 LAB — MICROALBUMIN / CREATININE URINE RATIO
Creatinine, Urine: 144 mg/dL (ref 20–370)
Microalb Creat Ratio: 23 mcg/mg creat (ref <30)
Microalb, Ur: 3.3 mg/dL

## 2016-02-09 LAB — INSULIN, RANDOM: Insulin: 14 u[IU]/mL (ref 2.0–19.6)

## 2016-02-09 LAB — VITAMIN D 25 HYDROXY (VIT D DEFICIENCY, FRACTURES): Vit D, 25-Hydroxy: 32 ng/mL (ref 30–100)

## 2016-02-09 LAB — PSA: PSA: 0.34 ng/mL (ref <=4.00)

## 2016-02-12 ENCOUNTER — Ambulatory Visit (HOSPITAL_COMMUNITY)
Admission: RE | Admit: 2016-02-12 | Discharge: 2016-02-12 | Disposition: A | Discharge status: Home / Self Care | Payer: Medicare Other | Source: Ambulatory Visit | Attending: Internal Medicine | Admitting: Internal Medicine

## 2016-02-12 DIAGNOSIS — J8410 Pulmonary fibrosis, unspecified: Secondary | ICD-10-CM

## 2016-02-12 DIAGNOSIS — I771 Stricture of artery: Secondary | ICD-10-CM | POA: Insufficient documentation

## 2016-02-12 DIAGNOSIS — J449 Chronic obstructive pulmonary disease, unspecified: Secondary | ICD-10-CM | POA: Insufficient documentation

## 2016-02-12 DIAGNOSIS — Z85118 Personal history of other malignant neoplasm of bronchus and lung: Secondary | ICD-10-CM | POA: Diagnosis not present

## 2016-02-12 DIAGNOSIS — R918 Other nonspecific abnormal finding of lung field: Secondary | ICD-10-CM | POA: Diagnosis not present

## 2016-02-12 MED ORDER — IOPAMIDOL (ISOVUE-300) INJECTION 61%
75.0000 mL | Freq: Once | INTRAVENOUS | Status: AC | PRN
  Administered 2016-02-12: 75 mL via INTRAVENOUS

## 2016-02-16 ENCOUNTER — Other Ambulatory Visit: Payer: Self-pay | Admitting: Internal Medicine

## 2016-03-04 ENCOUNTER — Other Ambulatory Visit: Payer: Self-pay | Admitting: Internal Medicine

## 2016-03-14 ENCOUNTER — Other Ambulatory Visit: Payer: Self-pay

## 2016-03-14 MED ORDER — FINASTERIDE 5 MG PO TABS: 5.0000 mg | ORAL_TABLET | Freq: Every day | ORAL | Status: DC

## 2016-03-17 ENCOUNTER — Ambulatory Visit (HOSPITAL_COMMUNITY)
Admission: RE | Admit: 2016-03-17 | Discharge: 2016-03-17 | Disposition: A | Discharge status: Home / Self Care | Payer: Medicare Other | Source: Ambulatory Visit | Attending: Internal Medicine | Admitting: Internal Medicine

## 2016-03-17 ENCOUNTER — Encounter: Payer: Self-pay | Admitting: Internal Medicine

## 2016-03-17 ENCOUNTER — Ambulatory Visit (INDEPENDENT_AMBULATORY_CARE_PROVIDER_SITE_OTHER): Payer: Medicare Other | Admitting: Internal Medicine

## 2016-03-17 VITALS — BP 120/74 | HR 62 | Temp 98.0°F | Resp 18 | Ht 71.75 in | Wt 210.0 lb

## 2016-03-17 DIAGNOSIS — M25512 Pain in left shoulder: Secondary | ICD-10-CM | POA: Diagnosis not present

## 2016-03-17 DIAGNOSIS — S238XXA Sprain of other specified parts of thorax, initial encounter: Secondary | ICD-10-CM | POA: Diagnosis not present

## 2016-03-17 DIAGNOSIS — S299XXA Unspecified injury of thorax, initial encounter: Secondary | ICD-10-CM | POA: Diagnosis not present

## 2016-03-17 DIAGNOSIS — R079 Chest pain, unspecified: Secondary | ICD-10-CM | POA: Diagnosis not present

## 2016-03-17 DIAGNOSIS — S4991XA Unspecified injury of right shoulder and upper arm, initial encounter: Secondary | ICD-10-CM | POA: Diagnosis not present

## 2016-03-17 MED ORDER — BACLOFEN 10 MG PO TABS: 10.0000 mg | ORAL_TABLET | Freq: Three times a day (TID) | ORAL | Status: DC

## 2016-03-17 MED ORDER — HYDROCODONE-ACETAMINOPHEN 5-325 MG PO TABS: 1.0000 | ORAL_TABLET | ORAL | Status: DC | PRN

## 2016-03-17 NOTE — Progress Notes (Signed)
   Subjective:    Patient ID: Christopher Peck, male    DOB: 23-Mar-1946, 70 y.o.   MRN: [id number]  Shoulder Pain  Pertinent negatives include no fever.  Patient fell in his yard on his left side on Monday.  He reports that he was carrying stuff and fell on the left side.  He reports that yesterday he developed severe pain.  He can only do things from lap to face on that side.  No numbness, color change, no swelling.  He is right hand dominant.  He has been taking tramadol at home and has also been using flexeril.  He did use ice on Tuesday.  He then switched to heat.  That helps minimally.  He reports that he feels like the muscles are spasming.      Review of Systems  Constitutional: Negative for fever, chills and fatigue.  Respiratory: Negative for chest tightness and shortness of breath.   Cardiovascular: Negative for chest pain and palpitations.  Gastrointestinal: Negative for nausea and vomiting.       Objective:   Physical Exam  Constitutional: He is oriented to person, place, and time. He appears well-developed and well-nourished. No distress.  HENT:  Head: Normocephalic.  Mouth/Throat: Oropharynx is clear and moist. No oropharyngeal exudate.  Eyes: Conjunctivae are normal. No scleral icterus.  Neck: Normal range of motion. Neck supple. No JVD present. No thyromegaly present.  Cardiovascular: Normal rate, regular rhythm, normal heart sounds and intact distal pulses.   Pulmonary/Chest: Effort normal and breath sounds normal. No respiratory distress. He has no wheezes. He has no rales. He exhibits no tenderness.  Abdominal: Soft. Bowel sounds are normal. He exhibits no distension and no mass. There is no tenderness. There is no rebound and no guarding.  Musculoskeletal:       Left shoulder: He exhibits decreased range of motion, tenderness, pain, spasm and decreased strength. He exhibits no bony tenderness, no swelling, no effusion, no crepitus, no deformity, no laceration and  normal pulse.       Arms: Lymphadenopathy:    He has no cervical adenopathy.  Neurological: He is alert and oriented to person, place, and time. No cranial nerve deficit. Coordination normal.  Skin: Skin is warm and dry. He is not diaphoretic.  Psychiatric: He has a normal mood and affect. His behavior is normal. Judgment and thought content normal.  Nursing note and vitals reviewed.   Filed Vitals:   03/17/16 0825  BP: 120/74  Pulse: 62  Temp: 98 F (36.7 C)  Resp: 18         Assessment & Plan:    1. Left shoulder pain -impossible to determine whether this is rotator cuff tear secondary to pain -hydrocodone -Stop flexeril try baclofen -rule out fracture of ribs or humerus with xrays -recommended sling bought OTC - DG Shoulder Left; Future - DG Ribs Unilateral Left; Future - Ambulatory referral to Orthopedics

## 2016-04-07 ENCOUNTER — Ambulatory Visit (INDEPENDENT_AMBULATORY_CARE_PROVIDER_SITE_OTHER): Payer: Medicare Other | Admitting: Internal Medicine

## 2016-04-07 VITALS — BP 110/80 | HR 61 | Temp 98.8°F | Wt 206.0 lb

## 2016-04-07 DIAGNOSIS — J441 Chronic obstructive pulmonary disease with (acute) exacerbation: Secondary | ICD-10-CM | POA: Diagnosis not present

## 2016-04-07 DIAGNOSIS — S40012D Contusion of left shoulder, subsequent encounter: Secondary | ICD-10-CM | POA: Diagnosis not present

## 2016-04-07 MED ORDER — PROMETHAZINE-DM 6.25-15 MG/5ML PO SYRP: ORAL_SOLUTION | ORAL | 1 refills | Status: DC

## 2016-04-07 MED ORDER — AZITHROMYCIN 250 MG PO TABS: ORAL_TABLET | ORAL | 0 refills | Status: DC

## 2016-04-07 MED ORDER — PREDNISONE 20 MG PO TABS: ORAL_TABLET | ORAL | 0 refills | Status: DC

## 2016-04-07 MED ORDER — IPRATROPIUM-ALBUTEROL 0.5-2.5 (3) MG/3ML IN SOLN
3.0000 mL | Freq: Once | RESPIRATORY_TRACT | Status: AC
  Administered 2016-04-07: 3 mL via RESPIRATORY_TRACT

## 2016-04-07 NOTE — Patient Instructions (Signed)
Please take prednisone as prescribed.  Please take zpak until it is gone.  Please use the nebulizer every 6 hours to help keep lungs open.  Please use cough syrup up to every 6-8 hrs as needed.  Keep taking your breo.

## 2016-04-07 NOTE — Progress Notes (Signed)
Patient ID: Christopher Peck, male   DOB: [birth year], 70 y.o.   MRN: [id number]  HPI  Patient presents to the office for evaluation of cough.  It has been going on for 4 days.  Patient reports all the time, dry, not related to position.  They also endorse fever, shortness of breath, wheezing and DOE.  Marland Kitchen  They have tried no meds tried.  They report that nothing has worked.  They admits to other sick contacts.  Review of Systems  Constitutional: Positive for malaise/fatigue. Negative for chills and fever.  HENT: Positive for congestion, ear pain, hearing loss and sore throat.   Respiratory: Positive for cough. Negative for sputum production, shortness of breath and wheezing.   Cardiovascular: Negative for chest pain, palpitations and leg swelling.  Neurological: Positive for headaches.    PE:  Vitals:   04/07/16 1514  BP: 110/80  Pulse: 61  Temp: 98.8 F (37.1 C)    General:  Alert and non-toxic, WDWN, NAD HEENT: NCAT, PERLA, EOM normal, no occular discharge or erythema.  Nasal mucosal edema with sinus tenderness to palpation.  Oropharynx clear with minimal oropharyngeal edema and erythema.  Mucous membranes moist and pink. Neck:  Cervical adenopathy Chest:  RRR no MRGs.  Lungs clear to auscultation A&P with no wheezes rhonchi or rales.   Abdomen: +BS x 4 quadrants, soft, non-tender, no guarding, rigidity, or rebound. Skin: warm and dry no rash Neuro: A&Ox4, CN II-XII grossly intact  Assessment and Plan:   1. COPD exacerbation (HCC) -prednisone -zpak -phenergan dm -nebulizer every 6 hours at home - ipratropium-albuterol (DUONEB) 0.5-2.5 (3) MG/3ML nebulizer solution 3 mL; Take 3 mLs by nebulization once.

## 2016-04-27 ENCOUNTER — Telehealth: Payer: Self-pay | Admitting: *Deleted

## 2016-04-27 NOTE — Telephone Encounter (Signed)
I called patient and reminded him he is overdue to colonoscopy.  I offered to schedule appointment for patient and he declined.  Does not want colonoscopy scheduled at this time and advised him to discuss in detail with provider at his next ov.

## 2016-05-07 ENCOUNTER — Other Ambulatory Visit: Payer: Self-pay | Admitting: Internal Medicine

## 2016-05-18 ENCOUNTER — Other Ambulatory Visit: Payer: Self-pay | Admitting: Internal Medicine

## 2016-06-02 ENCOUNTER — Other Ambulatory Visit: Payer: Self-pay | Admitting: Internal Medicine

## 2016-08-09 ENCOUNTER — Encounter: Payer: Self-pay | Admitting: Internal Medicine

## 2016-08-09 ENCOUNTER — Ambulatory Visit (INDEPENDENT_AMBULATORY_CARE_PROVIDER_SITE_OTHER): Payer: Medicare Other | Admitting: Internal Medicine

## 2016-08-09 VITALS — BP 108/64 | HR 66 | Temp 98.2°F | Resp 16 | Ht 71.75 in | Wt 208.0 lb

## 2016-08-09 DIAGNOSIS — G8929 Other chronic pain: Secondary | ICD-10-CM

## 2016-08-09 DIAGNOSIS — E559 Vitamin D deficiency, unspecified: Secondary | ICD-10-CM | POA: Diagnosis not present

## 2016-08-09 DIAGNOSIS — M25512 Pain in left shoulder: Secondary | ICD-10-CM | POA: Diagnosis not present

## 2016-08-09 DIAGNOSIS — J701 Chronic and other pulmonary manifestations due to radiation: Secondary | ICD-10-CM

## 2016-08-09 DIAGNOSIS — R7303 Prediabetes: Secondary | ICD-10-CM

## 2016-08-09 DIAGNOSIS — J449 Chronic obstructive pulmonary disease, unspecified: Secondary | ICD-10-CM | POA: Diagnosis not present

## 2016-08-09 DIAGNOSIS — E782 Mixed hyperlipidemia: Secondary | ICD-10-CM | POA: Diagnosis not present

## 2016-08-09 DIAGNOSIS — E039 Hypothyroidism, unspecified: Secondary | ICD-10-CM | POA: Diagnosis not present

## 2016-08-09 DIAGNOSIS — I10 Essential (primary) hypertension: Secondary | ICD-10-CM | POA: Diagnosis not present

## 2016-08-09 DIAGNOSIS — R0989 Other specified symptoms and signs involving the circulatory and respiratory systems: Secondary | ICD-10-CM

## 2016-08-09 DIAGNOSIS — Z79899 Other long term (current) drug therapy: Secondary | ICD-10-CM

## 2016-08-09 LAB — CBC WITH DIFFERENTIAL/PLATELET
Basophils Absolute: 59 cells/uL (ref 0–200)
Basophils Relative: 1 %
Eosinophils Absolute: 177 cells/uL (ref 15–500)
Eosinophils Relative: 3 %
HCT: 46.5 % (ref 38.5–50.0)
Hemoglobin: 15.5 g/dL (ref 13.2–17.1)
Lymphocytes Relative: 25 %
Lymphs Abs: 1475 cells/uL (ref 850–3900)
MCH: 32.5 pg (ref 27.0–33.0)
MCHC: 33.3 g/dL (ref 32.0–36.0)
MCV: 97.5 fL (ref 80.0–100.0)
MPV: 9.1 fL (ref 7.5–12.5)
Monocytes Absolute: 885 cells/uL (ref 200–950)
Monocytes Relative: 15 %
Neutro Abs: 3304 cells/uL (ref 1500–7800)
Neutrophils Relative %: 56 %
Platelets: 240 10*3/uL (ref 140–400)
RBC: 4.77 MIL/uL (ref 4.20–5.80)
RDW: 13 % (ref 11.0–15.0)
WBC: 5.9 10*3/uL (ref 3.8–10.8)

## 2016-08-09 LAB — LIPID PANEL
Cholesterol: 189 mg/dL (ref <200)
HDL: 53 mg/dL (ref >40)
LDL Cholesterol: 107 mg/dL — ABNORMAL HIGH (ref <100)
Total CHOL/HDL Ratio: 3.6 Ratio (ref <5.0)
Triglycerides: 144 mg/dL (ref <150)
VLDL: 29 mg/dL (ref <30)

## 2016-08-09 LAB — HEPATIC FUNCTION PANEL
ALT: 17 U/L (ref 9–46)
AST: 33 U/L (ref 10–35)
Albumin: 3.9 g/dL (ref 3.6–5.1)
Alkaline Phosphatase: 67 U/L (ref 40–115)
Bilirubin, Direct: 0.1 mg/dL (ref <=0.2)
Indirect Bilirubin: 0.3 mg/dL (ref 0.2–1.2)
Total Bilirubin: 0.4 mg/dL (ref 0.2–1.2)
Total Protein: 6.2 g/dL (ref 6.1–8.1)

## 2016-08-09 LAB — BASIC METABOLIC PANEL WITH GFR
BUN: 16 mg/dL (ref 7–25)
CO2: 29 mmol/L (ref 20–31)
Calcium: 9.1 mg/dL (ref 8.6–10.3)
Chloride: 104 mmol/L (ref 98–110)
Creat: 1.16 mg/dL (ref 0.70–1.25)
GFR, Est African American: 74 mL/min (ref >=60)
GFR, Est Non African American: 64 mL/min (ref >=60)
Glucose, Bld: 85 mg/dL (ref 65–99)
Potassium: 4.3 mmol/L (ref 3.5–5.3)
Sodium: 140 mmol/L (ref 135–146)

## 2016-08-09 LAB — TSH: TSH: 1.28 mIU/L (ref 0.40–4.50)

## 2016-08-09 NOTE — Progress Notes (Signed)
Assessment and Plan:  Hypertension:  -Continue medication,  -monitor blood pressure at home.  -Continue DASH diet.   -Reminder to go to the ER if any CP, SOB, nausea, dizziness, severe HA, changes vision/speech, left arm numbness and tingling, and jaw pain.  Cholesterol: -Continue diet and exercise.  -Check cholesterol.   Pre-diabetes: -Continue diet and exercise.  -Check A1C  Vitamin D Def: -check level -continue medications.   Pulmonary Fibrosis -cont breo -cont albuterol -consider possible triple therapy at next visit  COPD -see above  CAD -cont BP control -cont statin  Hypothyroidism -cont levothyroxine -TSH level  Continue diet and meds as discussed. Further disposition pending results of labs.  HPI 70 y.o. male  presents for 3 month follow up with hypertension, hyperlipidemia, prediabetes and vitamin D.   His blood pressure has been controlled at home, today their BP is BP: 108/64.   He does not workout. He denies chest pain, shortness of breath, dizziness.     He is not on cholesterol medication and denies myalgias. His cholesterol is at goal. The cholesterol last visit was:   Lab Results  Component Value Date   CHOL 204 (H) 02/08/2016   HDL 54 02/08/2016   LDLCALC 118 02/08/2016   TRIG 161 (H) 02/08/2016   CHOLHDL 3.8 02/08/2016     He has been working on diet and exercise for prediabetes, and denies foot ulcerations, hyperglycemia, hypoglycemia , increased appetite, nausea, paresthesia of the feet, polydipsia, polyuria, visual disturbances, vomiting and weight loss. Last A1C in the office was:  Lab Results  Component Value Date   HGBA1C 5.6 02/08/2016    Patient is on Vitamin D supplement.  Lab Results  Component Value Date   VD25OH 32 02/08/2016     He feels like his pulmonary fibrosis has been getting worse. The breo helps.   He is taking his levothyroxine.  He takes it first thing in the morning on an empty stomach and waits 45  minutes.  He reports that his shoulder is still bothering him, but he says orthopedics can't help.      Current Medications:  Current Outpatient Prescriptions on File Prior to Visit  Medication Sig Dispense Refill  . baclofen (LIORESAL) 10 MG tablet Take 1 tablet (10 mg total) by mouth 3 (three) times daily. 30 tablet 1  . BREO ELLIPTA 100-25 MCG/INH AEPB INHALE 1 PUFF INTO THE LUNGS DAILY. 180 each 1  . cholecalciferol (VITAMIN D) 1000 UNITS tablet Take 1,000 Units by mouth daily.    . citalopram (CELEXA) 20 MG tablet TAKE 1 TABLET BY MOUTH ONCE DAILY FOR MOOD 90 tablet 3  . finasteride (PROSCAR) 5 MG tablet Take 1 tablet (5 mg total) by mouth daily. 90 tablet 1  . levothyroxine (SYNTHROID, LEVOTHROID) 125 MCG tablet TAKE 1 TABLET BY MOUTH EVERY DAY 90 tablet 2  . Magnesium 500 MG TABS Take by mouth. daily    . PROAIR HFA 108 (90 Base) MCG/ACT inhaler INHALE 2 PUFFS BY MOUTH EVERY 4 HOURS AS NEEDED 8.5 Inhaler 1  . tiZANidine (ZANAFLEX) 4 MG tablet TAKE 1 TABLET BY MOUTH 3 TIMES A DAY 90 tablet 1  . traMADol (ULTRAM) 50 MG tablet TAKE 1 TABLET BY MOUTH 3 TIMES A DAY AS NEEDED FOR PAIN 90 tablet 3   No current facility-administered medications on file prior to visit.     Medical History:  Past Medical History:  Diagnosis Date  . Cancer ([place])   . COPD (chronic obstructive pulmonary disease) (  [place])   . Emphysema of lung ([place])   . Hyperlipidemia   . Hypertension   . Hypothyroid   . Other abnormal glucose     Allergies:  Allergies  Allergen Reactions  . Spiriva [Tiotropium Bromide Monohydrate]      Review of Systems:  Review of Systems  Constitutional: Negative for chills, fever and malaise/fatigue.  HENT: Negative for congestion, ear pain and sore throat.   Eyes: Negative.   Respiratory: Negative for cough, shortness of breath and wheezing.   Cardiovascular: Negative for chest pain, palpitations and leg swelling.  Gastrointestinal: Negative for abdominal pain, blood in  stool, constipation, diarrhea, heartburn and melena.  Genitourinary: Negative.   Skin: Negative.   Neurological: Negative for dizziness, sensory change, loss of consciousness and headaches.  Psychiatric/Behavioral: Negative for depression. The patient is not nervous/anxious and does not have insomnia.     Family history- Review and unchanged  Social history- Review and unchanged  Physical Exam: BP 108/64   Pulse 66   Temp 98.2 F (36.8 C) (Temporal)   Resp 16   Ht 5' 11.75" (1.822 m)   Wt 208 lb (94.3 kg)   BMI 28.41 kg/m  Wt Readings from Last 3 Encounters:  08/09/16 208 lb (94.3 kg)  04/07/16 206 lb (93.4 kg)  03/17/16 210 lb (95.3 kg)    General Appearance: Well nourished well developed, in no apparent distress. Eyes: PERRLA, EOMs, conjunctiva no swelling or erythema ENT/Mouth: Ear canals normal without obstruction, swelling, erythma, discharge.  TMs normal bilaterally.  Oropharynx moist, clear, without exudate, or postoropharyngeal swelling. Neck: Supple, thyroid normal,no cervical adenopathy  Respiratory: Respiratory effort normal, Breath sounds clear A&P without rhonchi, wheeze, or rale.  No retractions, no accessory usage. Cardio: RRR with no MRGs. Brisk peripheral pulses without edema.  Abdomen: Soft, + BS,  Non tender, no guarding, rebound, hernias, masses. Musculoskeletal: Full ROM, 5/5 strength, Normal gait Skin: Warm, dry without rashes, lesions, ecchymosis.  Neuro: Awake and oriented X 3, Cranial nerves intact. Normal muscle tone, no cerebellar symptoms. Psych: Normal affect, Insight and Judgment appropriate.    Starlyn Skeans, PA-C 9:04 AM [facility]

## 2016-08-23 ENCOUNTER — Other Ambulatory Visit: Payer: Self-pay | Admitting: Internal Medicine

## 2016-09-01 ENCOUNTER — Ambulatory Visit (INDEPENDENT_AMBULATORY_CARE_PROVIDER_SITE_OTHER): Payer: Medicare Other | Admitting: Physician Assistant

## 2016-09-01 ENCOUNTER — Encounter: Payer: Self-pay | Admitting: Physician Assistant

## 2016-09-01 VITALS — BP 118/74 | HR 91 | Temp 97.3°F | Resp 14 | Ht 71.5 in | Wt 214.0 lb

## 2016-09-01 DIAGNOSIS — R079 Chest pain, unspecified: Secondary | ICD-10-CM

## 2016-09-01 DIAGNOSIS — J449 Chronic obstructive pulmonary disease, unspecified: Secondary | ICD-10-CM | POA: Diagnosis not present

## 2016-09-01 NOTE — Patient Instructions (Signed)
Stop the breo If you have any more chest discomfort without the breo we should make an appointment with a heart doctor Add a low dose '81mg'$  aspirin  Go to the ER if any CP, SOB, nausea, dizziness, severe HA, changes vision/speech   Angina Pectoris Angina pectoris, often called angina, is extreme discomfort in the chest, neck, or arm. This is caused by a lack of blood in the middle and thickest layer of the heart wall (myocardium). There are four types of angina:  Stable angina. Stable angina usually occurs in episodes of predictable frequency and duration. It is usually brought on by physical activity, stress, or excitement. Stable angina usually lasts a few minutes and can often be relieved by a medicine that you place under your tongue. This medicine is called sublingual nitroglycerin.  Unstable angina. Unstable angina can occur even when you are doing little or no physical activity. It can even occur while you are sleeping or when you are at rest. It can suddenly increase in severity or frequency. It may not be relieved by sublingual nitroglycerin, and it can last up to 30 minutes.  Microvascular angina. This type of angina is caused by a disorder of tiny blood vessels called arterioles. Microvascular angina is more common in women. The pain may be more severe and last longer than other types of angina pectoris.  Prinzmetal or variant angina. This type of angina pectoris is rare and usually occurs when you are doing little or no physical activity. It especially occurs in the early morning hours. What are the causes? Atherosclerosis is the cause of angina. This is the buildup of fat and cholesterol (plaque) on the inside of the arteries. Over time, the plaque may narrow or block the artery, and this will lessen blood flow to the heart. Plaque can also become weak and break off within a coronary artery to form a clot and cause a sudden blockage. What increases the risk? Risk factors common to  both men and women include:  High cholesterol levels.  High blood pressure (hypertension).  Tobacco use.  Diabetes.  Family history of angina.  Obesity.  Lack of exercise.  A diet high in saturated fats. Women are at greater risk for angina if they are:  Over age 78.  Postmenopausal. What are the signs or symptoms? Many people do not experience any symptoms during the early stages of angina. As the condition progresses, symptoms common to both men and women may include:  Chest pain.  The pain can be described as a crushing or squeezing in the chest, or a tightness, pressure, fullness, or heaviness in the chest.  The pain can last more than a few minutes, or it can stop and recur.  Pain in the arms, neck, jaw, or back.  Unexplained heartburn or indigestion.  Shortness of breath.  Nausea.  Sudden cold sweats.  Sudden light-headedness. Many women have chest discomfort and some of the other symptoms. However, women often have different (atypical) symptoms, such as:  Fatigue.  Unexplained feelings of nervousness or anxiety.  Unexplained weakness.  Dizziness or fainting. Sometimes, women may have angina without any symptoms. How is this diagnosed? Tests to diagnose angina may include:  ECG (electrocardiogram).  Exercise stress test. This looks for signs of blockage when the heart is being exercised.  Pharmacologic stress test. This test looks for signs of blockage when the heart is being stressed with a medicine.  Blood tests.  Coronary angiogram. This is a procedure to look at  the coronary arteries to see if there is any blockage. How is this treated? The treatment of angina may include the following:  Healthy behavioral changes to reduce or control risk factors.  Medicine.  Coronary stenting.A stent helps to keep an artery open.  Coronary angioplasty. This procedure widens a narrowed or blocked artery.  Coronary arterybypass surgery. This will  allow your blood to pass the blockage (bypass) to reach your heart. Follow these instructions at home:  Take medicines only as directed by your health care provider.  Do not take the following medicines unless your health care provider approves:  Nonsteroidal anti-inflammatory drugs (NSAIDs), such as ibuprofen, naproxen, or celecoxib.  Vitamin supplements that contain vitamin A, vitamin E, or both.  Hormone replacement therapy that contains estrogen with or without progestin.  Manage other health conditions such as hypertension and diabetes as directed by your health care provider.  Follow a heart-healthy diet. A dietitian can help to educate you about healthy food options and changes.  Use healthy cooking methods such as roasting, grilling, broiling, baking, poaching, steaming, or stir-frying. Talk to a dietitian to learn more about healthy cooking methods.  Follow an exercise program approved by your health care provider.  Maintain a healthy weight. Lose weight as approved by your health care provider.  Plan rest periods when fatigued.  Learn to manage stress.  Do not use any tobacco products, including cigarettes, chewing tobacco, or electronic cigarettes. If you need help quitting, ask your health care provider.  If you drink alcohol, and your health care provider approves, limit your alcohol intake to no more than 1 drink per day. One drink equals 12 ounces of beer, 5 ounces of wine, or 1 ounces of hard liquor.  Stop illegal drug use.  Keep all follow-up visits as directed by your health care provider. This is important. Get help right away if:  You have pain in your chest, neck, arm, jaw, stomach, or back that lasts more than a few minutes, is recurring, or is unrelieved by taking sublingualnitroglycerin.  You have profuse sweating without cause.  You have unexplained:  Heartburn or indigestion.  Shortness of breath or difficulty breathing.  Nausea or  vomiting.  Fatigue.  Feelings of nervousness or anxiety.  Weakness.  Diarrhea.  You have sudden light-headedness or dizziness.  You faint. These symptoms may represent a serious problem that is an emergency. Do not wait to see if the symptoms will go away. Get medical help right away. Call your local emergency services (911 in the [place] Do not drive yourself to the hospital.  This information is not intended to replace advice given to you by your health care provider. Make sure you discuss any questions you have with your health care provider. Document Released: 08/22/2005 Document Revised: 02/03/2016 Document Reviewed: 12/24/2013 Elsevier Interactive Patient Education  2017 [employer].

## 2016-09-01 NOTE — Progress Notes (Signed)
Subjective:    Patient ID: Christopher Peck, male    DOB: 1946/05/17, 70 y.o.   MRN: [id number]  HPI 70 y.o. 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 Normally worse in the morning, describes it like a cramping pain across his chest, no radiation, no accompaniments, can work out in his yard without any discomfort,   Has been tried on the brovana, anoro and breo and he did not tolerate them all.   Blood pressure 118/74, pulse 91, temperature 97.3 F (36.3 C), resp. rate 14, height 5' 11.5" (1.816 m), weight 214 lb (97.1 kg), SpO2 98 %.  Medications Current Outpatient Prescriptions on File Prior to Visit  Medication Sig  . baclofen (LIORESAL) 10 MG tablet Take 1 tablet (10 mg total) by mouth 3 (three) times daily.  Marland Kitchen BREO ELLIPTA 100-25 MCG/INH AEPB INHALE 1 PUFF INTO THE LUNGS DAILY.  . cholecalciferol (VITAMIN D) 1000 UNITS tablet Take 1,000 Units by mouth daily.  . citalopram (CELEXA) 20 MG tablet TAKE 1 TABLET BY MOUTH ONCE DAILY FOR MOOD  . finasteride (PROSCAR) 5 MG tablet Take 1 tablet (5 mg total) by mouth daily.  Marland Kitchen levothyroxine (SYNTHROID, LEVOTHROID) 125 MCG tablet TAKE 1 TABLET BY MOUTH EVERY DAY  . Magnesium 500 MG TABS Take by mouth. daily  . PROAIR HFA 108 (90 Base) MCG/ACT inhaler INHALE 2 PUFFS BY MOUTH EVERY 4 HOURS AS NEEDED  . tiZANidine (ZANAFLEX) 4 MG tablet TAKE 1 TABLET BY MOUTH 3 TIMES A DAY  . traMADol (ULTRAM) 50 MG tablet TAKE 1 TABLET BY MOUTH 3 TIMES A DAY AS NEEDED   No current facility-administered medications on file prior to visit.     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  Review of Systems  Constitutional: Negative for chills, fatigue and fever.  Respiratory: Positive for shortness of breath (at baseline). Negative for chest tightness.   Cardiovascular: Positive for chest pain. Negative for palpitations.  Gastrointestinal: Negative for nausea and vomiting.  Musculoskeletal: Negative.   Skin: Negative.  Negative for rash.  Neurological: Negative.        Objective:   Physical Exam  Constitutional: He is oriented to person, place, and time. He appears well-developed. He appears cachectic. No distress.  HENT:  Head: Normocephalic and atraumatic.  Right Ear: External ear normal.  Left Ear: External ear normal.  Mouth/Throat: No oropharyngeal exudate.  Eyes: Conjunctivae are normal. Pupils are equal, round, and reactive to light.  Neck: Normal range of motion. Neck supple.  Cardiovascular: Normal rate, regular rhythm and normal heart sounds.  Exam reveals no gallop and no friction rub.   Pulmonary/Chest: Effort normal and breath sounds normal. No respiratory distress. He has no wheezes. He has no rales. He exhibits no tenderness.  Abdominal: Soft. Bowel sounds are normal. There is no tenderness.  Musculoskeletal: Normal range of motion. He exhibits no edema (no edema, warmth, tenderness bilateral legs) or tenderness.  Lymphadenopathy:    He has no cervical adenopathy.  Neurological: He is alert and oriented to person, place, and time. No cranial nerve deficit.  Skin: Skin is warm and dry. No rash (no rash) noted. He is not diaphoretic.       Assessment & Plan:  Chest pain- Patient has several risk factors for CAD however very atypical pain, non exertional, no accompaniments, some concern with in the AM but never long than 3 mins- ? Angina however patient declines labs/EKG/referral/breo or other breathing medications at this time. He  understands that this can be his heart however declines any work up despite understanding the risk of death/dying- he states he will come in for evaluation if he continues to have pain getting off the breo or if he has any other symptoms with it, will add on '81mg'$  ASA.

## 2016-09-18 IMAGING — CR DG SHOULDER 2+V*L*
4 series · 4 of 4 positions shown · non-contrast
Comparison: CT chest of 02/12/2016

CLINICAL DATA: Left shoulder pain, recent fall

EXAM:
LEFT SHOULDER - 2+ VIEW

[shoulder ap]
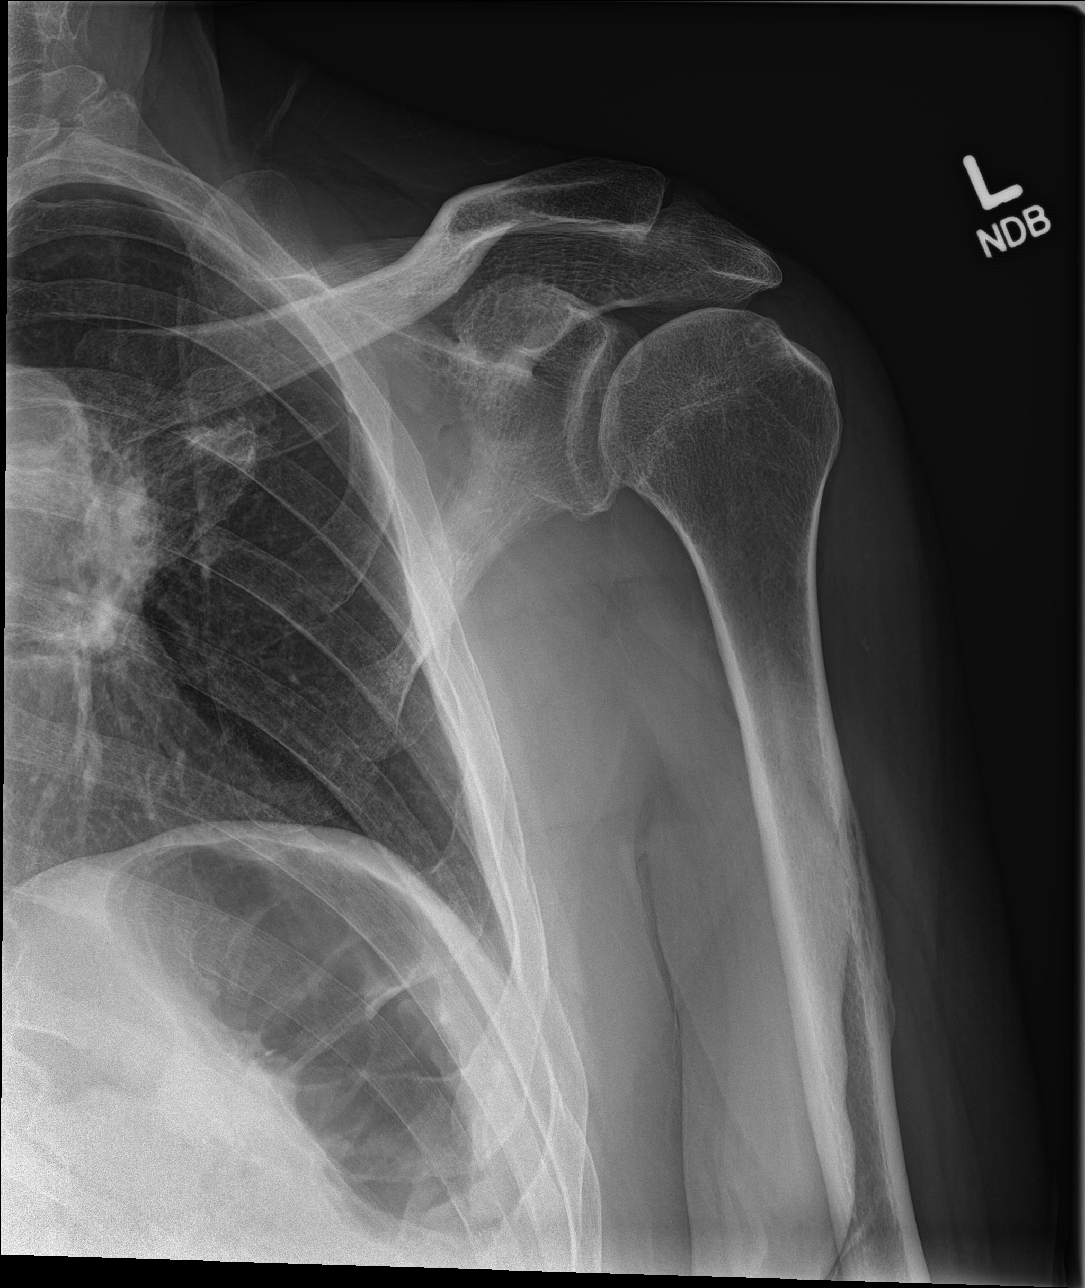

[shoulder y view]
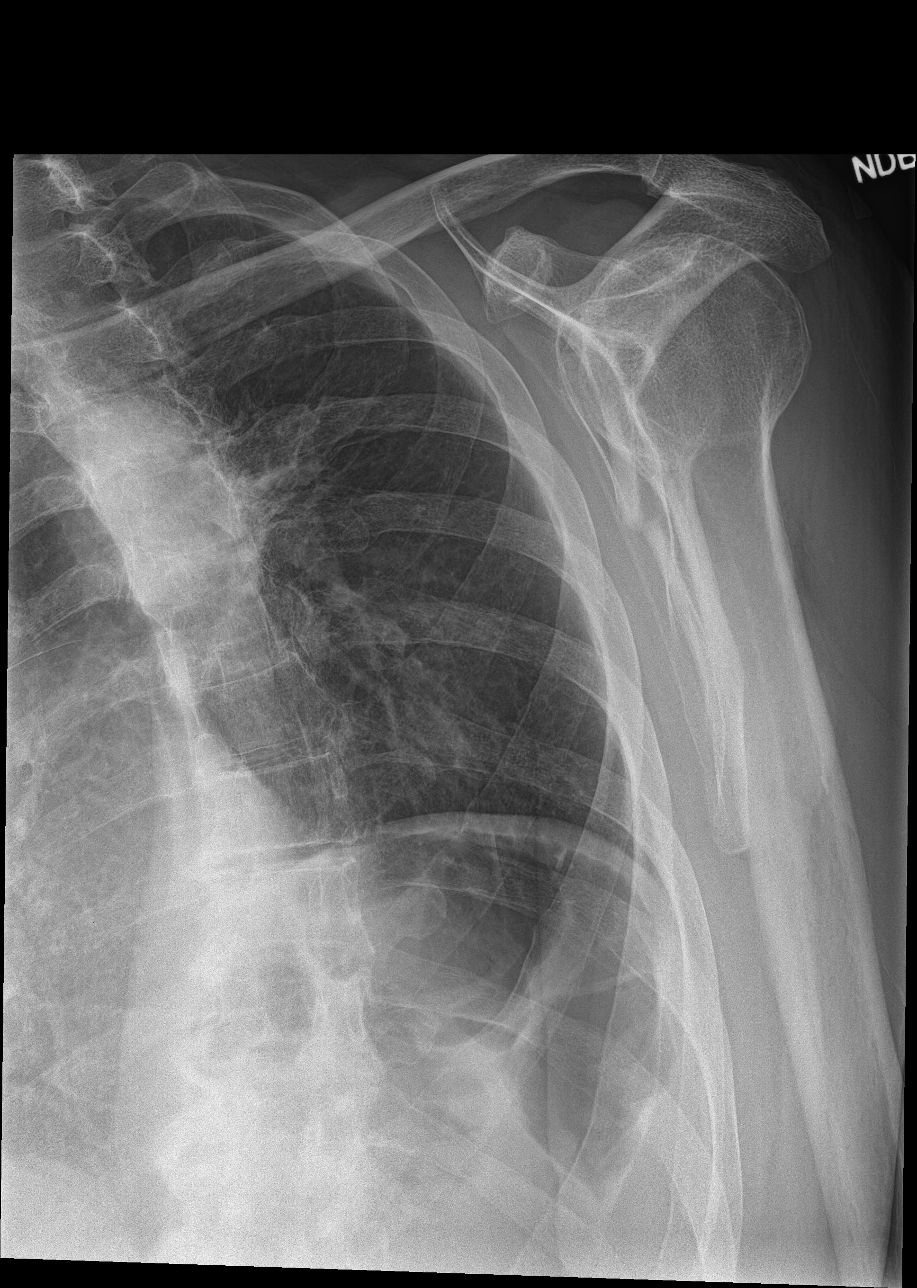

[shoulder axillary (1 of 2)]
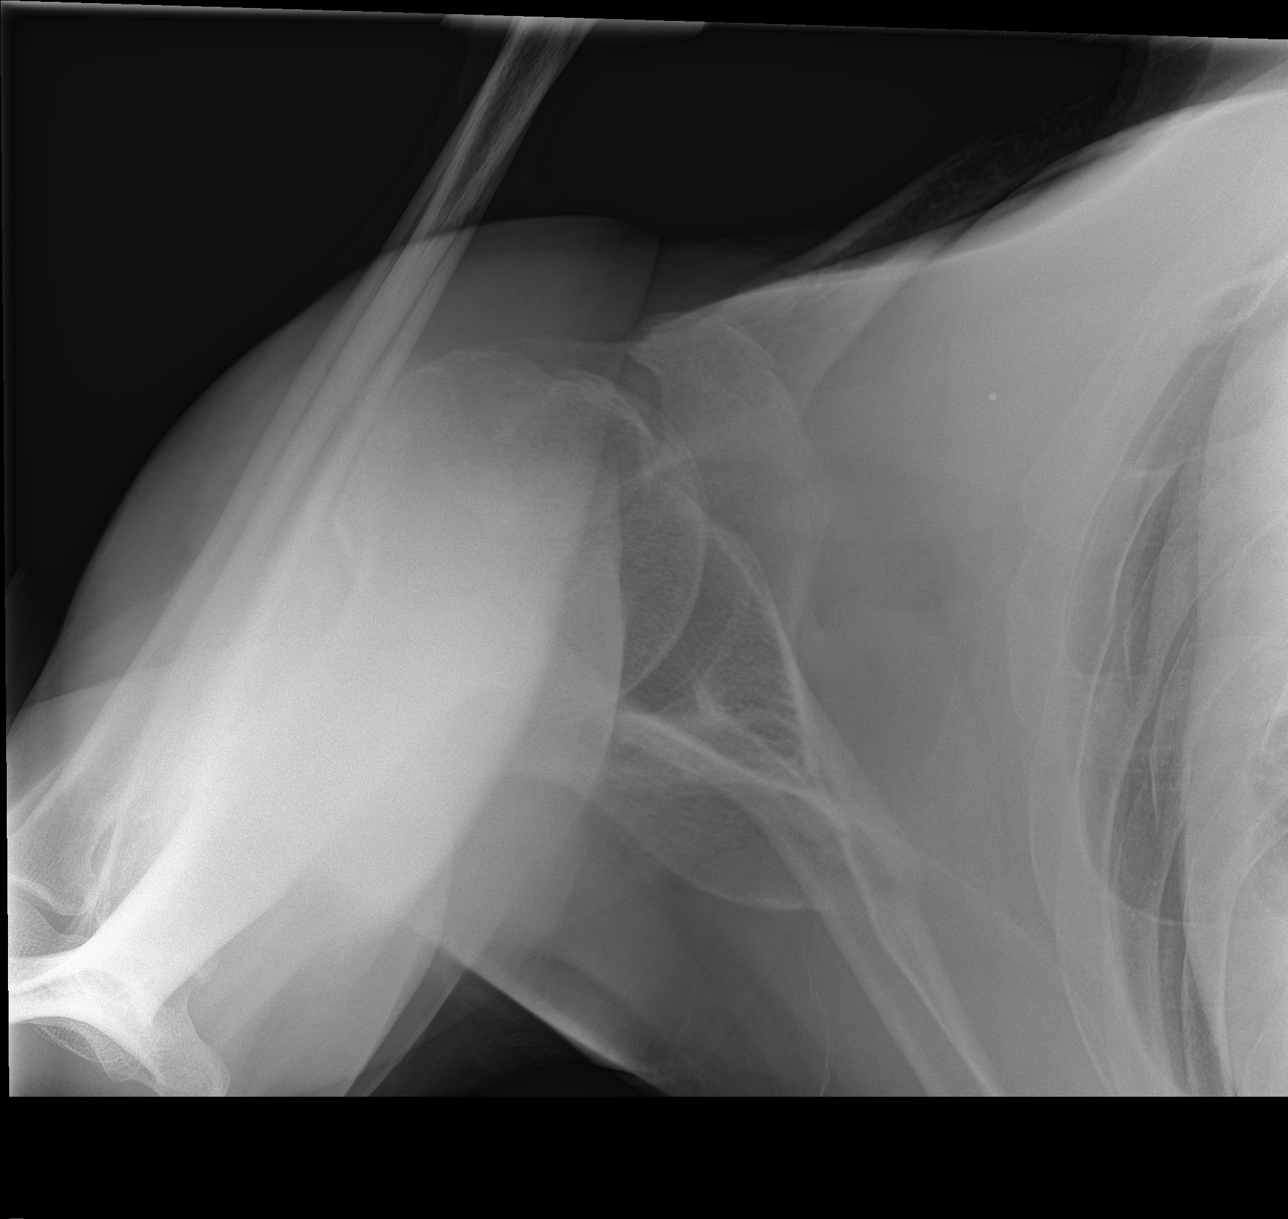

[shoulder axillary (2 of 2)]
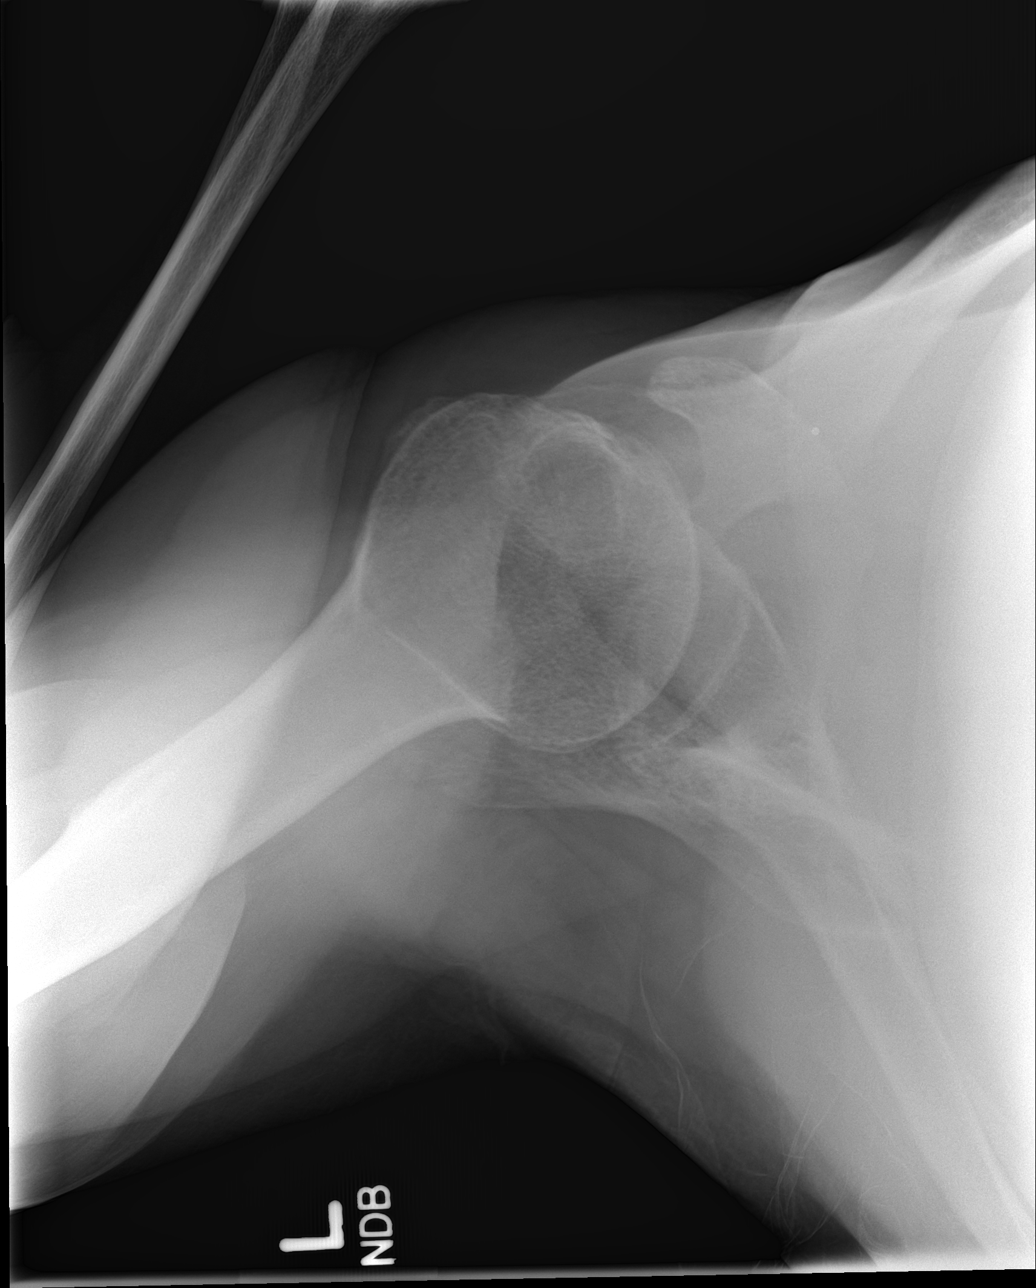

[4 of 4 positions shown; findings below may reference images not displayed]

FINDINGS: The left humeral head is in normal position. The glenohumeral joint
space appears normal. No acute fracture is seen. As noted on the
left rib detail films, there is slight cortical irregularity of the
anterolateral left fifth and sixth ribs vascular could represent
fracture.
IMPRESSION: 1. Negative left shoulder.
2. Cannot exclude subtle nondisplaced fractures of the anterolateral
left fifth and sixth ribs.

## 2016-09-18 IMAGING — CR DG RIBS 2V*L*
3 series · 3 of 3 positions shown · non-contrast
Comparison: CT chest of 02/12/2016 and 01/23/2015

CLINICAL DATA: Recent fall, left chest pain

EXAM:
LEFT RIBS - 2 VIEW

[rib pa]
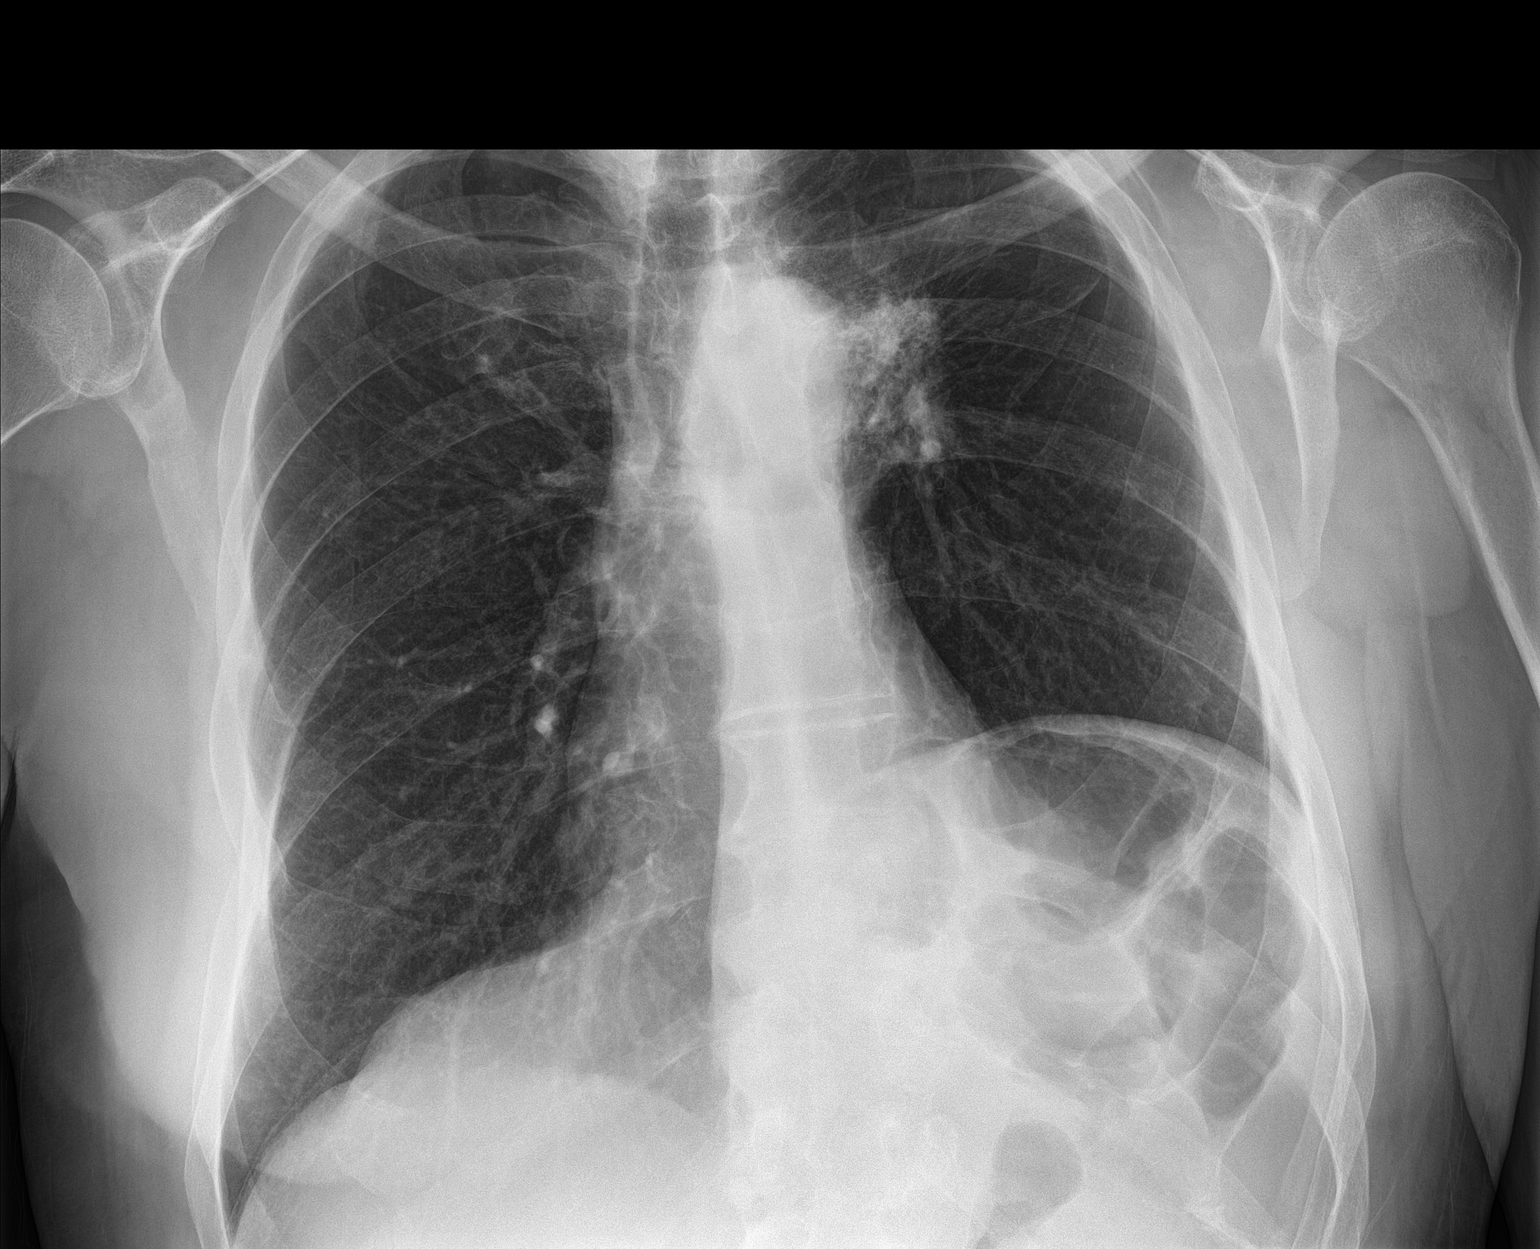

[rib obl (1 of 2)]
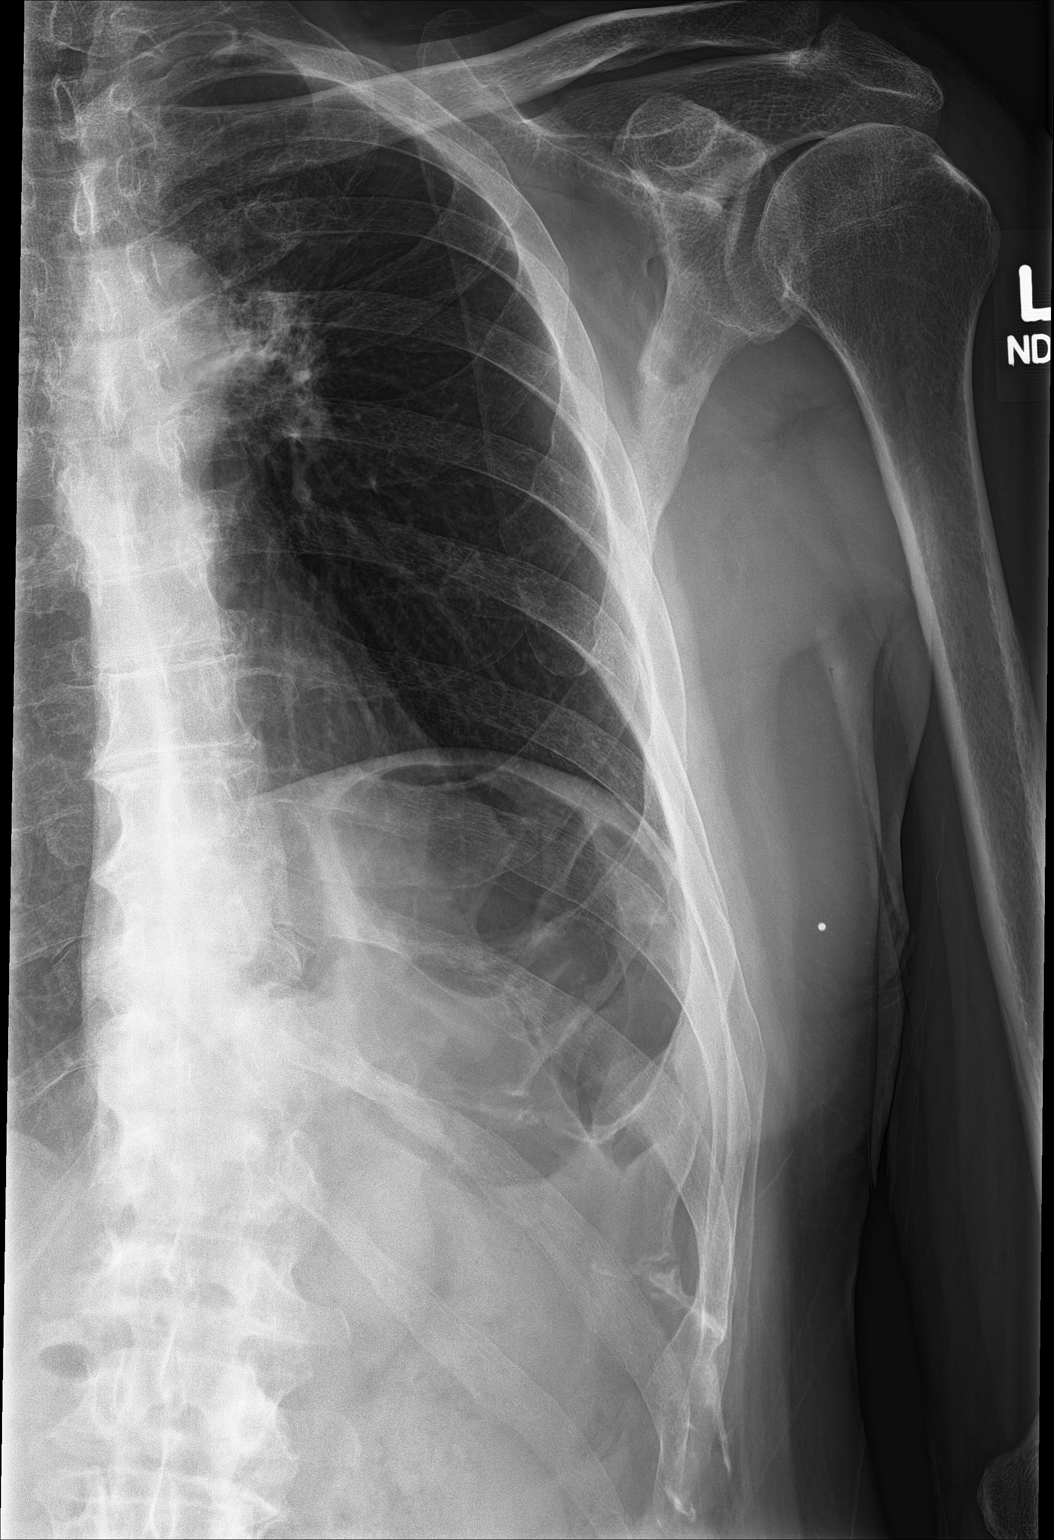

[rib obl (2 of 2)]
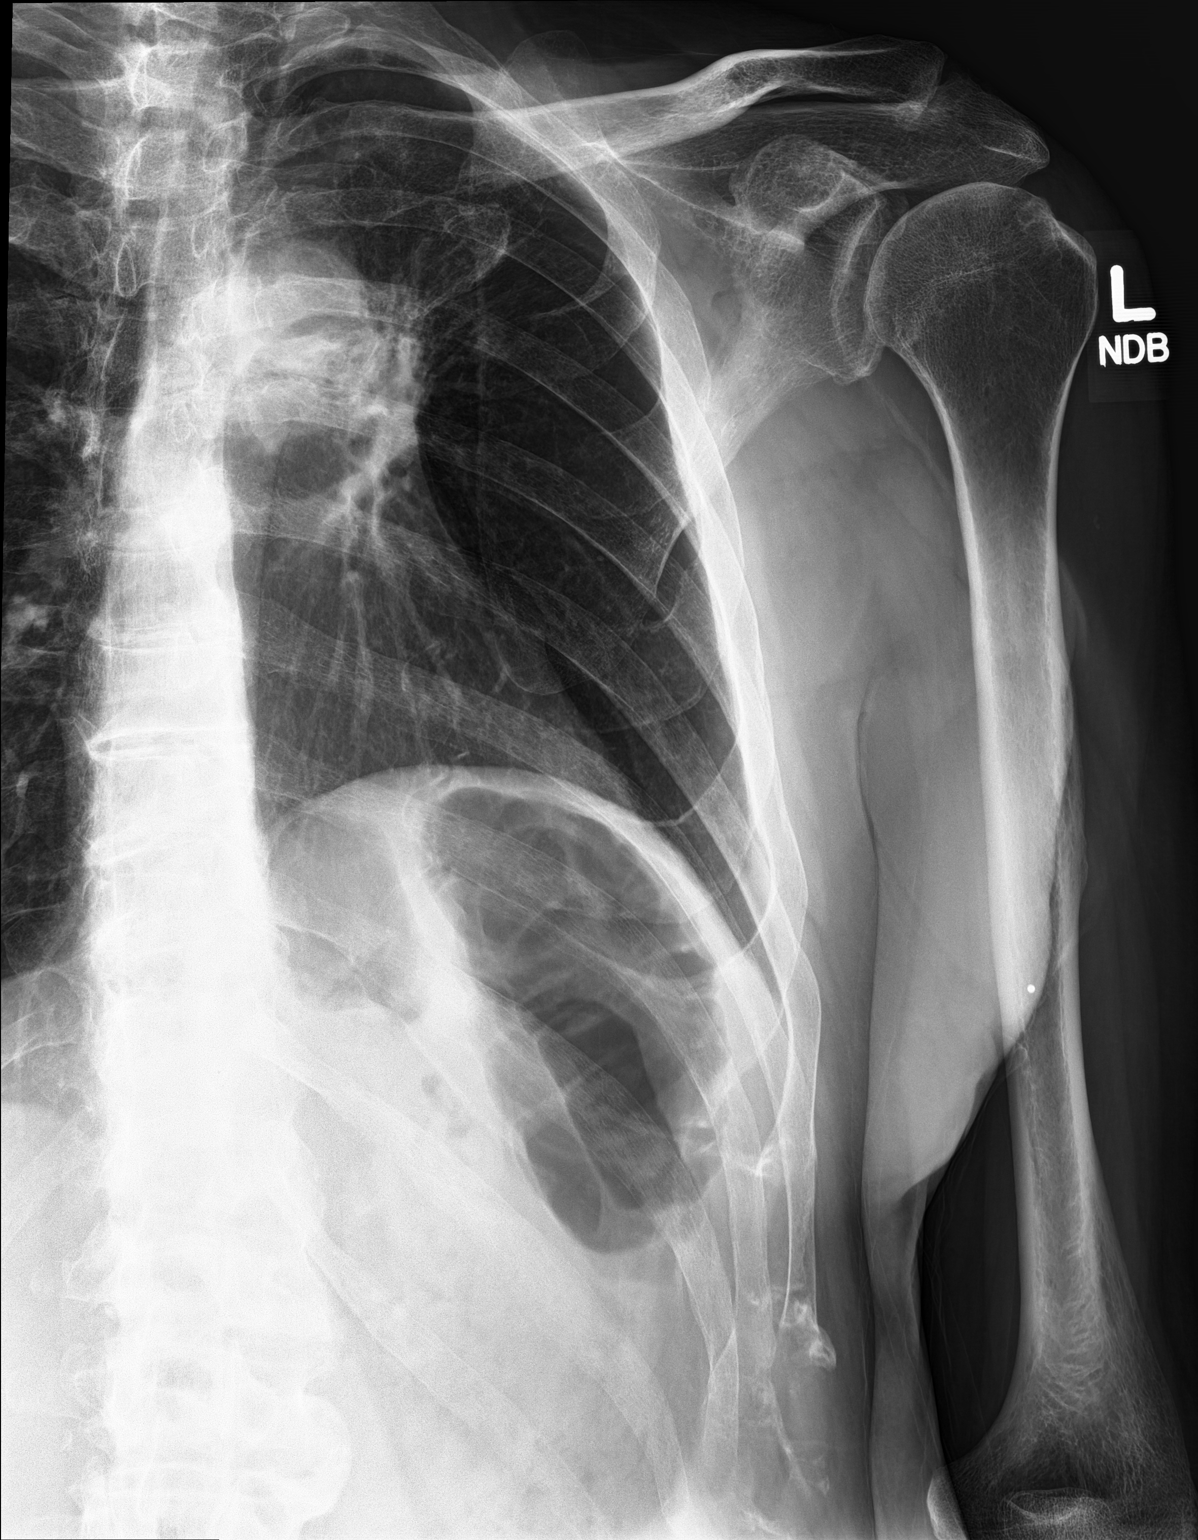

[3 of 3 positions shown; findings below may reference images not displayed]

FINDINGS: The abnormal appearance of the left suprahilar region is stable
consistent with radiation fibrosis. The lungs are clear. No
pneumothorax or effusion is seen. The heart is within normal limits
in size.

On left rib detail films there is slight irregularity of the lateral
left fifth and sixth ribs. This could be due to prior trauma but
acute fracture is difficult to exclude on the images obtained. The
left humeral head is in normal position.
IMPRESSION: Cannot exclude fracture of the left lateral fifth and sixth ribs.
These changes may be old but acute fracture is difficult to exclude.
No old rib fracture is seen on CT chest of 02/12/2016.

## 2016-09-25 ENCOUNTER — Other Ambulatory Visit: Payer: Self-pay | Admitting: Internal Medicine

## 2016-10-24 DIAGNOSIS — J449 Chronic obstructive pulmonary disease, unspecified: Secondary | ICD-10-CM | POA: Diagnosis not present

## 2016-11-08 ENCOUNTER — Ambulatory Visit (INDEPENDENT_AMBULATORY_CARE_PROVIDER_SITE_OTHER): Payer: Medicare Other | Admitting: Internal Medicine

## 2016-11-08 ENCOUNTER — Encounter: Payer: Self-pay | Admitting: Internal Medicine

## 2016-11-08 VITALS — BP 106/64 | HR 58 | Temp 98.0°F | Resp 16 | Ht 71.5 in | Wt 210.0 lb

## 2016-11-08 DIAGNOSIS — C3490 Malignant neoplasm of unspecified part of unspecified bronchus or lung: Secondary | ICD-10-CM

## 2016-11-08 DIAGNOSIS — Z79899 Other long term (current) drug therapy: Secondary | ICD-10-CM

## 2016-11-08 DIAGNOSIS — E782 Mixed hyperlipidemia: Secondary | ICD-10-CM | POA: Diagnosis not present

## 2016-11-08 DIAGNOSIS — I10 Essential (primary) hypertension: Secondary | ICD-10-CM

## 2016-11-08 DIAGNOSIS — R6889 Other general symptoms and signs: Secondary | ICD-10-CM | POA: Diagnosis not present

## 2016-11-08 DIAGNOSIS — Z0000 Encounter for general adult medical examination without abnormal findings: Secondary | ICD-10-CM

## 2016-11-08 DIAGNOSIS — Z8719 Personal history of other diseases of the digestive system: Secondary | ICD-10-CM

## 2016-11-08 DIAGNOSIS — Z9889 Other specified postprocedural states: Secondary | ICD-10-CM

## 2016-11-08 DIAGNOSIS — R0989 Other specified symptoms and signs involving the circulatory and respiratory systems: Secondary | ICD-10-CM

## 2016-11-08 DIAGNOSIS — E559 Vitamin D deficiency, unspecified: Secondary | ICD-10-CM

## 2016-11-08 DIAGNOSIS — J449 Chronic obstructive pulmonary disease, unspecified: Secondary | ICD-10-CM

## 2016-11-08 DIAGNOSIS — Z0001 Encounter for general adult medical examination with abnormal findings: Secondary | ICD-10-CM

## 2016-11-08 DIAGNOSIS — J701 Chronic and other pulmonary manifestations due to radiation: Secondary | ICD-10-CM | POA: Diagnosis not present

## 2016-11-08 DIAGNOSIS — E039 Hypothyroidism, unspecified: Secondary | ICD-10-CM | POA: Diagnosis not present

## 2016-11-08 DIAGNOSIS — N320 Bladder-neck obstruction: Secondary | ICD-10-CM

## 2016-11-08 DIAGNOSIS — I2510 Atherosclerotic heart disease of native coronary artery without angina pectoris: Secondary | ICD-10-CM | POA: Diagnosis not present

## 2016-11-08 DIAGNOSIS — J4489 Other specified chronic obstructive pulmonary disease: Secondary | ICD-10-CM

## 2016-11-08 DIAGNOSIS — R7303 Prediabetes: Secondary | ICD-10-CM

## 2016-11-08 LAB — BASIC METABOLIC PANEL WITH GFR
BUN: 19 mg/dL (ref 7–25)
CO2: 27 mmol/L (ref 20–31)
Calcium: 9.3 mg/dL (ref 8.6–10.3)
Chloride: 103 mmol/L (ref 98–110)
Creat: 1.35 mg/dL — ABNORMAL HIGH (ref 0.70–1.25)
GFR, Est African American: 62 mL/min (ref >=60)
GFR, Est Non African American: 54 mL/min — ABNORMAL LOW (ref >=60)
Glucose, Bld: 87 mg/dL (ref 65–99)
Potassium: 4.1 mmol/L (ref 3.5–5.3)
Sodium: 140 mmol/L (ref 135–146)

## 2016-11-08 LAB — CBC WITH DIFFERENTIAL/PLATELET
Basophils Absolute: 72 cells/uL (ref 0–200)
Basophils Relative: 1 %
Eosinophils Absolute: 288 cells/uL (ref 15–500)
Eosinophils Relative: 4 %
HCT: 43.7 % (ref 38.5–50.0)
Hemoglobin: 14.3 g/dL (ref 13.2–17.1)
Lymphocytes Relative: 29 %
Lymphs Abs: 2088 cells/uL (ref 850–3900)
MCH: 31.2 pg (ref 27.0–33.0)
MCHC: 32.7 g/dL (ref 32.0–36.0)
MCV: 95.4 fL (ref 80.0–100.0)
MPV: 9.1 fL (ref 7.5–12.5)
Monocytes Absolute: 1008 cells/uL — ABNORMAL HIGH (ref 200–950)
Monocytes Relative: 14 %
Neutro Abs: 3744 cells/uL (ref 1500–7800)
Neutrophils Relative %: 52 %
Platelets: 247 10*3/uL (ref 140–400)
RBC: 4.58 MIL/uL (ref 4.20–5.80)
RDW: 13.4 % (ref 11.0–15.0)
WBC: 7.2 10*3/uL (ref 3.8–10.8)

## 2016-11-08 LAB — TSH: TSH: 4.63 mIU/L — ABNORMAL HIGH (ref 0.40–4.50)

## 2016-11-08 NOTE — Progress Notes (Signed)
MEDICARE ANNUAL WELLNESS VISIT AND FOLLOW UP Assessment:   1. Atherosclerosis of native coronary artery of native heart without angina pectoris -DASH diet -BP control -recommended statin for LDL goal reduction which patient declines  2. Labile hypertension -well controlled currently -dash diet -monitor at home -exercise as tolerated - TSH  3. 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 Malignant neoplasm of lung, unspecified laterality, unspecified part of lung ([place]) -resolved s/p radiation -followed by oncology  7. Hypothyroidism, unspecified type -TSH -cont levothyroxine -dose adjust if necessary   8. BPH/Prostatism -offered to use doxazosin -cont finasteride -pateint declined further medication  9. History of bilateral inguinal hernia repair -resolved after surgery  10. Hyperlipidemia, diet controlled -recommended statin therapy -patient refused  11. Prediabetes -cont diet and exercise  12. Vitamin D deficiency -cont Vit D supplement  13.  Medicare wellness visit -due next year    Over 30 minutes of exam, counseling, chart review, and critical decision making was performed  Future Appointments Date Time Provider [place]  03/23/2017 3:00 PM Unk Pinto, MD [facility] None     Plan:   During the course of the visit the patient was educated and counseled about appropriate screening and preventive services including:    Pneumococcal vaccine   Influenza vaccine  Prevnar 13  Td vaccine  Screening electrocardiogram  Colorectal cancer screening  Diabetes screening  Glaucoma screening  Nutrition counseling    Subjective:  Christopher Peck is a 71 y.o. male who presents for Medicare Annual Wellness Visit and 3 month follow up for HTN, hyperlipidemia, prediabetes, and vitamin D Def.   His blood pressure has been controlled at home, today their BP is BP: 106/64 He does not workout. He denies chest pain, shortness of breath, dizziness. He reports that he has been doing yard work and has been trying to walk around as much as he can.     He is not on cholesterol medication and denies myalgias. His cholesterol is at goal. The cholesterol last visit was:   Lab Results  Component Value Date   CHOL 189 08/09/2016   HDL 53 08/09/2016   LDLCALC 107 (H) 08/09/2016   TRIG 144 08/09/2016   CHOLHDL 3.6 08/09/2016    Last GFR Lab Results  Component Value Date   GFRNONAA 54 (L) 11/08/2016     Lab Results  Component Value Date   GFRAA 62 11/08/2016   Patient is on Vitamin D supplement.   Lab Results  Component Value Date   VD25OH 32 02/08/2016     He reports that he was seen here in the office in December and was taken off of breo due to chest pain.  He rpeorts that since coming off of it he is feeling much better.  He is just using albuterol.  He has been having normal breathing.    He reports that he has been having some trouble with urinary frequency and this has been troublesome.  He reports that he has been able to go back to sleep okay. He is not really interested in taking any more oral medications.     Medication Review: Current Outpatient Prescriptions on File Prior to Visit  Medication Sig Dispense Refill  . cholecalciferol (VITAMIN D)  1000 UNITS tablet Take 1,000 Units by mouth daily.    . citalopram (CELEXA) 20 MG tablet TAKE 1 TABLET BY MOUTH ONCE DAILY FOR MOOD 90 tablet 3  . finasteride (PROSCAR) 5 MG tablet TAKE 1 TABLET BY MOUTH EVERY DAY 90 tablet 1  . levothyroxine (SYNTHROID, LEVOTHROID) 125 MCG tablet TAKE 1 TABLET BY MOUTH EVERY DAY 90 tablet 2  . Magnesium 500 MG TABS Take by mouth. daily    .  PROAIR HFA 108 (90 Base) MCG/ACT inhaler INHALE 2 PUFFS BY MOUTH EVERY 4 HOURS AS NEEDED 8.5 Inhaler 1  . traMADol (ULTRAM) 50 MG tablet TAKE 1 TABLET BY MOUTH 3 TIMES A DAY AS NEEDED 90 tablet 1   No current facility-administered medications on file prior to visit.     Allergies: Allergies  Allergen Reactions  . Breo Ellipta [Fluticasone Furoate-Vilanterol] Other (See Comments)    Had chest pain  . Brovana [Arformoterol] Other (See Comments)    Interferes with thyroid medication   . Spiriva [Tiotropium Bromide Monohydrate]     Current Problems (verified) has CORONARY ATHEROSCLEROSIS NATIVE CORONARY ARTERY; History of bilateral inguinal hernia repair; Radiation fibrosis of lung ([place]); Labile hypertension; Vitamin D deficiency; Small Cell Lung Cancer (2006); Hyperlipidemia, diet controlled; Medication management; Hypothyroidism; COPD; and BPH/Prostatism on his problem list.  Screening Tests Immunization History  Administered Date(s) Administered  . Influenza, High Dose Seasonal PF 07/21/2014  . Influenza-Unspecified 06/24/2015  . Pneumococcal Conjugate-13 06/24/2015  . Pneumococcal Polysaccharide-23 01/03/2012  . Td 01/03/2012    Preventative care: Last colonoscopy: refuses  Names of Other Physician/Practitioners you currently use: 1. [facility] here for primary care 2. Dr. Gershon Crane, eye doctor, last visit 2017 3. Does not see, dentist, last visit 2018 Patient Care Team: Unk Pinto, MD as PCP - General (Internal Medicine) Curt Bears, MD as Consulting Physician (Oncology) Rutherford Guys, MD as Consulting Physician (Ophthalmology)  Surgical: He  has no past surgical history on file. Family His family history includes COPD in his father; Kidney failure in his father. Social history  He reports that he quit smoking about 12 years ago. He has never used smokeless tobacco. He reports that he drinks about 1.5 oz of alcohol per week  . His drug history is not on file.  MEDICARE WELLNESS OBJECTIVES: Physical activity: Current Exercise Habits: The patient does not participate in regular exercise at present Cardiac risk factors: Cardiac Risk Factors include: advanced age (>20mn, >>58women);male gender;hypertension;sedentary lifestyle;smoking/ tobacco exposure;dyslipidemia Depression/mood screen:   [facility] 11/08/2016  Decreased Interest 0  Down, Depressed, Hopeless 0  PHQ - 2 Score 0    ADLs:  In your present state of health, do you have any difficulty performing the following activities: 11/08/2016 02/08/2016  Hearing? N N  Vision? N N  Difficulty concentrating or making decisions? N N  Walking or climbing stairs? N -  Dressing or bathing? N N  Doing errands, shopping? N -  Preparing Food and eating ? N -  Using the Toilet? N -  In the past six months, have you accidently leaked urine? N -  Do you have problems with loss of bowel control? N -  Managing your Medications? N -  Managing your Finances? N -  Housekeeping or managing your Housekeeping? N -  Some recent data might be hidden     Cognitive Testing  Alert? Yes  Normal Appearance?Yes  Oriented to person? Yes  Place? Yes   Time? Yes  Recall of three objects?  Yes  Can perform simple calculations? Yes  Displays appropriate judgment?Yes  Can read the correct time from a watch face?Yes  EOL planning: Does Patient Have a Medical Advance Directive?: Yes Type of Advance Directive: Healthcare Power of Attorney, Living will Does patient want to make changes to medical advance directive?: Yes (MAU/Ambulatory/Procedural Areas - Information given) Copy of [place] in Chart?: Yes   Objective:   Today's Vitals   11/08/16 1428  BP: 106/64  Pulse: (!) 58  Resp: 16  Temp: 98 F (36.7 C)  TempSrc: Temporal  Weight: 210 lb (95.3 kg)  Height: 5' 11.5" (1.816 m)   Body mass index is 28.88 kg/m.  General appearance: alert,  no distress, WD/WN, male HEENT: normocephalic, sclerae anicteric, TMs pearly, nares patent, no discharge or erythema, pharynx normal Oral cavity: MMM, no lesions Neck: supple, no lymphadenopathy, no thyromegaly, no masses Heart: RRR, normal S1, S2, no murmurs Lungs: CTA bilaterally, no wheezes, rhonchi, or rales Abdomen: +bs, soft, non tender, non distended, no masses, no hepatomegaly, no splenomegaly Musculoskeletal: nontender, no swelling, no obvious deformity Extremities: no edema, no cyanosis, no clubbing Pulses: 2+ symmetric, upper and lower extremities, normal cap refill Neurological: alert, oriented x 3, CN2-12 intact, strength normal upper extremities and lower extremities, sensation normal throughout, DTRs 2+ throughout, no cerebellar signs, gait normal Psychiatric: normal affect, behavior normal, pleasant   Medicare Attestation I have personally reviewed: The patient's medical and social history Their use of alcohol, tobacco or illicit drugs Their current medications and supplements The patient's functional ability including ADLs,fall risks, home safety risks, cognitive, and hearing and visual impairment Diet and physical activities Evidence for depression or mood disorders  The patient's weight, height, BMI, and visual acuity have been recorded in the chart.  I have made referrals, counseling, and provided education to the patient based on review of the above and I have provided the patient with a written personalized care plan for preventive services.     Starlyn Skeans, PA-C   11/11/2016

## 2016-11-09 ENCOUNTER — Encounter: Payer: Self-pay | Admitting: Internal Medicine

## 2016-11-09 ENCOUNTER — Other Ambulatory Visit: Payer: Self-pay | Admitting: Internal Medicine

## 2016-11-20 ENCOUNTER — Other Ambulatory Visit: Payer: Self-pay | Admitting: Internal Medicine

## 2016-11-21 DIAGNOSIS — J449 Chronic obstructive pulmonary disease, unspecified: Secondary | ICD-10-CM | POA: Diagnosis not present

## 2016-11-23 ENCOUNTER — Other Ambulatory Visit: Payer: Self-pay | Admitting: Internal Medicine

## 2016-11-23 MED ORDER — TRAMADOL HCL 50 MG PO TABS: ORAL_TABLET | ORAL | 1 refills | Status: DC

## 2016-11-23 NOTE — Telephone Encounter (Signed)
Please call Tramadol

## 2016-12-30 ENCOUNTER — Other Ambulatory Visit: Payer: Self-pay | Admitting: Internal Medicine

## 2017-01-04 ENCOUNTER — Other Ambulatory Visit: Payer: Self-pay

## 2017-01-04 MED ORDER — ALBUTEROL SULFATE HFA 108 (90 BASE) MCG/ACT IN AERS: 2.0000 | INHALATION_SPRAY | RESPIRATORY_TRACT | 1 refills | Status: DC | PRN

## 2017-01-13 DIAGNOSIS — J449 Chronic obstructive pulmonary disease, unspecified: Secondary | ICD-10-CM | POA: Diagnosis not present

## 2017-02-15 ENCOUNTER — Other Ambulatory Visit: Payer: Self-pay | Admitting: Internal Medicine

## 2017-02-15 NOTE — Telephone Encounter (Signed)
Please call Tramadol

## 2017-02-17 ENCOUNTER — Other Ambulatory Visit: Payer: Self-pay | Admitting: Internal Medicine

## 2017-02-20 DIAGNOSIS — J449 Chronic obstructive pulmonary disease, unspecified: Secondary | ICD-10-CM | POA: Diagnosis not present

## 2017-03-06 ENCOUNTER — Other Ambulatory Visit: Payer: Self-pay | Admitting: Internal Medicine

## 2017-03-06 NOTE — Telephone Encounter (Signed)
Please call Tramadol

## 2017-03-14 DIAGNOSIS — H524 Presbyopia: Secondary | ICD-10-CM | POA: Diagnosis not present

## 2017-03-14 DIAGNOSIS — H5203 Hypermetropia, bilateral: Secondary | ICD-10-CM | POA: Diagnosis not present

## 2017-03-14 DIAGNOSIS — H2513 Age-related nuclear cataract, bilateral: Secondary | ICD-10-CM | POA: Diagnosis not present

## 2017-03-14 DIAGNOSIS — H52203 Unspecified astigmatism, bilateral: Secondary | ICD-10-CM | POA: Diagnosis not present

## 2017-03-22 ENCOUNTER — Other Ambulatory Visit: Payer: Self-pay | Admitting: Internal Medicine

## 2017-03-22 ENCOUNTER — Other Ambulatory Visit: Payer: Self-pay | Admitting: Physician Assistant

## 2017-03-23 ENCOUNTER — Ambulatory Visit (INDEPENDENT_AMBULATORY_CARE_PROVIDER_SITE_OTHER): Payer: Medicare Other | Admitting: Internal Medicine

## 2017-03-23 ENCOUNTER — Encounter: Payer: Self-pay | Admitting: Internal Medicine

## 2017-03-23 VITALS — BP 126/68 | HR 56 | Temp 97.7°F | Resp 16 | Ht 71.75 in | Wt 209.0 lb

## 2017-03-23 DIAGNOSIS — R0989 Other specified symptoms and signs involving the circulatory and respiratory systems: Secondary | ICD-10-CM

## 2017-03-23 DIAGNOSIS — E559 Vitamin D deficiency, unspecified: Secondary | ICD-10-CM | POA: Diagnosis not present

## 2017-03-23 DIAGNOSIS — E039 Hypothyroidism, unspecified: Secondary | ICD-10-CM | POA: Diagnosis not present

## 2017-03-23 DIAGNOSIS — R7303 Prediabetes: Secondary | ICD-10-CM

## 2017-03-23 DIAGNOSIS — Z125 Encounter for screening for malignant neoplasm of prostate: Secondary | ICD-10-CM | POA: Diagnosis not present

## 2017-03-23 DIAGNOSIS — E782 Mixed hyperlipidemia: Secondary | ICD-10-CM

## 2017-03-23 DIAGNOSIS — I10 Essential (primary) hypertension: Secondary | ICD-10-CM

## 2017-03-23 DIAGNOSIS — Z79899 Other long term (current) drug therapy: Secondary | ICD-10-CM

## 2017-03-23 DIAGNOSIS — Z85118 Personal history of other malignant neoplasm of bronchus and lung: Secondary | ICD-10-CM

## 2017-03-23 DIAGNOSIS — Z0000 Encounter for general adult medical examination without abnormal findings: Secondary | ICD-10-CM | POA: Diagnosis not present

## 2017-03-23 DIAGNOSIS — Z0001 Encounter for general adult medical examination with abnormal findings: Secondary | ICD-10-CM

## 2017-03-23 DIAGNOSIS — J701 Chronic and other pulmonary manifestations due to radiation: Secondary | ICD-10-CM

## 2017-03-23 DIAGNOSIS — Z136 Encounter for screening for cardiovascular disorders: Secondary | ICD-10-CM | POA: Diagnosis not present

## 2017-03-23 DIAGNOSIS — G894 Chronic pain syndrome: Secondary | ICD-10-CM

## 2017-03-23 DIAGNOSIS — Z1212 Encounter for screening for malignant neoplasm of rectum: Secondary | ICD-10-CM

## 2017-03-23 LAB — CBC WITH DIFFERENTIAL/PLATELET
Basophils Absolute: 84 cells/uL (ref 0–200)
Basophils Relative: 1 %
Eosinophils Absolute: 168 cells/uL (ref 15–500)
Eosinophils Relative: 2 %
HCT: 43.5 % (ref 38.5–50.0)
Hemoglobin: 14.3 g/dL (ref 13.2–17.1)
Lymphocytes Relative: 26 %
Lymphs Abs: 2184 cells/uL (ref 850–3900)
MCH: 31.2 pg (ref 27.0–33.0)
MCHC: 32.9 g/dL (ref 32.0–36.0)
MCV: 94.8 fL (ref 80.0–100.0)
MPV: 9.2 fL (ref 7.5–12.5)
Monocytes Absolute: 924 cells/uL (ref 200–950)
Monocytes Relative: 11 %
Neutro Abs: 5040 cells/uL (ref 1500–7800)
Neutrophils Relative %: 60 %
Platelets: 228 10*3/uL (ref 140–400)
RBC: 4.59 MIL/uL (ref 4.20–5.80)
RDW: 13.4 % (ref 11.0–15.0)
WBC: 8.4 10*3/uL (ref 3.8–10.8)

## 2017-03-23 LAB — TSH: TSH: 0.02 mIU/L — ABNORMAL LOW (ref 0.40–4.50)

## 2017-03-23 MED ORDER — GABAPENTIN 100 MG PO CAPS: ORAL_CAPSULE | ORAL | 2 refills | Status: DC

## 2017-03-23 NOTE — Progress Notes (Addendum)
[facility]   Unk Pinto, M.D.      Uvaldo Bristle. Silverio Lay, P.A.-C [facility]                [address] [place] [address] Telephone [phone] Telefax [phone] Annual  Screening/Preventative Visit  & Comprehensive Evaluation & Examination     This very nice 71 y.o.male presents for a Screening/Preventative Visit & comprehensive evaluation and management of multiple medical co-morbidities.  Patient has been followed for HTN, Prediabetes, Hyperlipidemia, Hypothyroidism and Vitamin D Deficiency.     Patient  has hx/o Small Cell Lung  Ca in 2006 and underwent curative chemoradiation with unfortunate complication of pulmonary fibrosis. He adamantly declines any further CXR's or Chest CT scans from hence forward disavowing to participate in an further treatments.      Patient is monitored expectantly with hx/o labile HTN since 2012.  Patient's BP has been controlled at home.  Today's BP is at goal - 126/68. Patient denies any cardiac symptoms as chest pain, palpitations, shortness of breath, dizziness or ankle swelling.     Patient's hyperlipidemia is controlled with diet and medications. Patient denies myalgias or other medication SE's. Last lipids were not at goal: Lab Results  Component Value Date   CHOL 189 08/09/2016   HDL 53 08/09/2016   LDLCALC 107 (H) 08/09/2016   TRIG 144 08/09/2016   CHOLHDL 3.6 08/09/2016      Patient  Is monitored expectantly for prediabetes and patient denies reactive hypoglycemic symptoms, visual blurring, diabetic polys or paresthesias. Last A1c was at goal: Lab Results  Component Value Date   HGBA1C 5.6 02/08/2016       Patient has been on Thyroid replacement predating since 2013. Finally, patient has history of Vitamin D Deficiency ("34" in 2008 and "23" in 2016) and last vitamin D was still not at goal: Lab Results  Component Value Date   VD25OH 32  02/08/2016   Current Outpatient Prescriptions on File Prior to Visit  Medication Sig  . albuterol (PROAIR HFA) 108 (90 Base) MCG/ACT inhaler Inhale 2 puffs into the lungs every 4 (four) hours as needed.  . cholecalciferol (VITAMIN D) 1000 UNITS tablet Take 1,000 Units by mouth daily.  . citalopram (CELEXA) 20 MG tablet TAKE 1 TABLET BY MOUTH ONCE DAILY FOR MOOD  . finasteride (PROSCAR) 5 MG tablet TAKE 1 TABLET BY MOUTH EVERY DAY  . levothyroxine (SYNTHROID, LEVOTHROID) 125 MCG tablet TAKE 1 TABLET BY MOUTH EVERY DAY  . Magnesium 500 MG TABS Take by mouth. daily  . traMADol (ULTRAM) 50 MG tablet TAKE 1 TABLET BY MOUTH 3 TIMES A DAY AS NEEDED FOR PAIN  . tiZANidine (ZANAFLEX) 4 MG tablet    No current facility-administered medications on file prior to visit.    No Active Allergies Past Medical History:  Diagnosis Date  . Cancer ([place])   . COPD (chronic obstructive pulmonary disease) ([place])   . Emphysema of lung ([place])   . Hyperlipidemia   . Hypertension   . Hypothyroid   . Other abnormal glucose    Health Maintenance  Topic Date Due  . Hepatitis C Screening  1946/01/18  . COLONOSCOPY  07/06/1998  . PNA vac Low Risk Adult (2 of 2 - PPSV23) 01/02/2017  . INFLUENZA VACCINE  04/05/2017  . TETANUS/TDAP  01/02/2022   Immunization History  Administered Date(s) Administered  .  Influenza, High Dose Seasonal PF 07/21/2014  . Influenza-Unspecified 06/24/2015  . Pneumococcal Conjugate-13 06/24/2015  . Pneumococcal Polysaccharide-23 01/03/2012  . Td 01/03/2012   No past surgical history on file. Family History  Problem Relation Age of Onset  . COPD Father   . Kidney failure Father    Social History   Social History  . Marital status: Married    Spouse name: N/A  . Number of children: N/A  . Years of education: N/A   Occupational History  . Not on file.   Social History Main Topics  . Smoking status: Former Smoker    Quit date: 09/05/2004  . Smokeless tobacco: Never Used  .  Alcohol use 1.5 oz/week    3 Standard drinks or equivalent per week  . Drug use: Unknown  . Sexual activity: Not on file   Other Topics Concern  . Not on file   Social History Narrative  . No narrative on file    ROS Constitutional: Denies fever, chills, weight loss/gain, headaches, insomnia,  night sweats or change in appetite. Does c/o fatigue. Eyes: Denies redness, blurred vision, diplopia, discharge, itchy or watery eyes.  ENT: Denies discharge, congestion, post nasal drip, epistaxis, sore throat, earache, hearing loss, dental pain, Tinnitus, Vertigo, Sinus pain or snoring.  Cardio: Denies chest pain, palpitations, irregular heartbeat, syncope, dyspnea, diaphoresis, orthopnea, PND, claudication or edema Respiratory: denies cough, dyspnea, DOE, pleurisy, hoarseness, laryngitis or wheezing.  Gastrointestinal: Denies dysphagia, heartburn, reflux, water brash, pain, cramps, nausea, vomiting, bloating, diarrhea, constipation, hematemesis, melena, hematochezia, jaundice or hemorrhoids Genitourinary: Denies dysuria, frequency, urgency, nocturia, hesitancy, discharge, hematuria or flank pain Musculoskeletal: Denies arthralgia, myalgia, stiffness, Jt. Swelling, pain, limp or strain/sprain. Denies Falls. Skin: Denies puritis, rash, hives, warts, acne, eczema or change in skin lesion Neuro: No weakness, tremor, incoordination, spasms, paresthesia or pain Psychiatric: Denies confusion, memory loss or sensory loss. Denies Depression. Endocrine: Denies change in weight, skin, hair change, nocturia, and paresthesia, diabetic polys, visual blurring or hyper / hypo glycemic episodes.  Heme/Lymph: No excessive bleeding, bruising or enlarged lymph nodes.  Physical Exam  BP 126/68   Pulse (!) 56   Temp 97.7 F (36.5 C)   Resp 16   Ht 5' 11.75" (1.822 m)   Wt 209 lb (94.8 kg)   BMI 28.54 kg/m   General Appearance: Well nourished and well groomed and in no apparent distress.  Eyes: PERRLA,  EOMs, conjunctiva no swelling or erythema, normal fundi and vessels. Sinuses: No frontal/maxillary tenderness ENT/Mouth: EACs patent / TMs  nl. Nares clear without erythema, swelling, mucoid exudates. Oral hygiene is good. No erythema, swelling, or exudate. Tongue normal, non-obstructing. Tonsils not swollen or erythematous. Hearing normal.  Neck: Supple, thyroid normal. No bruits, nodes or JVD. Respiratory: Respiratory effort normal.  BS equal and clear bilateral without rales, rhonci, wheezing or stridor. Cardio: Heart sounds are normal with regular rate and rhythm and no murmurs, rubs or gallops. Peripheral pulses are normal and equal bilaterally without edema. No aortic or femoral bruits. Chest: symmetric with normal excursions and percussion.  Abdomen: Soft, with Nl bowel sounds. Nontender, no guarding, rebound, hernias, masses, or organomegaly.  Lymphatics: Non tender without lymphadenopathy.  Genitourinary: No hernias.Testes nl. DRE - prostate nl for age - smooth & firm w/o nodules. Musculoskeletal: Full ROM all peripheral extremities, joint stability, 5/5 strength, and normal gait. Skin: Warm and dry without rashes, lesions, cyanosis, clubbing or  ecchymosis.  Neuro: Cranial nerves intact, reflexes equal bilaterally. Normal muscle tone, no cerebellar  symptoms. Sensation intact.  Pysch: Alert and oriented x 3 with normal affect, insight and judgment appropriate.   Assessment and Plan  1. Annual Preventative/Screening Exam   2. Labile hypertension  - EKG 12-Lead - [place], RETROPERITNL ABD,  LTD - Urinalysis, Routine w reflex microscopic - Microalbumin / creatinine urine ratio - CBC with Differential/Platelet - BASIC METABOLIC PANEL WITH GFR - Magnesium - TSH  3. Hyperlipidemia, mixed  - EKG 12-Lead - [place], RETROPERITNL ABD,  LTD - Hepatic function panel - Lipid panel  4. Prediabetes  - EKG 12-Lead - [place], RETROPERITNL ABD,  LTD - Hemoglobin A1c - Insulin, random  5. 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 Hx of cancer of lung   8. Hypothyroidism, unspecified type  - TSH  9. Screening for rectal cancer  - POC Hemoccult Bld/Stl   10. Prostate cancer screening  - PSA  11. Screening for ischemic heart disease  - EKG 12-Lead  12. Screening for AAA (aortic abdominal aneurysm)  - [place], RETROPERITNL ABD,  LTD  13. Medication management  - Urinalysis, Routine w reflex microscopic - Microalbumin / creatinine urine ratio - CBC with Differential/Platelet - BASIC METABOLIC PANEL WITH GFR - Hepatic function panel - Magnesium - Lipid panel - TSH - Hemoglobin A1c - Insulin, random - VITAMIN D 25 Hydroxyl  14. Chronic pain syndrome        Patient was counseled in prudent diet, weight control to achieve/maintain BMI less than 25, BP monitoring, regular exercise and medications as discussed.  Discussed med effects and SE's. Routine screening labs and tests as requested with regular follow-up as recommended. Over 40 minutes of exam, counseling, chart review and high complex critical decision making was performed

## 2017-03-23 NOTE — Telephone Encounter (Signed)
Please call tramadol

## 2017-03-23 NOTE — Patient Instructions (Signed)

## 2017-03-24 LAB — HEMOGLOBIN A1C
Hgb A1c MFr Bld: 5.3 % (ref <5.7)
Mean Plasma Glucose: 105 mg/dL

## 2017-03-24 LAB — BASIC METABOLIC PANEL WITH GFR
BUN: 19 mg/dL (ref 7–25)
CO2: 18 mmol/L — ABNORMAL LOW (ref 20–31)
Calcium: 8.9 mg/dL (ref 8.6–10.3)
Chloride: 110 mmol/L (ref 98–110)
Creat: 1.17 mg/dL (ref 0.70–1.25)
GFR, Est African American: 74 mL/min (ref >=60)
GFR, Est Non African American: 64 mL/min (ref >=60)
Glucose, Bld: 109 mg/dL — ABNORMAL HIGH (ref 65–99)
Potassium: 4 mmol/L (ref 3.5–5.3)
Sodium: 143 mmol/L (ref 135–146)

## 2017-03-24 LAB — URINALYSIS, ROUTINE W REFLEX MICROSCOPIC
Bilirubin Urine: NEGATIVE
Glucose, UA: NEGATIVE
Hgb urine dipstick: NEGATIVE
Ketones, ur: NEGATIVE
Leukocytes, UA: NEGATIVE
Nitrite: NEGATIVE
Protein, ur: NEGATIVE
Specific Gravity, Urine: 1.019 (ref 1.001–1.035)
pH: 5.5 (ref 5.0–8.0)

## 2017-03-24 LAB — HEPATIC FUNCTION PANEL
ALT: 14 U/L (ref 9–46)
AST: 21 U/L (ref 10–35)
Albumin: 3.6 g/dL (ref 3.6–5.1)
Alkaline Phosphatase: 71 U/L (ref 40–115)
Bilirubin, Direct: 0.1 mg/dL (ref <=0.2)
Indirect Bilirubin: 0.2 mg/dL (ref 0.2–1.2)
Total Bilirubin: 0.3 mg/dL (ref 0.2–1.2)
Total Protein: 6.2 g/dL (ref 6.1–8.1)

## 2017-03-24 LAB — LIPID PANEL
Cholesterol: 180 mg/dL (ref <200)
HDL: 51 mg/dL (ref >40)
LDL Cholesterol: 104 mg/dL — ABNORMAL HIGH (ref <100)
Total CHOL/HDL Ratio: 3.5 Ratio (ref <5.0)
Triglycerides: 126 mg/dL (ref <150)
VLDL: 25 mg/dL (ref <30)

## 2017-03-24 LAB — PSA: PSA: 0.4 ng/mL (ref <=4.0)

## 2017-03-24 LAB — INSULIN, RANDOM: Insulin: 20.9 u[IU]/mL — ABNORMAL HIGH (ref 2.0–19.6)

## 2017-03-24 LAB — MICROALBUMIN / CREATININE URINE RATIO
Creatinine, Urine: 140 mg/dL (ref 20–370)
Microalb Creat Ratio: 34 mcg/mg creat — ABNORMAL HIGH (ref <30)
Microalb, Ur: 4.7 mg/dL

## 2017-03-24 LAB — VITAMIN D 25 HYDROXY (VIT D DEFICIENCY, FRACTURES): Vit D, 25-Hydroxy: 33 ng/mL (ref 30–100)

## 2017-03-24 LAB — MAGNESIUM: Magnesium: 2 mg/dL (ref 1.5–2.5)

## 2017-03-25 ENCOUNTER — Other Ambulatory Visit: Payer: Self-pay | Admitting: Internal Medicine

## 2017-03-25 DIAGNOSIS — E039 Hypothyroidism, unspecified: Secondary | ICD-10-CM

## 2017-04-25 ENCOUNTER — Ambulatory Visit (INDEPENDENT_AMBULATORY_CARE_PROVIDER_SITE_OTHER): Payer: Medicare Other

## 2017-04-25 ENCOUNTER — Other Ambulatory Visit: Payer: Self-pay | Admitting: Internal Medicine

## 2017-04-25 DIAGNOSIS — E039 Hypothyroidism, unspecified: Secondary | ICD-10-CM | POA: Diagnosis not present

## 2017-04-25 DIAGNOSIS — G894 Chronic pain syndrome: Secondary | ICD-10-CM

## 2017-04-25 MED ORDER — GABAPENTIN 300 MG PO CAPS: ORAL_CAPSULE | ORAL | 1 refills | Status: DC

## 2017-04-25 NOTE — Progress Notes (Signed)
Pt presents for lab blood work for TSH. Pt takes his LEVOTHYROXINE as follows on M,W,F takes 1 whole tablet & on the other days pt takes 1/2 half tablet. Pt also would like an increase in his gabapentin 100mg . Pt reports that 100 mgs is not strong enough.

## 2017-04-26 LAB — TSH: TSH: 0.57 mIU/L (ref 0.40–4.50)

## 2017-06-14 ENCOUNTER — Other Ambulatory Visit: Payer: Self-pay | Admitting: Physician Assistant

## 2017-06-28 ENCOUNTER — Other Ambulatory Visit: Payer: Self-pay | Admitting: Internal Medicine

## 2017-07-12 DIAGNOSIS — J449 Chronic obstructive pulmonary disease, unspecified: Secondary | ICD-10-CM | POA: Diagnosis not present

## 2017-08-22 DIAGNOSIS — J449 Chronic obstructive pulmonary disease, unspecified: Secondary | ICD-10-CM | POA: Diagnosis not present

## 2017-08-23 ENCOUNTER — Encounter: Payer: Self-pay | Admitting: Physician Assistant

## 2017-08-23 ENCOUNTER — Ambulatory Visit (INDEPENDENT_AMBULATORY_CARE_PROVIDER_SITE_OTHER): Payer: Medicare Other | Admitting: Physician Assistant

## 2017-08-23 VITALS — BP 124/80 | HR 56 | Temp 97.5°F | Resp 14 | Ht 71.75 in | Wt 220.6 lb

## 2017-08-23 DIAGNOSIS — J701 Chronic and other pulmonary manifestations due to radiation: Secondary | ICD-10-CM | POA: Diagnosis not present

## 2017-08-23 DIAGNOSIS — J449 Chronic obstructive pulmonary disease, unspecified: Secondary | ICD-10-CM | POA: Diagnosis not present

## 2017-08-23 DIAGNOSIS — Z6830 Body mass index (BMI) 30.0-30.9, adult: Secondary | ICD-10-CM

## 2017-08-23 DIAGNOSIS — R0989 Other specified symptoms and signs involving the circulatory and respiratory systems: Secondary | ICD-10-CM | POA: Diagnosis not present

## 2017-08-23 DIAGNOSIS — E782 Mixed hyperlipidemia: Secondary | ICD-10-CM

## 2017-08-23 DIAGNOSIS — F3341 Major depressive disorder, recurrent, in partial remission: Secondary | ICD-10-CM | POA: Diagnosis not present

## 2017-08-23 DIAGNOSIS — E039 Hypothyroidism, unspecified: Secondary | ICD-10-CM

## 2017-08-23 DIAGNOSIS — Z79899 Other long term (current) drug therapy: Secondary | ICD-10-CM

## 2017-08-23 DIAGNOSIS — Z23 Encounter for immunization: Secondary | ICD-10-CM | POA: Diagnosis not present

## 2017-08-23 LAB — LIPID PANEL
Cholesterol: 202 mg/dL — ABNORMAL HIGH (ref <200)
HDL: 55 mg/dL (ref >40)
LDL Cholesterol (Calc): 119 mg/dL (calc) — ABNORMAL HIGH
Non-HDL Cholesterol (Calc): 147 mg/dL (calc) — ABNORMAL HIGH (ref <130)
Total CHOL/HDL Ratio: 3.7 (calc) (ref <5.0)
Triglycerides: 164 mg/dL — ABNORMAL HIGH (ref <150)

## 2017-08-23 LAB — HEPATIC FUNCTION PANEL
AG Ratio: 1.6 (calc) (ref 1.0–2.5)
ALT: 16 U/L (ref 9–46)
AST: 26 U/L (ref 10–35)
Albumin: 3.9 g/dL (ref 3.6–5.1)
Alkaline phosphatase (APISO): 67 U/L (ref 40–115)
Bilirubin, Direct: 0.1 mg/dL (ref 0.0–0.2)
Globulin: 2.4 g/dL (calc) (ref 1.9–3.7)
Indirect Bilirubin: 0.4 mg/dL (calc) (ref 0.2–1.2)
Total Bilirubin: 0.5 mg/dL (ref 0.2–1.2)
Total Protein: 6.3 g/dL (ref 6.1–8.1)

## 2017-08-23 LAB — CBC WITH DIFFERENTIAL/PLATELET
Basophils Absolute: 71 cells/uL (ref 0–200)
Basophils Relative: 1 %
Eosinophils Absolute: 163 cells/uL (ref 15–500)
Eosinophils Relative: 2.3 %
HCT: 42.6 % (ref 38.5–50.0)
Hemoglobin: 14.4 g/dL (ref 13.2–17.1)
Lymphs Abs: 1825 cells/uL (ref 850–3900)
MCH: 32.1 pg (ref 27.0–33.0)
MCHC: 33.8 g/dL (ref 32.0–36.0)
MCV: 94.9 fL (ref 80.0–100.0)
MPV: 9.3 fL (ref 7.5–12.5)
Monocytes Relative: 12.4 %
Neutro Abs: 4161 cells/uL (ref 1500–7800)
Neutrophils Relative %: 58.6 %
Platelets: 237 10*3/uL (ref 140–400)
RBC: 4.49 10*6/uL (ref 4.20–5.80)
RDW: 12.2 % (ref 11.0–15.0)
Total Lymphocyte: 25.7 %
WBC mixed population: 880 cells/uL (ref 200–950)
WBC: 7.1 10*3/uL (ref 3.8–10.8)

## 2017-08-23 LAB — BASIC METABOLIC PANEL WITH GFR
BUN: 12 mg/dL (ref 7–25)
CO2: 28 mmol/L (ref 20–32)
Calcium: 9.2 mg/dL (ref 8.6–10.3)
Chloride: 105 mmol/L (ref 98–110)
Creat: 1.16 mg/dL (ref 0.70–1.25)
GFR, Est African American: 74 mL/min/{1.73_m2} (ref >=60)
GFR, Est Non African American: 64 mL/min/{1.73_m2} (ref >=60)
Glucose, Bld: 85 mg/dL (ref 65–99)
Potassium: 4.6 mmol/L (ref 3.5–5.3)
Sodium: 140 mmol/L (ref 135–146)

## 2017-08-23 LAB — MAGNESIUM: Magnesium: 2.2 mg/dL (ref 1.5–2.5)

## 2017-08-23 LAB — TSH: TSH: 1.9 mIU/L (ref 0.40–4.50)

## 2017-08-23 NOTE — Progress Notes (Signed)
Assessment and Plan:   Need for prophylactic vaccination against Streptococcus pneumoniae (pneumococcus) -     Cancel: Pneumococcal conjugate vaccine 13-valent IM -     Pneumococcal polysaccharide vaccine 23-valent greater than or equal to 69yo subcutaneous/IM  BMI 30.0-30.9,adult - follow up 3 months for progress monitoring - increase veggies, decrease carbs - long discussion about weight loss, diet, and exercise  Labile hypertension - continue medications, DASH diet, exercise and monitor at home. Call if greater than 130/80.  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 Can try respimed  Hypothyroidism, unspecified type Hypothyroidism-check TSH level, continue medications the same, reminded to take on an empty stomach 30-87mins before food.  -     TSH  Hyperlipidemia, diet controlled -continue medications, check lipids, decrease fatty foods, increase activity.  -     Lipid panel  Medication management -     Magnesium  Depression, major, recurrent, in partial remission (HCC) - continue medications, stress management techniques discussed, increase water, good sleep hygiene discussed, increase exercise, and increase veggies.   Continue diet and meds as discussed. Further disposition pending results of labs. Okay to wait until august, patient will come in sooner if any issues Future Appointments  Date Time Provider [place]  04/19/2018  3:00 PM Unk Pinto, MD [facility] None    HPI 71 y.o. male  presents for 3 month follow up with hypertension, hyperlipidemia, prediabetes and vitamin D.   His blood pressure has been controlled at home, today their BP is BP: 124/80.   He does not workout. He denies chest pain, shortness of  breath, dizziness.   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 He states breathing has been worse with the cold air. He uses nebulizer twice a day. Does not want to try an inhaler at this time, states he did well with spiriva and breo.  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  He is on celexa for depression which is in remission.   He is not on cholesterol medication and denies myalgias. His cholesterol is at goal. The cholesterol last visit was:   Lab Results  Component Value Date   CHOL 180 03/23/2017   HDL 51 03/23/2017   LDLCALC 104 (H) 03/23/2017   TRIG 126 03/23/2017   CHOLHDL 3.5 03/23/2017    He has been working on diet and exercise for prediabetes, and denies foot ulcerations, hyperglycemia, hypoglycemia , increased appetite, nausea, paresthesia of the feet, polydipsia, polyuria, visual disturbances, vomiting and weight loss. Last A1C in the office was:  Lab Results  Component Value Date   HGBA1C 5.3 03/23/2017   Patient is on Vitamin D supplement.  Lab Results  Component Value Date   VD25OH 33 03/23/2017     BMI is Body mass index is 30.13 kg/m., he is working on diet and exercise. Wt Readings from Last 3 Encounters:  08/23/17 220 lb 9.6 oz (100.1 kg)  03/23/17 209 lb (94.8 kg)  11/08/16 210 lb (95.3 kg)   He is on thyroid medication. His medication was not changed last visit.   Lab Results  Component Value Date   TSH 0.57 04/25/2017  .    Current Medications:  Current Outpatient Medications on File Prior to Visit  Medication Sig Dispense Refill  . albuterol (PROVENTIL HFA;VENTOLIN HFA) 108 (90 Base) MCG/ACT inhaler Inhale 2 puffs into the lungs every 4 hours as needed. 1 Inhaler 1  . cholecalciferol (VITAMIN D) 1000 UNITS tablet Take 1,000 Units by mouth daily.    . citalopram (CELEXA) 20 MG tablet TAKE 1 TABLET BY MOUTH ONCE DAILY FOR MOOD 90 tablet 1  .  finasteride (PROSCAR) 5 MG tablet TAKE 1 TABLET BY MOUTH EVERY DAY 90 tablet 1  . levothyroxine (SYNTHROID, LEVOTHROID) 125 MCG tablet TAKE 1 TABLET BY MOUTH EVERY DAY 90 tablet 2  . Magnesium 500 MG TABS Take by mouth. daily    . gabapentin (NEURONTIN) 300 MG capsule Take 1 capsule 3 x / day as needed for chronic pain 270 capsule 1   No current facility-administered medications on file prior to visit.     Medical History:  Past Medical History:  Diagnosis Date  . Cancer ([place])   . COPD (chronic obstructive pulmonary disease) ([place])   . Emphysema of lung ([place])   . History of bilateral inguinal hernia repair 05/11/2009  . Hyperlipidemia   . Hypertension   . Hypothyroid   . Other abnormal glucose     Allergies:  No Known Allergies   Review of Systems:  Review of Systems  Constitutional: Negative for chills, fever and malaise/fatigue.  HENT: Negative for congestion, ear pain and sore throat.   Eyes: Negative.   Respiratory: Negative for cough, shortness of breath and wheezing.   Cardiovascular: Negative for chest pain, palpitations and leg swelling.  Gastrointestinal: Negative for abdominal pain, blood in stool, constipation, diarrhea, heartburn and melena.  Genitourinary: Negative.   Skin: Negative.   Neurological: Negative for dizziness, sensory change, loss of consciousness and headaches.  Psychiatric/Behavioral: Negative for depression. The patient is not nervous/anxious and does not have insomnia.     Family history- Review and unchanged  Social history- Review and unchanged  Physical Exam: BP 124/80   Pulse (!) 56   Temp (!) 97.5 F (36.4 C)   Resp 14   Ht 5' 11.75" (1.822 m)   Wt 220 lb 9.6 oz (100.1 kg)   SpO2 97%   BMI 30.13 kg/m  Wt Readings from Last 3 Encounters:  08/23/17 220 lb 9.6 oz (100.1 kg)  03/23/17 209 lb (94.8 kg)  11/08/16 210 lb (95.3 kg)    General Appearance: Well nourished well developed, in no apparent distress. Eyes: PERRLA, EOMs,  conjunctiva no swelling or erythema ENT/Mouth: Ear canals normal without obstruction, swelling, erythma, discharge.  TMs normal bilaterally.  Oropharynx moist, clear, without exudate, or postoropharyngeal swelling. Neck: Supple, thyroid normal,no cervical adenopathy  Respiratory: Respiratory effort normal, Breath sounds clear A&P without rhonchi, wheeze, or rale.  No retractions, no accessory usage. Cardio: RRR with no MRGs. Brisk peripheral pulses without edema.  Abdomen: Soft, + BS,  Non tender, no guarding, rebound, hernias, masses. Musculoskeletal: Full ROM, 5/5 strength, Normal gait Skin: Warm, dry without rashes, lesions, ecchymosis.  Neuro: Awake and oriented X 3, Cranial nerves intact. Normal muscle tone, no cerebellar symptoms. Psych: Normal affect, Insight and Judgment appropriate.    Vicie Mutters, PA-C 10:31 AM [facility]

## 2017-09-10 ENCOUNTER — Other Ambulatory Visit: Payer: Self-pay | Admitting: Internal Medicine

## 2017-09-15 ENCOUNTER — Other Ambulatory Visit: Payer: Self-pay | Admitting: Internal Medicine

## 2017-10-03 ENCOUNTER — Other Ambulatory Visit: Payer: Self-pay | Admitting: Internal Medicine

## 2017-10-14 ENCOUNTER — Other Ambulatory Visit: Payer: Self-pay | Admitting: Internal Medicine

## 2017-10-14 DIAGNOSIS — G894 Chronic pain syndrome: Secondary | ICD-10-CM

## 2017-10-14 MED ORDER — GABAPENTIN 300 MG PO CAPS: ORAL_CAPSULE | ORAL | 1 refills | Status: DC

## 2017-11-09 DIAGNOSIS — J449 Chronic obstructive pulmonary disease, unspecified: Secondary | ICD-10-CM | POA: Diagnosis not present

## 2017-12-22 ENCOUNTER — Other Ambulatory Visit: Payer: Self-pay | Admitting: Internal Medicine

## 2018-03-12 ENCOUNTER — Other Ambulatory Visit: Payer: Self-pay | Admitting: Internal Medicine

## 2018-03-12 ENCOUNTER — Other Ambulatory Visit: Payer: Self-pay | Admitting: Physician Assistant

## 2018-03-15 ENCOUNTER — Other Ambulatory Visit: Payer: Self-pay | Admitting: Physician Assistant

## 2018-03-28 ENCOUNTER — Other Ambulatory Visit: Payer: Self-pay | Admitting: Internal Medicine

## 2018-04-19 ENCOUNTER — Ambulatory Visit (INDEPENDENT_AMBULATORY_CARE_PROVIDER_SITE_OTHER): Payer: Medicare Other | Admitting: Internal Medicine

## 2018-04-19 VITALS — BP 108/80 | HR 76 | Temp 97.6°F | Resp 18 | Ht 71.5 in | Wt 214.6 lb

## 2018-04-19 DIAGNOSIS — E559 Vitamin D deficiency, unspecified: Secondary | ICD-10-CM | POA: Diagnosis not present

## 2018-04-19 DIAGNOSIS — J449 Chronic obstructive pulmonary disease, unspecified: Secondary | ICD-10-CM

## 2018-04-19 DIAGNOSIS — Z0001 Encounter for general adult medical examination with abnormal findings: Secondary | ICD-10-CM

## 2018-04-19 DIAGNOSIS — R0989 Other specified symptoms and signs involving the circulatory and respiratory systems: Secondary | ICD-10-CM | POA: Diagnosis not present

## 2018-04-19 DIAGNOSIS — N138 Other obstructive and reflux uropathy: Secondary | ICD-10-CM

## 2018-04-19 DIAGNOSIS — N401 Enlarged prostate with lower urinary tract symptoms: Secondary | ICD-10-CM

## 2018-04-19 DIAGNOSIS — J701 Chronic and other pulmonary manifestations due to radiation: Secondary | ICD-10-CM

## 2018-04-19 DIAGNOSIS — Z0000 Encounter for general adult medical examination without abnormal findings: Secondary | ICD-10-CM

## 2018-04-19 DIAGNOSIS — Z125 Encounter for screening for malignant neoplasm of prostate: Secondary | ICD-10-CM

## 2018-04-19 DIAGNOSIS — Z136 Encounter for screening for cardiovascular disorders: Secondary | ICD-10-CM | POA: Diagnosis not present

## 2018-04-19 DIAGNOSIS — Z1211 Encounter for screening for malignant neoplasm of colon: Secondary | ICD-10-CM

## 2018-04-19 DIAGNOSIS — I2510 Atherosclerotic heart disease of native coronary artery without angina pectoris: Secondary | ICD-10-CM | POA: Diagnosis not present

## 2018-04-19 DIAGNOSIS — E782 Mixed hyperlipidemia: Secondary | ICD-10-CM | POA: Diagnosis not present

## 2018-04-19 DIAGNOSIS — E039 Hypothyroidism, unspecified: Secondary | ICD-10-CM

## 2018-04-19 DIAGNOSIS — Z79899 Other long term (current) drug therapy: Secondary | ICD-10-CM

## 2018-04-19 DIAGNOSIS — J4520 Mild intermittent asthma, uncomplicated: Secondary | ICD-10-CM

## 2018-04-19 DIAGNOSIS — R7309 Other abnormal glucose: Secondary | ICD-10-CM | POA: Insufficient documentation

## 2018-04-19 DIAGNOSIS — Z1212 Encounter for screening for malignant neoplasm of rectum: Secondary | ICD-10-CM

## 2018-04-19 MED ORDER — TRAZODONE HCL 150 MG PO TABS: ORAL_TABLET | ORAL | 1 refills | Status: DC

## 2018-04-19 MED ORDER — ALBUTEROL SULFATE 1.25 MG/3ML IN NEBU: 1.0000 | INHALATION_SOLUTION | Freq: Four times a day (QID) | RESPIRATORY_TRACT | 99 refills | Status: DC | PRN

## 2018-04-19 MED ORDER — ALBUTEROL SULFATE HFA 108 (90 BASE) MCG/ACT IN AERS: INHALATION_SPRAY | RESPIRATORY_TRACT | 3 refills | Status: DC

## 2018-04-19 NOTE — Progress Notes (Signed)
[facility]   Unk Pinto, M.D.     Uvaldo Bristle. Silverio Lay, P.A.-C [name], [place]                [address] [place] [address] Telephone [phone] Telefax [phone] Annual  Screening/Preventative Visit  & Comprehensive Evaluation & Examination     This very nice 72 y.o. MWM  presents for a Screening /Preventative Visit & comprehensive evaluation and management of multiple medical co-morbidities.  Patient has been followed for HTN, HLD, Prediabetes and Vitamin D Deficiency. He's started taking CBD oil daily with apparent good control of hid pain & insomnia.      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 He refuses to have any further CXR's or CT scans.      Labile HTN predates circa 2012. Patient's BP has been controlled at home.  Today's BP is at goal - 108/80. Patient reports hx/o pASfi 1st diagnosed iat age 63 yo and he has experienced it intermittently over the years and he refuses to take an meds (his CHADs2Vasc2 score is 2). Patient denies any cardiac symptoms as chest pain, palpitations, shortness of breath, dizziness or ankle swelling.     Patient's hyperlipidemia is not controlled with diet other medication SE's. Last lipids were not at goal: Lab Results  Component Value Date   CHOL 202 (H) 08/23/2017   HDL 55 08/23/2017   LDLCALC 119 (H) 08/23/2017   TRIG 164 (H) 08/23/2017   CHOLHDL 3.7 08/23/2017      Patient is followed expectantly for prediabetes and patient denies reactive hypoglycemic symptoms, visual blurring, diabetic polys or paresthesias. Last A1c was at goal; Lab Results  Component Value Date   HGBA1C 5.5 04/19/2018       Patient has been on thyroid replacement since 2013.     Finally, patient has history of Vitamin D Deficiency("34"/2008 &"23"/2016)  and last vitamin D was still  very low: Lab Results  Component Value Date   VD25OH 34 04/19/2018   Current Outpatient Medications on File Prior to Visit  Medication Sig  . cholecalciferol (VITAMIN D) 1000 UNITS tablet Take 1,000 Units by mouth daily.  . citalopram (CELEXA) 20 MG tablet TAKE 1 TABLET BY MOUTH ONCE DAILY FOR MOOD  . finasteride (PROSCAR) 5 MG tablet TAKE 1 TABLET BY MOUTH EVERY DAY  . levothyroxine (SYNTHROID, LEVOTHROID) 125 MCG tablet TAKE 1 TABLET BY MOUTH EVERY DAY  . Magnesium 500 MG TABS Take by mouth. daily   No current facility-administered medications on file prior to visit.    No Known Allergies   Past Medical History:  Diagnosis Date  . Cancer ([place])   . COPD (chronic obstructive pulmonary disease) ([place])   . Emphysema of lung ([place])   . History of bilateral inguinal hernia repair 05/11/2009  . Hyperlipidemia   . Hypertension   . Hypothyroid   . Other abnormal glucose    Health Maintenance  Topic Date Due  . Hepatitis C Screening  Sep 21, 1945  . INFLUENZA VACCINE  04/05/2018  . TETANUS/TDAP  01/02/2022  . PNA vac Low Risk Adult  Completed  . COLONOSCOPY  Discontinued   Immunization History  Administered Date(s) Administered  . Influenza, High Dose Seasonal PF 07/21/2014  . Influenza-Unspecified 06/24/2015, 06/07/2017  . Pneumococcal Conjugate-13 06/24/2015  .  Pneumococcal Polysaccharide-23 01/03/2012, 08/23/2017  . Td 01/03/2012   Last Colon - Refuses  History reviewed. No pertinent surgical history.   Family History  Problem Relation Age of Onset  . COPD Father   . Kidney failure Father    Social History   Socioeconomic History  . Marital status: Married    Spouse name: Raquel Sarna  . Number of children: 2 sons  Occupational History  . Retired Physiological scientist til disabled in 2006.   Tobacco Use  . Smoking status: Former Smoker    Last attempt to quit: 09/05/2004    Years since quitting: 13.6  . Smokeless tobacco: Never Used  Substance and Sexual Activity  . Alcohol  use: Yes    Alcohol/week: 3.0 standard drinks    Types: 3 Standard drinks or equivalent per week  . Drug use: No  . Sexual activity: Not on file    ROS Constitutional: Denies fever, chills, weight loss/gain, headaches, insomnia,  night sweats or change in appetite. Does c/o fatigue. Eyes: Denies redness, blurred vision, diplopia, discharge, itchy or watery eyes.  ENT: Denies discharge, congestion, post nasal drip, epistaxis, sore throat, earache, hearing loss, dental pain, Tinnitus, Vertigo, Sinus pain or snoring.  Cardio: Denies chest pain, palpitations, irregular heartbeat, syncope, dyspnea, diaphoresis, orthopnea, PND, claudication or edema Respiratory: denies cough, dyspnea, DOE, pleurisy, hoarseness, laryngitis or wheezing.  Gastrointestinal: Denies dysphagia, heartburn, reflux, water brash, pain, cramps, nausea, vomiting, bloating, diarrhea, constipation, hematemesis, melena, hematochezia, jaundice or hemorrhoids Genitourinary: Denies dysuria, frequency, urgency, nocturia, hesitancy, discharge, hematuria or flank pain Musculoskeletal: Denies arthralgia, myalgia, stiffness, Jt. Swelling, pain, limp or strain/sprain. Denies Falls. Skin: Denies puritis, rash, hives, warts, acne, eczema or change in skin lesion Neuro: No weakness, tremor, incoordination, spasms, paresthesia or pain Psychiatric: Denies confusion, memory loss or sensory loss. Denies Depression. Endocrine: Denies change in weight, skin, hair change, nocturia, and paresthesia, diabetic polys, visual blurring or hyper / hypo glycemic episodes.  Heme/Lymph: No excessive bleeding, bruising or enlarged lymph nodes.  Physical Exam  BP 108/80   Pulse 76   Temp 97.6 F (36.4 C)   Resp 18   Ht 5' 11.5" (1.816 m)   Wt 214 lb 9.6 oz (97.3 kg)   SpO2 98%   BMI 29.51 kg/m   General Appearance: Well nourished and well groomed and in no apparent distress.  Eyes: PERRLA, EOMs, conjunctiva no swelling or erythema, normal fundi and  vessels. Sinuses: No frontal/maxillary tenderness ENT/Mouth: EACs patent / TMs  nl. Nares clear without erythema, swelling, mucoid exudates. Oral hygiene is good. No erythema, swelling, or exudate. Tongue normal, non-obstructing. Tonsils not swollen or erythematous. Hearing normal.  Neck: Supple, thyroid not palpable. No bruits, nodes or JVD. Respiratory: Respiratory effort normal.  BS equal and clear bilateral without rales, rhonci, wheezing or stridor. Cardio: Heart sounds are normal with regular rate and rhythm and no murmurs, rubs or gallops. Peripheral pulses are normal and equal bilaterally without edema. No aortic or femoral bruits. Chest: symmetric with normal excursions and percussion.  Abdomen: Soft, with Nl bowel sounds. Nontender, no guarding, rebound, hernias, masses, or organomegaly.  Lymphatics: Non tender without lymphadenopathy.  Genitourinary: DRE - prostate nl for age - smooth & firm w/o nodules. Musculoskeletal: Full ROM all peripheral extremities, joint stability, 5/5 strength, and normal gait. Skin: Warm and dry without rashes, lesions, cyanosis, clubbing or  ecchymosis.  Neuro: Cranial nerves intact, reflexes equal bilaterally. Normal muscle tone, no cerebellar symptoms. Sensation intact.  Pysch: Alert and oriented X 3  with normal affect, insight and judgment appropriate.   Assessment and Plan  1. Annual Preventative/Screening Exam   2. Labile hypertension  - EKG 12-Lead - [place], RETROPERITNL ABD,  LTD - Urinalysis, Routine w reflex microscopic - Microalbumin / creatinine urine ratio - CBC with Differential/Platelet - COMPLETE METABOLIC PANEL WITH GFR - Magnesium  3. Hyperlipidemia, mixed  - EKG 12-Lead - [place], RETROPERITNL ABD,  LTD - TSH - Hemoglobin A1c - Insulin, random  4. Abnormal glucose  - EKG 12-Lead - [place], RETROPERITNL ABD,  LTD  5. Vitamin D deficiency  - VITAMIN D 25 Hydroxyl  6. Hypothyroidism  - TSH  7. Atherosclerosis of native  coronary artery of native heart without angina pectoris  - EKG 12-Lead  8. 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 COPD  10. 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 Prostate cancer screening  - PSA  13. Screening for ischemic heart disease  - EKG 12-Lead  14. Screening for AAA (aortic abdominal aneurysm)  - [place], RETROPERITNL ABD,  LTD  15. Medication management  - Urinalysis, Routine w reflex microscopic - Microalbumin / creatinine urine ratio - COMPLETE METABOLIC PANEL WITH GFR - Magnesium - TSH - Hemoglobin A1c - Insulin, random - VITAMIN D 25 Hydroxyl  16. Mild intermittent asthma without complication  - albuterol (PROAIR HFA) 108 (90 Base) MCG/ACT inhaler; Take 1 to 2 inhalations  every 4 hours as needed to rescue Asthma.  Dispense: 48 g; Refill: 3 - albuterol (ACCUNEB) 1.25 MG/3ML nebulizer solution; Take 3 mLs (1.25 mg total) by nebulization every 6 (six) hours as needed for wheezing.  Dispense: 75 mL; Refill: 99      Patient was counseled in prudent diet, weight control to achieve/maintain BMI less than 25, BP monitoring, regular exercise and medications as discussed.  Discussed med effects and SE's. Routine screening labs and tests as requested with regular follow-up as recommended. Over 40 minutes of exam, counseling, chart review and high complex critical decision making was performed

## 2018-04-19 NOTE — Patient Instructions (Signed)

## 2018-04-20 LAB — COMPLETE METABOLIC PANEL WITH GFR
AG Ratio: 1.5 (calc) (ref 1.0–2.5)
ALT: 15 U/L (ref 9–46)
AST: 24 U/L (ref 10–35)
Albumin: 4.1 g/dL (ref 3.6–5.1)
Alkaline phosphatase (APISO): 79 U/L (ref 40–115)
BUN/Creatinine Ratio: 12 (calc) (ref 6–22)
BUN: 16 mg/dL (ref 7–25)
CO2: 26 mmol/L (ref 20–32)
Calcium: 9.3 mg/dL (ref 8.6–10.3)
Chloride: 104 mmol/L (ref 98–110)
Creat: 1.32 mg/dL — ABNORMAL HIGH (ref 0.70–1.25)
GFR, Est African American: 63 mL/min/{1.73_m2} (ref >=60)
GFR, Est Non African American: 55 mL/min/{1.73_m2} — ABNORMAL LOW (ref >=60)
Globulin: 2.8 g/dL (calc) (ref 1.9–3.7)
Glucose, Bld: 107 mg/dL — ABNORMAL HIGH (ref 65–99)
Potassium: 4 mmol/L (ref 3.5–5.3)
Sodium: 140 mmol/L (ref 135–146)
Total Bilirubin: 0.4 mg/dL (ref 0.2–1.2)
Total Protein: 6.9 g/dL (ref 6.1–8.1)

## 2018-04-20 LAB — CBC WITH DIFFERENTIAL/PLATELET
Basophils Absolute: 74 cells/uL (ref 0–200)
Basophils Relative: 0.9 %
Eosinophils Absolute: 148 cells/uL (ref 15–500)
Eosinophils Relative: 1.8 %
HCT: 45.9 % (ref 38.5–50.0)
Hemoglobin: 15.5 g/dL (ref 13.2–17.1)
Lymphs Abs: 2247 cells/uL (ref 850–3900)
MCH: 32 pg (ref 27.0–33.0)
MCHC: 33.8 g/dL (ref 32.0–36.0)
MCV: 94.6 fL (ref 80.0–100.0)
MPV: 9.7 fL (ref 7.5–12.5)
Monocytes Relative: 11.8 %
Neutro Abs: 4764 cells/uL (ref 1500–7800)
Neutrophils Relative %: 58.1 %
Platelets: 254 10*3/uL (ref 140–400)
RBC: 4.85 10*6/uL (ref 4.20–5.80)
RDW: 12.5 % (ref 11.0–15.0)
Total Lymphocyte: 27.4 %
WBC mixed population: 968 cells/uL — ABNORMAL HIGH (ref 200–950)
WBC: 8.2 10*3/uL (ref 3.8–10.8)

## 2018-04-20 LAB — URINALYSIS, ROUTINE W REFLEX MICROSCOPIC
Bilirubin Urine: NEGATIVE
Glucose, UA: NEGATIVE
Hgb urine dipstick: NEGATIVE
Ketones, ur: NEGATIVE
Leukocytes, UA: NEGATIVE
Nitrite: NEGATIVE
Protein, ur: NEGATIVE
Specific Gravity, Urine: 1.016 (ref 1.001–1.03)
pH: 6 (ref 5.0–8.0)

## 2018-04-20 LAB — PSA: PSA: 0.4 ng/mL (ref <=4.0)

## 2018-04-20 LAB — MAGNESIUM: Magnesium: 2.1 mg/dL (ref 1.5–2.5)

## 2018-04-20 LAB — TSH: TSH: 2.89 mIU/L (ref 0.40–4.50)

## 2018-04-20 LAB — MICROALBUMIN / CREATININE URINE RATIO
Creatinine, Urine: 121 mg/dL (ref 20–320)
Microalb Creat Ratio: 21 mcg/mg creat (ref <30)
Microalb, Ur: 2.5 mg/dL

## 2018-04-20 LAB — VITAMIN D 25 HYDROXY (VIT D DEFICIENCY, FRACTURES): Vit D, 25-Hydroxy: 34 ng/mL (ref 30–100)

## 2018-04-20 LAB — HEMOGLOBIN A1C
Hgb A1c MFr Bld: 5.5 % of total Hgb (ref <5.7)
Mean Plasma Glucose: 111 (calc)
eAG (mmol/L): 6.2 (calc)

## 2018-04-20 LAB — INSULIN, RANDOM: Insulin: 7.8 u[IU]/mL (ref 2.0–19.6)

## 2018-04-21 ENCOUNTER — Other Ambulatory Visit: Payer: Self-pay | Admitting: Internal Medicine

## 2018-04-21 DIAGNOSIS — F5101 Primary insomnia: Secondary | ICD-10-CM

## 2018-04-21 MED ORDER — TRAZODONE HCL 150 MG PO TABS: ORAL_TABLET | ORAL | 3 refills | Status: DC

## 2018-04-22 ENCOUNTER — Encounter: Payer: Self-pay | Admitting: Internal Medicine

## 2018-06-27 ENCOUNTER — Other Ambulatory Visit: Payer: Self-pay | Admitting: Internal Medicine

## 2018-07-24 DIAGNOSIS — E663 Overweight: Secondary | ICD-10-CM | POA: Insufficient documentation

## 2018-07-24 NOTE — Progress Notes (Signed)
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 -pateint cannot tolerated inhaled corticosteroids they cause CP  Medication management - CBC with Differential/Platelet - CMP WITH GFR  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  Personal history of lung cancer -resolved s/p radiation -followed by oncology  Hypothyroidism, unspecified type -TSH -cont levothyroxine -dose adjust if necessary   BPH/Prostatism -cont finasteride -pateint declined further medication  Hyperlipidemia, diet controlled -recommended statin therapy -patient refused  Other abnormal glucose -cont diet and exercise  Vitamin D deficiency -cont Vit D supplement  Over 30 minutes of exam, counseling, chart review, and critical decision making was performed  Future Appointments  Date Time Provider [place]  10/31/2018 11:30 AM Vicie Mutters, PA-C [facility] None  05/23/2019  3:00 PM Unk Pinto, MD [facility] None     Plan:   During the course of the visit the patient was educated and counseled about appropriate screening and preventive services including:    Pneumococcal vaccine   Influenza vaccine  Prevnar 13  Td vaccine  Screening electrocardiogram  Colorectal cancer screening  Diabetes screening  Glaucoma screening  Nutrition counseling    Subjective:  Christopher Peck is a 72 y.o. male who presents for Medicare Annual Wellness Visit and 3 month follow up for HTN, hyperlipidemia, glucose management,  and vitamin D Def.  He has reportedly started taking CBD oil daily with apparent good control of hip pain & insomnia. 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 He refuses to have any further CXR's or CT scans.   BMI is Body mass index is 29.93 kg/m., he has not been working on diet and exercise, he is not interested in working on this.  Wt Readings from Last 3 Encounters:  07/25/18 217 lb 9.6 oz (98.7 kg)  04/19/18 214 lb 9.6 oz (97.3 kg)  08/23/17 220 lb 9.6 oz (100.1 kg)   His blood pressure has been controlled at home, today their BP is BP: 134/80 He does not workout. He denies chest pain, shortness of breath, dizziness. He reports that he has been doing yard work and has been trying to walk around as much as he can.    He is not on cholesterol medication and denies myalgias. His cholesterol is not at goal. The cholesterol last visit was:   Lab Results  Component Value Date   CHOL 202 (H) 08/23/2017   HDL 55 08/23/2017   LDLCALC 119 (H) 08/23/2017   TRIG 164 (H) 08/23/2017   CHOLHDL 3.7 08/23/2017    He has been working on diet and exercise for prediabetes, and denies increased appetite, nausea, paresthesia of the feet, polydipsia, polyuria, visual disturbances and vomiting. Last A1C in the office was:  Lab Results  Component Value Date   HGBA1C 5.5 04/19/2018   Last GFR Lab Results  Component Value Date   GFRNONAA 55 (L) 04/19/2018   Patient is not on Vitamin D supplement, declines to supplement, takes a multivitamin.  Lab Results  Component Value Date   VD25OH 34 04/19/2018       Medication  Review: Current Outpatient Medications on File Prior to Visit  Medication Sig Dispense Refill  . albuterol (ACCUNEB) 1.25 MG/3ML nebulizer solution Take 3 mLs (1.25 mg total) by nebulization every 6 (six) hours as needed for wheezing. 75 mL 99  . albuterol (PROAIR HFA) 108 (90 Base) MCG/ACT inhaler Take 1 to 2  inhalations  every 4 hours as needed to rescue Asthma. 48 g 3  . cholecalciferol (VITAMIN D) 1000 UNITS tablet Take 1,000 Units by mouth daily.    . citalopram (CELEXA) 20 MG tablet TAKE 1 TABLET BY MOUTH ONCE DAILY FOR MOOD 90 tablet 1  . finasteride (PROSCAR) 5 MG tablet TAKE 1 TABLET BY MOUTH EVERY DAY 90 tablet 1  . levothyroxine (SYNTHROID, LEVOTHROID) 125 MCG tablet TAKE 1 TABLET BY MOUTH EVERY DAY 90 tablet 0  . Magnesium 500 MG TABS Take by mouth. daily     No current facility-administered medications on file prior to visit.     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  Screening Tests Immunization History  Administered Date(s) Administered  . Influenza, High Dose Seasonal PF 07/21/2014, 06/19/2018  . Influenza-Unspecified 06/24/2015, 06/07/2017  . Pneumococcal Conjugate-13 06/24/2015  . Pneumococcal Polysaccharide-23 01/03/2012, 08/23/2017  . Td 01/03/2012    Preventative care: Last colonoscopy: never has had, refuses colonoscopy or cologuard, does hemoccult annually at CPE  Tetanus: 2013 Influenza: 2019 Pneumonia 2018 Prevnar 13: 2016 Shingles: declines  Names of Other Physician/Practitioners you currently use: 1. [facility] here for primary care 2. Dr. Gershon Crane, eye doctor, last visit 2018, will schedule, wears glasses 3. Does not see, dentist, last visit 2018, full dentures   Patient Care Team: Unk Pinto, MD as PCP - General (Internal Medicine) Curt Bears, MD as Consulting Physician (Oncology) Rutherford Guys, MD as Consulting Physician  (Ophthalmology)  Surgical: He  has no past surgical history on file. Family His family history includes COPD in his father; Kidney failure in his father. Social history  He reports that he quit smoking about 13 years ago. He has never used smokeless tobacco. He reports that he drinks about 3.0 standard drinks of alcohol per week. His drug history is not on file.  MEDICARE WELLNESS OBJECTIVES: Physical activity: Current Exercise Habits: Home exercise routine, Type of exercise: walking, Exercise limited by: respiratory conditions(s) Cardiac risk factors: Cardiac Risk Factors include: advanced age (>55men, >41 women);male gender;hypertension;dyslipidemia;sedentary lifestyle;smoking/ tobacco exposure Depression/mood screen:   Depression screen [facility] 2/9 07/25/2018  Decreased Interest 0  Down, Depressed, Hopeless 0  PHQ - 2 Score 0    ADLs:  In your present state of health, do you have any difficulty performing the following activities: 07/25/2018 04/22/2018  Hearing? N N  Vision? N N  Difficulty concentrating or making decisions? N N  Walking or climbing stairs? N N  Dressing or bathing? N N  Doing errands, shopping? N N  Some recent data might be hidden     Cognitive Testing  Alert? Yes  Normal Appearance?Yes  Oriented to person? Yes  Place? Yes   Time? Yes  Recall of three objects?  Yes  Can perform simple calculations? Yes  Displays appropriate judgment?Yes  Can read the correct time from a watch face?Yes  EOL planning: Does  Patient Have a Medical Advance Directive?: Yes Type of Advance Directive: Living will, Healthcare Power of Attorney Does patient want to make changes to medical advance directive?: No - Patient declined Copy of [place] in Chart?: No - copy requested   Objective:   Today's Vitals   07/25/18 1055  BP: 134/80  Pulse: 73  Temp: (!) 97.5 F (36.4 C)  SpO2: 97%  Weight: 217 lb 9.6 oz (98.7 kg)  Height: 5' 11.5" (1.816 m)   Body  mass index is 29.93 kg/m.  General appearance: alert, no distress, WD/WN, male HEENT: normocephalic, sclerae anicteric, TMs pearly, nares patent, no discharge or erythema, pharynx normal Oral cavity: MMM, no lesions Neck: supple, no lymphadenopathy, no thyromegaly, no masses Heart: RRR, normal S1, S2, no murmurs Lungs: CTA bilaterally, no wheezes, rhonchi, or rales Abdomen: +bs, soft, non tender, non distended, no masses, no hepatomegaly, no splenomegaly Musculoskeletal: nontender, no swelling, no obvious deformity Extremities: no edema, no cyanosis, no clubbing Pulses: 2+ symmetric, upper and lower extremities, normal cap refill Neurological: alert, oriented x 3, CN2-12 intact, strength normal upper extremities and lower extremities, sensation normal throughout, DTRs 2+ throughout, no cerebellar signs, gait normal Psychiatric: normal affect, behavior normal, pleasant   Medicare Attestation I have personally reviewed: The patient's medical and social history Their use of alcohol, tobacco or illicit drugs Their current medications and supplements The patient's functional ability including ADLs,fall risks, home safety risks, cognitive, and hearing and visual impairment Diet and physical activities Evidence for depression or mood disorders  The patient's weight, height, BMI, and visual acuity have been recorded in the chart.  I have made referrals, counseling, and provided education to the patient based on review of the above and I have provided the patient with a written personalized care plan for preventive services.     Izora Ribas, NP   07/25/2018

## 2018-07-25 ENCOUNTER — Ambulatory Visit (INDEPENDENT_AMBULATORY_CARE_PROVIDER_SITE_OTHER): Payer: Medicare Other | Admitting: Adult Health

## 2018-07-25 ENCOUNTER — Encounter: Payer: Self-pay | Admitting: Adult Health

## 2018-07-25 VITALS — BP 134/80 | HR 73 | Temp 97.5°F | Ht 71.5 in | Wt 217.6 lb

## 2018-07-25 DIAGNOSIS — E663 Overweight: Secondary | ICD-10-CM

## 2018-07-25 DIAGNOSIS — I2510 Atherosclerotic heart disease of native coronary artery without angina pectoris: Secondary | ICD-10-CM | POA: Diagnosis not present

## 2018-07-25 DIAGNOSIS — E039 Hypothyroidism, unspecified: Secondary | ICD-10-CM

## 2018-07-25 DIAGNOSIS — Z79899 Other long term (current) drug therapy: Secondary | ICD-10-CM | POA: Diagnosis not present

## 2018-07-25 DIAGNOSIS — F3341 Major depressive disorder, recurrent, in partial remission: Secondary | ICD-10-CM

## 2018-07-25 DIAGNOSIS — N320 Bladder-neck obstruction: Secondary | ICD-10-CM

## 2018-07-25 DIAGNOSIS — Z85118 Personal history of other malignant neoplasm of bronchus and lung: Secondary | ICD-10-CM

## 2018-07-25 DIAGNOSIS — E782 Mixed hyperlipidemia: Secondary | ICD-10-CM | POA: Diagnosis not present

## 2018-07-25 DIAGNOSIS — R7309 Other abnormal glucose: Secondary | ICD-10-CM

## 2018-07-25 DIAGNOSIS — R0989 Other specified symptoms and signs involving the circulatory and respiratory systems: Secondary | ICD-10-CM

## 2018-07-25 DIAGNOSIS — J701 Chronic and other pulmonary manifestations due to radiation: Secondary | ICD-10-CM

## 2018-07-25 DIAGNOSIS — R6889 Other general symptoms and signs: Secondary | ICD-10-CM | POA: Diagnosis not present

## 2018-07-25 DIAGNOSIS — Z0001 Encounter for general adult medical examination with abnormal findings: Secondary | ICD-10-CM | POA: Diagnosis not present

## 2018-07-25 DIAGNOSIS — E559 Vitamin D deficiency, unspecified: Secondary | ICD-10-CM

## 2018-07-25 DIAGNOSIS — Z0000 Encounter for general adult medical examination without abnormal findings: Secondary | ICD-10-CM

## 2018-07-25 DIAGNOSIS — J449 Chronic obstructive pulmonary disease, unspecified: Secondary | ICD-10-CM

## 2018-07-25 LAB — CBC WITH DIFFERENTIAL/PLATELET
Basophils Absolute: 78 cells/uL (ref 0–200)
Basophils Relative: 1 %
Eosinophils Absolute: 203 cells/uL (ref 15–500)
Eosinophils Relative: 2.6 %
HCT: 45.2 % (ref 38.5–50.0)
Hemoglobin: 15.4 g/dL (ref 13.2–17.1)
Lymphs Abs: 2036 cells/uL (ref 850–3900)
MCH: 32.4 pg (ref 27.0–33.0)
MCHC: 34.1 g/dL (ref 32.0–36.0)
MCV: 95 fL (ref 80.0–100.0)
MPV: 9.7 fL (ref 7.5–12.5)
Monocytes Relative: 12.1 %
Neutro Abs: 4540 cells/uL (ref 1500–7800)
Neutrophils Relative %: 58.2 %
Platelets: 244 10*3/uL (ref 140–400)
RBC: 4.76 10*6/uL (ref 4.20–5.80)
RDW: 12.3 % (ref 11.0–15.0)
Total Lymphocyte: 26.1 %
WBC mixed population: 944 cells/uL (ref 200–950)
WBC: 7.8 10*3/uL (ref 3.8–10.8)

## 2018-07-25 LAB — COMPLETE METABOLIC PANEL WITH GFR
AG Ratio: 1.5 (calc) (ref 1.0–2.5)
ALT: 15 U/L (ref 9–46)
AST: 26 U/L (ref 10–35)
Albumin: 4 g/dL (ref 3.6–5.1)
Alkaline phosphatase (APISO): 71 U/L (ref 40–115)
BUN: 15 mg/dL (ref 7–25)
CO2: 28 mmol/L (ref 20–32)
Calcium: 9.4 mg/dL (ref 8.6–10.3)
Chloride: 104 mmol/L (ref 98–110)
Creat: 1.17 mg/dL (ref 0.70–1.18)
GFR, Est African American: 73 mL/min/{1.73_m2} (ref >=60)
GFR, Est Non African American: 63 mL/min/{1.73_m2} (ref >=60)
Globulin: 2.7 g/dL (calc) (ref 1.9–3.7)
Glucose, Bld: 83 mg/dL (ref 65–99)
Potassium: 4.2 mmol/L (ref 3.5–5.3)
Sodium: 140 mmol/L (ref 135–146)
Total Bilirubin: 0.4 mg/dL (ref 0.2–1.2)
Total Protein: 6.7 g/dL (ref 6.1–8.1)

## 2018-07-25 LAB — LIPID PANEL
Cholesterol: 219 mg/dL — ABNORMAL HIGH (ref <200)
HDL: 56 mg/dL (ref >40)
LDL Cholesterol (Calc): 131 mg/dL (calc) — ABNORMAL HIGH
Non-HDL Cholesterol (Calc): 163 mg/dL (calc) — ABNORMAL HIGH (ref <130)
Total CHOL/HDL Ratio: 3.9 (calc) (ref <5.0)
Triglycerides: 183 mg/dL — ABNORMAL HIGH (ref <150)

## 2018-07-25 LAB — TSH: TSH: 2.38 mIU/L (ref 0.40–4.50)

## 2018-07-25 NOTE — Patient Instructions (Addendum)
Christopher Peck , Thank you for taking time to come for your Medicare Wellness Visit. I appreciate your ongoing commitment to your health goals. Please review the following plan we discussed and let me know if I can assist you in the future.   These are the goals we discussed: Goals    . Exercise 150 min/wk Moderate Activity    . LDL CALC < 100       This is a list of the screening recommended for you and due dates:  Health Maintenance  Topic Date Due  . Tetanus Vaccine  01/02/2022  . Flu Shot  Completed  . Pneumonia vaccines  Completed  . Colon Cancer Screening  Discontinued  .  Hepatitis C: One time screening is recommended by Center for Disease Control  (CDC) for  adults born from [birth year] through 1963.   Discontinued    Know what a healthy weight is for you (roughly BMI <25) and aim to maintain this  Aim for 7+ servings of fruits and vegetables daily  65-80+ fluid ounces of water or unsweet tea for healthy kidneys  Limit to max 1 drink of alcohol per day; avoid smoking/tobacco  Limit animal fats in diet for cholesterol and heart health - choose grass fed whenever available  Avoid highly processed foods, and foods high in saturated/trans fats  Aim for low stress - take time to unwind and care for your mental health  Aim for 150 min of moderate intensity exercise weekly for heart health, and weights twice weekly for bone health  Aim for 7-9 hours of sleep daily     Preventing High Cholesterol Cholesterol is a waxy, fat-like substance that your body needs in small amounts. Your liver makes all the cholesterol that your body needs. Having high cholesterol (hypercholesterolemia) increases your risk for heart disease and stroke. Extra (excess) cholesterol comes from the food you eat, such as animal-based fat (saturated fat) from meat and some dairy products. High cholesterol can often be prevented with diet and lifestyle changes. If you already have high cholesterol, you can control  it with diet and lifestyle changes, as well as medicine. What nutrition changes can be made?  Eat less saturated fat. Foods that contain saturated fat include red meat and some dairy products.  Avoid processed meats, like bacon and lunch meats.  Avoid trans fats, which are found in margarine and some baked goods.  Avoid foods and beverages that have added sugars.  Eat more fruits, vegetables, and whole grains.  Choose healthy sources of protein, such as fish, poultry, and nuts.  Choose healthy sources of fat, such as: ? Nuts. ? Vegetable oils, especially olive oil. ? Fish that have healthy fats (omega-3 fatty acids), such as mackerel or salmon. What lifestyle changes can be made?  Lose weight if you are overweight. Losing 5-10 lb (2.3-4.5 kg) can help prevent or control high cholesterol and reduce your risk for diabetes and high blood pressure. Ask your health care provider to help you with a diet and exercise plan to safely lose weight.  Get enough exercise. Do at least 150 minutes of moderate-intensity exercise each week. ? You could do this in short exercise sessions several times a day, or you could do longer exercise sessions a few times a week. For example, you could take a brisk 10-minute walk or bike ride, 3 times a day, for 5 days a week.  Do not smoke. If you need help quitting, ask your health care provider.  Limit  your alcohol intake. If you drink alcohol, limit alcohol intake to no more than 1 drink a day for nonpregnant women and 2 drinks a day for men. One drink equals 12 oz of beer, 5 oz of wine, or 1 oz of hard liquor. Why are these changes important? If you have high cholesterol, deposits (plaques) may build up on the walls of your blood vessels. Plaques make the arteries narrower and stiffer, which can restrict or block blood flow and cause blood clots to form. This greatly increases your risk for heart attack and stroke. Making diet and lifestyle changes can reduce  your risk for these life-threatening conditions. What can I do to lower my risk?  Manage your risk factors for high cholesterol. Talk with your health care provider about all of your risk factors and how to lower your risk.  Manage other conditions that you have, such as diabetes or high blood pressure (hypertension).  Have your cholesterol checked at regular intervals.  Keep all follow-up visits as told by your health care provider. This is important. How is this treated? In addition to diet and lifestyle changes, your health care provider may recommend medicines to help lower cholesterol, such as a medicine to reduce the amount of cholesterol made in your liver. You may need medicine if:  Diet and lifestyle changes do not lower your cholesterol enough.  You have high cholesterol and other risk factors for heart disease or stroke.  Take over-the-counter and prescription medicines only as told by your health care provider. Where to find more information:  American Heart Association: ThisTune.com.pt.jsp  National Heart, Lung, and Blood Institute: FrenchToiletries.com.cy Summary  High cholesterol increases your risk for heart disease and stroke. By keeping your cholesterol level low, you can reduce your risk for these conditions.  Diet and lifestyle changes are the most important steps in preventing high cholesterol.  Work with your health care provider to manage your risk factors, and have your blood tested regularly. This information is not intended to replace advice given to you by your health care provider. Make sure you discuss any questions you have with your health care provider. Document Released: 09/06/2015 Document Revised: 04/30/2016 Document Reviewed: 04/30/2016 Elsevier Interactive Patient Education  [employer].

## 2018-10-15 ENCOUNTER — Other Ambulatory Visit: Payer: Self-pay | Admitting: Adult Health

## 2018-10-15 ENCOUNTER — Other Ambulatory Visit: Payer: Self-pay | Admitting: Physician Assistant

## 2018-10-30 NOTE — Progress Notes (Signed)
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 -pateint cannot tolerated inhaled corticosteroids they cause CP  Medication management - CBC with Differential/Platelet - CMP WITH GFR  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  Personal history of lung cancer -resolved s/p radiation -followed by oncology  Hypothyroidism, unspecified type -TSH -cont levothyroxine -dose adjust if necessary   BPH/Prostatism -cont finasteride -pateint declined further medication  Hyperlipidemia, diet controlled -recommended statin therapy -patient refused  Other abnormal glucose -cont diet and exercise  Vitamin D deficiency -cont Vit D supplement  Over 30 minutes of exam, counseling, chart review, and critical decision making was performed  Future Appointments  Date Time Provider [place]  05/23/2019  3:00 PM Unk Pinto, MD [facility] None     Plan:   During the course of the visit the patient was educated and counseled about appropriate screening and preventive services including:    Pneumococcal vaccine   Influenza vaccine  Prevnar 13  Td vaccine  Screening electrocardiogram  Colorectal cancer screening  Diabetes screening  Glaucoma screening  Nutrition counseling    Subjective:  Christopher Peck is a 73 y.o. male who presents for Medicare Annual Wellness Visit and 3 month follow up for HTN, hyperlipidemia, glucose management, and vitamin D Def.   He has reportedly started taking CBD  oil daily with apparent good control of hip pain & insomnia.   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 He refuses to have any further CXR's or CT scans. Brother has pulmonary fibrosis from [place], wife had shingles.   BMI is Body mass index is 29.46 kg/m., he has not been working on diet and exercise, he is not interested in working on this.  Wt Readings from Last 3 Encounters:  10/31/18 214 lb 3.2 oz (97.2 kg)  07/25/18 217 lb 9.6 oz (98.7 kg)  04/19/18 214 lb 9.6 oz (97.3 kg)   His blood pressure has been controlled at home, today their BP is BP: 120/64 He does not workout. He denies chest pain, shortness of breath, dizziness. He reports that he has been doing yard work and has been trying to walk around as much as he can.    He is not on cholesterol medication and denies myalgias. His cholesterol is not at goal. The cholesterol last visit was:   Lab Results  Component Value Date   CHOL 219 (H) 07/25/2018   HDL 56 07/25/2018   LDLCALC 131 (H) 07/25/2018   TRIG 183 (H) 07/25/2018   CHOLHDL 3.9 07/25/2018    He has been working on diet and exercise for prediabetes, and denies increased appetite, nausea, paresthesia of the feet, polydipsia, polyuria, visual disturbances and vomiting. Last A1C in the office was:  Lab Results  Component Value Date   HGBA1C 5.5 04/19/2018   He is on thyroid medication. His medication was not changed last visit.   Lab Results  Component Value Date   TSH 2.38 07/25/2018  .  Last GFR Lab Results  Component Value Date   [facility] 63 07/25/2018   Patient  is not on Vitamin D supplement, declines to supplement, takes a multivitamin.  Lab Results  Component Value Date   VD25OH 34 04/19/2018       Medication Review: Current Outpatient Medications on File Prior to Visit  Medication Sig Dispense Refill  . albuterol (ACCUNEB) 1.25 MG/3ML nebulizer solution Take 3 mLs (1.25 mg  total) by nebulization every 6 (six) hours as needed for wheezing. 75 mL 99  . albuterol (PROAIR HFA) 108 (90 Base) MCG/ACT inhaler Take 1 to 2 inhalations  every 4 hours as needed to rescue Asthma. 48 g 3  . cholecalciferol (VITAMIN D) 1000 UNITS tablet Take 1,000 Units by mouth daily.    . citalopram (CELEXA) 20 MG tablet TAKE 1 TABLET BY MOUTH ONCE DAILY FOR MOOD 90 tablet 1  . finasteride (PROSCAR) 5 MG tablet TAKE 1 TABLET BY MOUTH EVERY DAY 90 tablet 1  . levothyroxine (SYNTHROID, LEVOTHROID) 125 MCG tablet TAKE 1 TABLET BY MOUTH EVERY DAY 90 tablet 0  . Magnesium 500 MG TABS Take by mouth. daily     No current facility-administered medications on file prior to visit.     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  Screening Tests Immunization History  Administered Date(s) Administered  . Influenza, High Dose Seasonal PF 07/21/2014, 06/19/2018  . Influenza-Unspecified 06/24/2015, 06/07/2017  . Pneumococcal Conjugate-13 06/24/2015  . Pneumococcal Polysaccharide-23 01/03/2012, 08/23/2017  . Td 01/03/2012    Preventative care: Last colonoscopy: never has had, refuses colonoscopy or cologuard, does hemoccult annually at CPE  Tetanus: 2013 Influenza: 2019 Pneumonia 2018 Prevnar 13: 2016 Shingles: declines  Names of Other Physician/Practitioners you currently use: 1. [facility] here for primary care 2. Dr. Gershon Crane, eye doctor, last visit 2018, will schedule, wears glasses 3. Does not see, dentist, last visit 2018, full dentures   Patient Care Team: Unk Pinto, MD as  PCP - General (Internal Medicine) Curt Bears, MD as Consulting Physician (Oncology) Rutherford Guys, MD as Consulting Physician (Ophthalmology)  Surgical: He  has no past surgical history on file. Family His family history includes COPD in his father; Kidney failure in his father. Social history  He reports that he quit smoking about 14 years ago. He has never used smokeless tobacco. He reports current alcohol use of about 3.0 standard drinks of alcohol per week. No history on file for drug.  MEDICARE WELLNESS OBJECTIVES: Physical activity: Current Exercise Habits: The patient does not participate in regular exercise at present(does things around the house) Cardiac risk factors: Cardiac Risk Factors include: advanced age (>63men, >66 women);dyslipidemia;hypertension;male gender;sedentary lifestyle Depression/mood screen:   Depression screen [facility] 2/9 10/31/2018  Decreased Interest 0  Down, Depressed, Hopeless 0  PHQ - 2 Score 0    ADLs:  In your present state of health, do you have any difficulty performing the following activities: 10/31/2018 07/25/2018  Hearing? N N  Vision? N N  Difficulty concentrating or making decisions? N N  Walking or climbing stairs? N N  Dressing or bathing? N N  Doing errands, shopping? N N  Some recent data might be hidden     Cognitive Testing  Alert? Yes  Normal Appearance?Yes  Oriented to person? Yes  Place? Yes   Time? Yes  Recall of three objects?  Yes  Can perform simple calculations? Yes  Displays appropriate judgment?Yes  Can read the correct time from a watch face?Yes  EOL planning: Does Patient Have a Medical Advance Directive?: Yes Type of Advance Directive: Healthcare Power of Attorney, Living will Does patient want to make changes to medical advance directive?: No - Patient declined   Objective:   Today's Vitals   10/31/18 1116  BP: 120/64  Pulse: 60  Temp: (!) 97.5 F (36.4 C)  SpO2: 98%  Weight: 214 lb 3.2 oz (97.2 kg)   Height: 5' 11.5" (1.816 m)   Body mass index is 29.46 kg/m.  General appearance: alert, no distress, WD/WN, male HEENT: normocephalic, sclerae anicteric, TMs pearly, nares patent, no discharge or erythema, pharynx normal Oral cavity: MMM, no lesions Neck: supple, no lymphadenopathy, no thyromegaly, no masses Heart: RRR, normal S1, S2, no murmurs Lungs: CTA bilaterally, no wheezes, rhonchi, or rales Abdomen: +bs, soft, non tender, non distended, no masses, no hepatomegaly, no splenomegaly Musculoskeletal: nontender, no swelling, no obvious deformity Extremities: no edema, no cyanosis, no clubbing Pulses: 2+ symmetric, upper and lower extremities, normal cap refill Neurological: alert, oriented x 3, CN2-12 intact, strength normal upper extremities and lower extremities, sensation normal throughout, DTRs 2+ throughout, no cerebellar signs, gait normal Psychiatric: normal affect, behavior normal, pleasant   Medicare Attestation I have personally reviewed: The patient's medical and social history Their use of alcohol, tobacco or illicit drugs Their current medications and supplements The patient's functional ability including ADLs,fall risks, home safety risks, cognitive, and hearing and visual impairment Diet and physical activities Evidence for depression or mood disorders  The patient's weight, height, BMI, and visual acuity have been recorded in the chart.  I have made referrals, counseling, and provided education to the patient based on review of the above and I have provided the patient with a written personalized care plan for preventive services.     Vicie Mutters, PA-C   10/31/2018

## 2018-10-31 ENCOUNTER — Ambulatory Visit (INDEPENDENT_AMBULATORY_CARE_PROVIDER_SITE_OTHER): Payer: Medicare Other | Admitting: Physician Assistant

## 2018-10-31 ENCOUNTER — Encounter: Payer: Self-pay | Admitting: Physician Assistant

## 2018-10-31 VITALS — BP 120/64 | HR 60 | Temp 97.5°F | Ht 71.5 in | Wt 214.2 lb

## 2018-10-31 DIAGNOSIS — J449 Chronic obstructive pulmonary disease, unspecified: Secondary | ICD-10-CM | POA: Diagnosis not present

## 2018-10-31 DIAGNOSIS — R7309 Other abnormal glucose: Secondary | ICD-10-CM

## 2018-10-31 DIAGNOSIS — E039 Hypothyroidism, unspecified: Secondary | ICD-10-CM

## 2018-10-31 DIAGNOSIS — F3341 Major depressive disorder, recurrent, in partial remission: Secondary | ICD-10-CM | POA: Diagnosis not present

## 2018-10-31 DIAGNOSIS — Z0001 Encounter for general adult medical examination with abnormal findings: Secondary | ICD-10-CM | POA: Diagnosis not present

## 2018-10-31 DIAGNOSIS — E663 Overweight: Secondary | ICD-10-CM

## 2018-10-31 DIAGNOSIS — R6889 Other general symptoms and signs: Secondary | ICD-10-CM

## 2018-10-31 DIAGNOSIS — Z0000 Encounter for general adult medical examination without abnormal findings: Secondary | ICD-10-CM

## 2018-10-31 DIAGNOSIS — E782 Mixed hyperlipidemia: Secondary | ICD-10-CM | POA: Diagnosis not present

## 2018-10-31 DIAGNOSIS — I2510 Atherosclerotic heart disease of native coronary artery without angina pectoris: Secondary | ICD-10-CM | POA: Diagnosis not present

## 2018-10-31 DIAGNOSIS — R0989 Other specified symptoms and signs involving the circulatory and respiratory systems: Secondary | ICD-10-CM | POA: Diagnosis not present

## 2018-10-31 DIAGNOSIS — J701 Chronic and other pulmonary manifestations due to radiation: Secondary | ICD-10-CM

## 2018-10-31 DIAGNOSIS — N320 Bladder-neck obstruction: Secondary | ICD-10-CM

## 2018-10-31 DIAGNOSIS — E559 Vitamin D deficiency, unspecified: Secondary | ICD-10-CM

## 2018-10-31 DIAGNOSIS — Z85118 Personal history of other malignant neoplasm of bronchus and lung: Secondary | ICD-10-CM

## 2018-10-31 NOTE — Patient Instructions (Signed)
Use a dropper or use a cap to put peroxide, olive oil,mineral oil or canola oil in the effected ear- 2-3 times a week. Let it soak for 20-30 min then you can take a shower or use a baby bulb with warm water to wash out the ear wax.  Can get debrox kit and use that as well Do not use Qtips   Chronic Obstructive Pulmonary Disease  Chronic obstructive pulmonary disease (COPD) is a long-term (chronic) condition that affects the lungs. COPD is a general term that can be used to describe many different lung problems that cause lung swelling (inflammation) and limit airflow, including chronic bronchitis and emphysema. If you have COPD, your lung function will probably never return to normal. In most cases, it gets worse over time. However, there are steps you can take to slow the progression of the disease and improve your quality of life. What are the causes? This condition may be caused by:  Smoking. This is the most common cause.  Certain genes passed down through families. What increases the risk? The following factors may make you more likely to develop this condition:  Secondhand smoke from cigarettes, pipes, or cigars.  Exposure to chemicals and other irritants such as fumes and dust in the work environment.  Chronic lung conditions or infections. What are the signs or symptoms? Symptoms of this condition include:  Shortness of breath, especially during physical activity.  Chronic cough with a large amount of thick mucus. Sometimes the cough may not have any mucus (dry cough).  Wheezing.  Rapid breaths.  Gray or bluish discoloration (cyanosis) of the skin, especially in your fingers, toes, or lips.  Feeling tired (fatigue).  Weight loss.  Chest tightness.  Frequent infections.  Episodes when breathing symptoms become much worse (exacerbations).  Swelling in the ankles, feet, or legs. This may occur in later stages of the disease. How is this diagnosed? This condition is  diagnosed based on:  Your medical history.  A physical exam. You may also have tests, including:  Lung (pulmonary) function tests. This may include a spirometry test, which measures your ability to exhale properly.  Chest X-ray.  CT scan.  Blood tests. How is this treated? This condition may be treated with:  Medicines. These may include inhaled rescue medicines to treat acute exacerbations as well as long-term, or maintenance, medicines to prevent flare-ups of COPD. ? Bronchodilators help treat COPD by dilating the airways to allow increased airflow and make your breathing more comfortable. ? Steroids can reduce airway inflammation and help prevent exacerbations.  Smoking cessation. If you smoke, your health care provider may ask you to quit, and may also recommend therapy or replacement products to help you quit.  Pulmonary rehabilitation. This may involve working with a team of health care providers and specialists, such as respiratory, occupational, and physical therapists.  Exercise and physical activity. These are beneficial for nearly all people with COPD.  Nutrition therapy to gain weight, if you are underweight.  Oxygen. Supplemental oxygen therapy is only helpful if you have a low oxygen level in your blood (hypoxemia).  Lung surgery or transplant.  Palliative care. This is to help people with COPD feel comfortable when treatment is no longer working. Follow these instructions at home: Medicines  Take over-the-counter and prescription medicines (inhaled or pills) only as told by your health care provider.  Talk to your health care provider before taking any cough or allergy medicines. You may need to avoid certain medicines that  dry out your airways. Lifestyle  If you are a smoker, the most important thing that you can do is to stop smoking. Do not use any products that contain nicotine or tobacco, such as cigarettes and e-cigarettes. If you need help quitting, ask  your health care provider. Continuing to smoke will cause the disease to progress faster.  Avoid exposure to things that irritate your lungs, such as smoke, chemicals, and fumes.  Stay active, but balance activity with periods of rest. Exercise and physical activity will help you maintain your ability to do things you want to do.  Learn and use relaxation techniques to manage stress and to control your breathing.  Get the right amount of sleep and get quality sleep. Most adults need 7 or more hours per night.  Eat healthy foods. Eating smaller, more frequent meals and resting before meals may help you maintain your strength. Controlled breathing Learn and use controlled breathing techniques as directed by your health care provider. Controlled breathing techniques include:  Pursed lip breathing. Start by breathing in (inhaling) through your nose for 1 second. Then, purse your lips as if you were going to whistle and breathe out (exhale) through the pursed lips for 2 seconds.  Diaphragmatic breathing. Start by putting one hand on your abdomen just above your waist. Inhale slowly through your nose. The hand on your abdomen should move out. Then purse your lips and exhale slowly. You should be able to feel the hand on your abdomen moving in as you exhale. Controlled coughing Learn and use controlled coughing to clear mucus from your lungs. Controlled coughing is a series of short, progressive coughs. The steps of controlled coughing are: 1. Lean your head slightly forward. 2. Breathe in deeply using diaphragmatic breathing. 3. Try to hold your breath for 3 seconds. 4. Keep your mouth slightly open while coughing twice. 5. Spit any mucus out into a tissue. 6. Rest and repeat the steps once or twice as needed. General instructions  Make sure you receive all the vaccines that your health care provider recommends, especially the pneumococcal and influenza vaccines. Preventing infection and  hospitalization is very important when you have COPD.  Use oxygen therapy and pulmonary rehabilitation if directed to by your health care provider. If you require home oxygen therapy, ask your health care provider whether you should purchase a pulse oximeter to measure your oxygen level at home.  Work with your health care provider to develop a COPD action plan. This will help you know what steps to take if your condition gets worse.  Keep other chronic health conditions under control as told by your health care provider.  Avoid extreme temperature and humidity changes.  Avoid contact with people who have an illness that spreads from person to person (is contagious), such as viral infections or pneumonia.  Keep all follow-up visits as told by your health care provider. This is important. Contact a health care provider if:  You are coughing up more mucus than usual.  There is a change in the color or thickness of your mucus.  Your breathing is more labored than usual.  Your breathing is faster than usual.  You have difficulty sleeping.  You need to use your rescue medicines or inhalers more often than expected.  You have trouble doing routine activities such as getting dressed or walking around the house. Get help right away if:  You have shortness of breath while you are resting.  You have shortness of breath that  prevents you from: ? Being able to talk. ? Performing your usual physical activities.  You have chest pain lasting longer than 5 minutes.  Your skin color is more blue (cyanotic) than usual.  You measure low oxygen saturations for longer than 5 minutes with a pulse oximeter.  You have a fever.  You feel too tired to breathe normally. Summary  Chronic obstructive pulmonary disease (COPD) is a long-term (chronic) condition that affects the lungs.  Your lung function will probably never return to normal. In most cases, it gets worse over time. However, there  are steps you can take to slow the progression of the disease and improve your quality of life.  Treatment for COPD may include taking medicines, quitting smoking, pulmonary rehabilitation, and changes to diet and exercise. As the disease progresses, you may need oxygen therapy, a lung transplant, or palliative care.  To help manage your condition, do not smoke, avoid exposure to things that irritate your lungs, stay up to date on all vaccines, and follow your health care provider's instructions for taking medicines. This information is not intended to replace advice given to you by your health care provider. Make sure you discuss any questions you have with your health care provider. Document Released: 06/01/2005 Document Revised: 02/15/2017 Document Reviewed: 09/26/2016 Elsevier Interactive Patient Education  2019 [employer].

## 2018-11-01 LAB — CBC WITH DIFFERENTIAL/PLATELET
Absolute Monocytes: 1078 cells/uL — ABNORMAL HIGH (ref 200–950)
Basophils Absolute: 69 cells/uL (ref 0–200)
Basophils Relative: 0.9 %
Eosinophils Absolute: 131 cells/uL (ref 15–500)
Eosinophils Relative: 1.7 %
HCT: 45.4 % (ref 38.5–50.0)
Hemoglobin: 15 g/dL (ref 13.2–17.1)
Lymphs Abs: 1925 cells/uL (ref 850–3900)
MCH: 31.5 pg (ref 27.0–33.0)
MCHC: 33 g/dL (ref 32.0–36.0)
MCV: 95.4 fL (ref 80.0–100.0)
MPV: 10.1 fL (ref 7.5–12.5)
Monocytes Relative: 14 %
Neutro Abs: 4497 cells/uL (ref 1500–7800)
Neutrophils Relative %: 58.4 %
Platelets: 225 10*3/uL (ref 140–400)
RBC: 4.76 10*6/uL (ref 4.20–5.80)
RDW: 12.4 % (ref 11.0–15.0)
Total Lymphocyte: 25 %
WBC: 7.7 10*3/uL (ref 3.8–10.8)

## 2018-11-01 LAB — COMPLETE METABOLIC PANEL WITH GFR
AG Ratio: 1.6 (calc) (ref 1.0–2.5)
ALT: 15 U/L (ref 9–46)
AST: 26 U/L (ref 10–35)
Albumin: 4.1 g/dL (ref 3.6–5.1)
Alkaline phosphatase (APISO): 67 U/L (ref 35–144)
BUN: 15 mg/dL (ref 7–25)
CO2: 26 mmol/L (ref 20–32)
Calcium: 9.4 mg/dL (ref 8.6–10.3)
Chloride: 104 mmol/L (ref 98–110)
Creat: 1.12 mg/dL (ref 0.70–1.18)
GFR, Est African American: 77 mL/min/{1.73_m2} (ref >=60)
GFR, Est Non African American: 66 mL/min/{1.73_m2} (ref >=60)
Globulin: 2.6 g/dL (calc) (ref 1.9–3.7)
Glucose, Bld: 83 mg/dL (ref 65–99)
Potassium: 4.5 mmol/L (ref 3.5–5.3)
Sodium: 139 mmol/L (ref 135–146)
Total Bilirubin: 0.4 mg/dL (ref 0.2–1.2)
Total Protein: 6.7 g/dL (ref 6.1–8.1)

## 2018-11-01 LAB — LIPID PANEL
Cholesterol: 217 mg/dL — ABNORMAL HIGH (ref <200)
HDL: 62 mg/dL (ref >=40)
LDL Cholesterol (Calc): 130 mg/dL (calc) — ABNORMAL HIGH
Non-HDL Cholesterol (Calc): 155 mg/dL (calc) — ABNORMAL HIGH (ref <130)
Total CHOL/HDL Ratio: 3.5 (calc) (ref <5.0)
Triglycerides: 133 mg/dL (ref <150)

## 2018-11-01 LAB — TSH: TSH: 1.23 mIU/L (ref 0.40–4.50)

## 2018-12-26 ENCOUNTER — Other Ambulatory Visit: Payer: Self-pay | Admitting: Adult Health

## 2019-01-22 ENCOUNTER — Other Ambulatory Visit: Payer: Self-pay | Admitting: Physician Assistant

## 2019-03-29 ENCOUNTER — Other Ambulatory Visit: Payer: Self-pay | Admitting: Adult Health

## 2019-04-23 ENCOUNTER — Other Ambulatory Visit: Payer: Self-pay | Admitting: Internal Medicine

## 2019-04-23 DIAGNOSIS — J4520 Mild intermittent asthma, uncomplicated: Secondary | ICD-10-CM

## 2019-05-22 ENCOUNTER — Encounter: Payer: Self-pay | Admitting: Internal Medicine

## 2019-05-22 NOTE — Patient Instructions (Signed)

## 2019-05-22 NOTE — Progress Notes (Signed)
Annual  Screening/Preventative Visit  & Comprehensive Evaluation & Examination     This very nice 73 y.o. MWM presents for a Screening /Preventative Visit & comprehensive evaluation and management of multiple medical co-morbidities.  Patient has been followed for HTN, HLD, Prediabetes and Vitamin D Deficiency. He has hx/o hip pains & Insomnia for which he takes CBD oil with apparent good results.     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 He refuses any f/u CXR's / Lung CT scans.      Patient has hx/o labile HTN predates since 2012.   Today's BP is at goal - 126/84.  Patient relates hx/o pAfib (CHADsVASc = 2)  since age 78 yo intermittently over the years & refuses to take meds Patient denies any cardiac symptoms as chest pain, palpitations, shortness of breath, dizziness or ankle swelling.     Patient's hyperlipidemia is not controlled with diet and he refuses to take meds for cholesterol: Lab Results  Component Value Date   CHOL 217 (H) 10/31/2018   HDL 62 10/31/2018   LDLCALC 130 (H) 10/31/2018   TRIG 133 10/31/2018   CHOLHDL 3.5 10/31/2018      Patient  Has been monitored expectantly for glucose intolerance  and patient denies reactive hypoglycemic symptoms, visual blurring, diabetic polys or paresthesias. Last A1c was Normal & at goal: Lab Results  Component Value Date   HGBA1C 5.5 04/19/2018       Patient was dx'd Hypothyroid in in 2013 and has agreed to take meds.     Finally, patient has history of Vitamin D Deficiency ("34" / 2008 &"23" / 2016) and  As he does not take recommended Vit D supplements, his last vitamin D was still very low : Lab Results  Component Value Date   VD25OH 34 04/19/2018   Current Outpatient Medications on File Prior to Visit  Medication Sig  . albuterol (ACCUNEB) 1.25 MG/3ML nebulizer solution Take 3 mLs (1.25 mg total) by nebulization every 6 (six) hours as needed for  wheezing.  Marland Kitchen albuterol (PROAIR HFA) 108 (90 Base) MCG/ACT inhaler TAKE 1 TO 2 INHALATIONS EVERY 4 HOURS AS NEEDED TO RESCUE ASTHMA.  . citalopram (CELEXA) 20 MG tablet TAKE 1 TABLET BY MOUTH ONCE DAILY FOR MOOD  . finasteride (PROSCAR) 5 MG tablet TAKE 1 TABLET BY MOUTH EVERY DAY  . levothyroxine (SYNTHROID) 125 MCG tablet Take 1 tablet daily on an empty stomach with only water for 30 minutes & no Antacid meds, Calcium or Magnesium for 4 hours & avoid Biotin  . cholecalciferol (VITAMIN D) 1000 UNITS tablet Take 1,000 Units by mouth daily.  . Magnesium 500 MG TABS Take by mouth. daily   No current facility-administered medications on file prior to visit.    No Known Allergies   Past Medical History:  Diagnosis Date  . Cancer ([place])   . COPD (chronic obstructive pulmonary disease) ([facility])   . Emphysema of lung ([place])   . History of bilateral inguinal hernia repair 05/11/2009  . Hyperlipidemia   . Hypertension   . Hypothyroid   . Other abnormal glucose    Health Maintenance  Topic Date Due  . TETANUS/TDAP  01/02/2022  . INFLUENZA VACCINE  Completed  . PNA vac Low Risk Adult  Completed  . COLONOSCOPY  Discontinued  . Hepatitis C Screening  Discontinued   Immunization History  Administered Date(s) Administered  . Influenza, High Dose Seasonal PF 07/21/2014, 06/19/2018,  05/23/2019  . Influenza-Unspecified 06/24/2015, 06/07/2017  . Pneumococcal Conjugate-13 06/24/2015  . Pneumococcal Polysaccharide-23 01/03/2012, 08/23/2017  . Td 01/03/2012   Last Colon - Never - also refuses Cologard  History reviewed.   Family History  Problem Relation Age of Onset  . COPD Father   . Kidney failure Father    Social History   Socioeconomic History  . Marital status: Married    Spouse name: Raquel Sarna  . Number of children: 2 sons  Occupational History   Retired  Tobacco Use  . Smoking status: Former Smoker    Quit date: 09/05/2004    Years since quitting: 14.7  . Smokeless tobacco: Never Used   Substance and Sexual Activity  . Alcohol use: Yes    Alcohol/week: 3.0 standard drinks    Types: 3 Standard drinks or equivalent per week    ROS Constitutional: Denies fever, chills, weight loss/gain, headaches, insomnia,  night sweats or change in appetite. Does c/o fatigue. Eyes: Denies redness, blurred vision, diplopia, discharge, itchy or watery eyes.  ENT: Denies discharge, congestion, post nasal drip, epistaxis, sore throat, earache, hearing loss, dental pain, Tinnitus, Vertigo, Sinus pain or snoring.  Cardio: Denies chest pain, palpitations, irregular heartbeat, syncope, dyspnea, diaphoresis, orthopnea, PND, claudication or edema Respiratory: denies cough, dyspnea, DOE, pleurisy, hoarseness, laryngitis or wheezing.  Gastrointestinal: Denies dysphagia, heartburn, reflux, water brash, pain, cramps, nausea, vomiting, bloating, diarrhea, constipation, hematemesis, melena, hematochezia, jaundice or hemorrhoids Genitourinary: Denies dysuria, frequency, urgency, nocturia, hesitancy, discharge, hematuria or flank pain Musculoskeletal: Denies arthralgia, myalgia, stiffness, Jt. Swelling, pain, limp or strain/sprain. Denies Falls. Skin: Denies puritis, rash, hives, warts, acne, eczema or change in skin lesion Neuro: No weakness, tremor, incoordination, spasms, paresthesia or pain Psychiatric: Denies confusion, memory loss or sensory loss. Denies Depression. Endocrine: Denies change in weight, skin, hair change, nocturia, and paresthesia, diabetic polys, visual blurring or hyper / hypo glycemic episodes.  Heme/Lymph: No excessive bleeding, bruising or enlarged lymph nodes.  Physical Exam  BP 126/84   Pulse 60   Temp (!) 97 F (36.1 C)   Resp 16   Ht 5' 11.5" (1.816 m)   Wt 208 lb 12.8 oz (94.7 kg)   BMI 28.72 kg/m   General Appearance: Well nourished and well groomed and in no apparent distress.  Eyes: PERRLA, EOMs, conjunctiva no swelling or erythema, normal fundi and vessels.  Sinuses: No frontal/maxillary tenderness ENT/Mouth: EACs patent / TMs  nl. Nares clear without erythema, swelling, mucoid exudates. Oral hygiene is good. No erythema, swelling, or exudate. Tongue normal, non-obstructing. Tonsils not swollen or erythematous. Hearing normal.  Neck: Supple, thyroid not palpable. No bruits, nodes or JVD. Respiratory: Respiratory effort normal.  BS equal and clear bilateral without rales, rhonci, wheezing or stridor. Cardio: Heart sounds are normal with regular rate and rhythm and no murmurs, rubs or gallops. Peripheral pulses are normal and equal bilaterally without edema. No aortic or femoral bruits. Chest: symmetric with normal excursions and percussion.  Abdomen: Soft, with Nl bowel sounds. Nontender, no guarding, rebound, hernias, masses, or organomegaly.  Lymphatics: Non tender without lymphadenopathy.  Musculoskeletal: Full ROM all peripheral extremities, joint stability, 5/5 strength, and normal gait. Skin: Warm and dry without rashes, lesions, cyanosis, clubbing or  ecchymosis.  Neuro: Cranial nerves intact, reflexes equal bilaterally. Normal muscle tone, no cerebellar symptoms. Sensation intact.  Pysch: Alert and oriented X 3 with normal affect, insight and judgment appropriate.   Assessment and Plan  1. Annual Preventative/Screening Exam   2. Labile hypertension  -  EKG 12-Lead - [place], RETROPERITNL ABD,  LTD - Urinalysis, Routine w reflex microscopic - CBC with Differential/Platelet - COMPLETE METABOLIC PANEL WITH GFR  3. Hyperlipidemia, mixed  - EKG 12-Lead - [place], RETROPERITNL ABD,  LTD - TSH  4. Abnormal glucose  - EKG 12-Lead - [place], RETROPERITNL ABD,  LTD  5. 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 Hypothyroidism, unspecified type  - TSH  8. COPD  9. Personal history of lung cancer  10. BPH/Prostatism  - PSA  11. Prostate cancer screening  - PSA  12. Screening for colorectal cancer  - POC  Hemoccult Bld/Stl  13. Screening for ischemic heart disease  - EKG 12-Lead  14. Former smoker  - EKG 12-Lead - [place], RETROPERITNL ABD,  LTD  15. Screening for AAA (aortic abdominal aneurysm)  - [place], RETROPERITNL ABD,  LTD  16. Medication management  - Urinalysis, Routine w reflex microscopic - POC Hemoccult Bld/Stl (3-Cd Home Screen); Future - PSA - CBC with Differential/Platelet - COMPLETE METABOLIC PANEL WITH GFR - TSH  17. Need for immunization against influenza  - Flu vaccine HIGH DOSE PF (Fluzone High dose)        Patient was counseled in prudent diet, weight control to achieve/maintain BMI less than 25, BP monitoring, regular exercise and medications as discussed.  Discussed med effects and SE's. Routine screening labs and tests as requested with regular follow-up as recommended. Over 40 minutes of exam, counseling, chart review and high complex critical decision making was performed   Kirtland Bouchard, MD

## 2019-05-23 ENCOUNTER — Ambulatory Visit (INDEPENDENT_AMBULATORY_CARE_PROVIDER_SITE_OTHER): Payer: Medicare Other | Admitting: Internal Medicine

## 2019-05-23 ENCOUNTER — Other Ambulatory Visit: Payer: Self-pay

## 2019-05-23 VITALS — BP 126/84 | HR 60 | Temp 97.0°F | Resp 16 | Ht 71.5 in | Wt 208.8 lb

## 2019-05-23 DIAGNOSIS — Z0000 Encounter for general adult medical examination without abnormal findings: Secondary | ICD-10-CM | POA: Diagnosis not present

## 2019-05-23 DIAGNOSIS — E782 Mixed hyperlipidemia: Secondary | ICD-10-CM

## 2019-05-23 DIAGNOSIS — R0989 Other specified symptoms and signs involving the circulatory and respiratory systems: Secondary | ICD-10-CM | POA: Diagnosis not present

## 2019-05-23 DIAGNOSIS — Z0001 Encounter for general adult medical examination with abnormal findings: Secondary | ICD-10-CM

## 2019-05-23 DIAGNOSIS — Z136 Encounter for screening for cardiovascular disorders: Secondary | ICD-10-CM | POA: Diagnosis not present

## 2019-05-23 DIAGNOSIS — Z23 Encounter for immunization: Secondary | ICD-10-CM

## 2019-05-23 DIAGNOSIS — E039 Hypothyroidism, unspecified: Secondary | ICD-10-CM

## 2019-05-23 DIAGNOSIS — N320 Bladder-neck obstruction: Secondary | ICD-10-CM

## 2019-05-23 DIAGNOSIS — Z1211 Encounter for screening for malignant neoplasm of colon: Secondary | ICD-10-CM

## 2019-05-23 DIAGNOSIS — Z87891 Personal history of nicotine dependence: Secondary | ICD-10-CM

## 2019-05-23 DIAGNOSIS — E559 Vitamin D deficiency, unspecified: Secondary | ICD-10-CM

## 2019-05-23 DIAGNOSIS — J701 Chronic and other pulmonary manifestations due to radiation: Secondary | ICD-10-CM

## 2019-05-23 DIAGNOSIS — Z125 Encounter for screening for malignant neoplasm of prostate: Secondary | ICD-10-CM

## 2019-05-23 DIAGNOSIS — I10 Essential (primary) hypertension: Secondary | ICD-10-CM | POA: Diagnosis not present

## 2019-05-23 DIAGNOSIS — Z85118 Personal history of other malignant neoplasm of bronchus and lung: Secondary | ICD-10-CM

## 2019-05-23 DIAGNOSIS — Z79899 Other long term (current) drug therapy: Secondary | ICD-10-CM

## 2019-05-23 DIAGNOSIS — J449 Chronic obstructive pulmonary disease, unspecified: Secondary | ICD-10-CM

## 2019-05-23 DIAGNOSIS — R7309 Other abnormal glucose: Secondary | ICD-10-CM

## 2019-05-24 LAB — CBC WITH DIFFERENTIAL/PLATELET
Absolute Monocytes: 989 cells/uL — ABNORMAL HIGH (ref 200–950)
Basophils Absolute: 69 cells/uL (ref 0–200)
Basophils Relative: 0.8 %
Eosinophils Absolute: 215 cells/uL (ref 15–500)
Eosinophils Relative: 2.5 %
HCT: 46.1 % (ref 38.5–50.0)
Hemoglobin: 15.4 g/dL (ref 13.2–17.1)
Lymphs Abs: 2055 cells/uL (ref 850–3900)
MCH: 32.8 pg (ref 27.0–33.0)
MCHC: 33.4 g/dL (ref 32.0–36.0)
MCV: 98.1 fL (ref 80.0–100.0)
MPV: 9.7 fL (ref 7.5–12.5)
Monocytes Relative: 11.5 %
Neutro Abs: 5272 cells/uL (ref 1500–7800)
Neutrophils Relative %: 61.3 %
Platelets: 242 10*3/uL (ref 140–400)
RBC: 4.7 10*6/uL (ref 4.20–5.80)
RDW: 12.4 % (ref 11.0–15.0)
Total Lymphocyte: 23.9 %
WBC: 8.6 10*3/uL (ref 3.8–10.8)

## 2019-05-24 LAB — URINALYSIS, ROUTINE W REFLEX MICROSCOPIC
Bilirubin Urine: NEGATIVE
Glucose, UA: NEGATIVE
Hgb urine dipstick: NEGATIVE
Ketones, ur: NEGATIVE
Leukocytes,Ua: NEGATIVE
Nitrite: NEGATIVE
Protein, ur: NEGATIVE
Specific Gravity, Urine: 1.011 (ref 1.001–1.03)
pH: 6.5 (ref 5.0–8.0)

## 2019-05-24 LAB — COMPLETE METABOLIC PANEL WITH GFR
AG Ratio: 1.5 (calc) (ref 1.0–2.5)
ALT: 14 U/L (ref 9–46)
AST: 25 U/L (ref 10–35)
Albumin: 3.9 g/dL (ref 3.6–5.1)
Alkaline phosphatase (APISO): 66 U/L (ref 35–144)
BUN: 15 mg/dL (ref 7–25)
CO2: 27 mmol/L (ref 20–32)
Calcium: 9.1 mg/dL (ref 8.6–10.3)
Chloride: 103 mmol/L (ref 98–110)
Creat: 1.14 mg/dL (ref 0.70–1.18)
GFR, Est African American: 75 mL/min/{1.73_m2} (ref >=60)
GFR, Est Non African American: 65 mL/min/{1.73_m2} (ref >=60)
Globulin: 2.6 g/dL (calc) (ref 1.9–3.7)
Glucose, Bld: 79 mg/dL (ref 65–99)
Potassium: 4.2 mmol/L (ref 3.5–5.3)
Sodium: 139 mmol/L (ref 135–146)
Total Bilirubin: 0.4 mg/dL (ref 0.2–1.2)
Total Protein: 6.5 g/dL (ref 6.1–8.1)

## 2019-05-24 LAB — TSH: TSH: 1.89 mIU/L (ref 0.40–4.50)

## 2019-05-24 LAB — PSA: PSA: 0.3 ng/mL (ref <=4.0)

## 2019-05-25 ENCOUNTER — Encounter: Payer: Self-pay | Admitting: Internal Medicine

## 2019-06-26 ENCOUNTER — Other Ambulatory Visit: Payer: Self-pay | Admitting: Internal Medicine

## 2019-08-14 ENCOUNTER — Other Ambulatory Visit: Payer: Self-pay | Admitting: Internal Medicine

## 2019-08-14 DIAGNOSIS — J4520 Mild intermittent asthma, uncomplicated: Secondary | ICD-10-CM

## 2019-08-25 NOTE — Progress Notes (Signed)
THIS ENCOUNTER IS A VIRTUAL/TELEPHONE VISIT DUE TO COVID-19 - PATIENT WAS NOT SEEN IN THE OFFICE.  PATIENT HAS CONSENTED TO VIRTUAL VISIT / TELEMEDICINE VISIT  This provider placed a call to Gwenevere Abbot using telephone, his appointment was changed to a virtual office visit to reduce the risk of exposure to the COVID-19 virus and to help Jayanth E Mccauley remain healthy and safe. The virtual visit will also provide continuity of care. He verbalizes understanding.    Assessment and Plan:    Labile hypertension Monitor  COPD Continue albuterol, doing well with this, states he is able to do ADL's/work with this and has had many bad reactions to other inhalers/products.   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   Depression, major, recurrent, in partial remission ([place]) - continue medications, stress management techniques discussed, increase water, good sleep hygiene discussed, increase exercise, and increase veggies. In remission.   Abnormal glucose Discussed disease progression and risks Discussed diet/exercise, weight management and risk modification  Personal history of lung cancer Patient has HISTORY of lung cancer  He is not wanting to come into the office due to be high risk.  Will contact in 3 month via my chart to check in, may do lab only at that time.   Continue diet and meds as discussed. Further disposition pending results of labs. Future Appointments  Date Time Provider [place]  08/26/2019  4:15 PM Vicie Mutters, PA-C [facility] None  06/22/2020  3:00 PM Unk Pinto, MD [facility] None     HPI 73 y.o. male  presents for 3 month follow up with hypertension, hyperlipidemia, prediabetes and vitamin D.   His blood pressure has been controlled at home,  today their BP is .   He does not workout. He denies chest pain, shortness of breath, dizziness.    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   He is on CBD for pain, states it is helping, will topical and oral 10 mg as needed.    He is not on cholesterol medication and denies myalgias. His cholesterol is at goal. The cholesterol last visit was:   Lab Results  Component Value Date   CHOL 217 (H) 10/31/2018   HDL 62 10/31/2018   LDLCALC 130 (H) 10/31/2018   TRIG 133 10/31/2018   CHOLHDL 3.5 10/31/2018    He has been working on diet and exercise for prediabetes, and denies foot ulcerations, hyperglycemia, hypoglycemia , increased appetite, nausea, paresthesia of the feet, polydipsia, polyuria, visual disturbances, vomiting and weight loss. Last A1C in the office was:  Lab Results  Component Value Date   HGBA1C 5.5 04/19/2018   Patient is NOT on Vitamin D supplement, just on MVIT Lab Results  Component Value Date   VD25OH 34 04/19/2018     He is on thyroid medication. His medication was not changed last visit.   Lab Results  Component Value Date   TSH 1.89 05/23/2019  .    Current Medications:   Current Outpatient Medications (Endocrine & Metabolic):  .  levothyroxine (SYNTHROID) 125 MCG tablet, TAKE 1 TABLET DAILY ON AN EMPTY STOMACH WITH ONLY WATER FOR 30 MINUTES &  NO ANTACID MEDS, CALCIUM OR MAGNESIUM FOR 4 HOURS & AVOID BIOTIN   Current Outpatient Medications (Respiratory):  .  albuterol (ACCUNEB) 1.25 MG/3ML nebulizer solution, TAKE 3 MLS (1.25 MG TOTAL) BY NEBULIZATION EVERY 6 (SIX) HOURS AS NEEDED FOR WHEEZING. Marland Kitchen  albuterol (PROAIR HFA) 108 (90 Base) MCG/ACT inhaler, TAKE 1 TO 2 INHALATIONS EVERY 4 HOURS AS NEEDED TO RESCUE ASTHMA.    Current Outpatient Medications (Other):  .  cholecalciferol  (VITAMIN D) 1000 UNITS tablet, Take 1,000 Units by mouth daily. .  citalopram (CELEXA) 20 MG tablet, Take 1 tablet Daily for Mood .  finasteride (PROSCAR) 5 MG tablet, TAKE 1 TABLET BY MOUTH EVERY DAY .  Magnesium 500 MG TABS, Take by mouth. daily  Medical History:  Past Medical History:  Diagnosis Date  . Cancer ([place])   . COPD (chronic obstructive pulmonary disease) ([place])   . Emphysema of lung ([place])   . History of bilateral inguinal hernia repair 05/11/2009  . Hyperlipidemia   . Hypertension   . Hypothyroid   . Other abnormal glucose     Allergies:  No Known Allergies   Review of Systems:  Review of Systems  Constitutional: Negative for chills, fever and malaise/fatigue.  HENT: Negative for congestion, ear pain and sore throat.   Eyes: Negative.   Respiratory: Negative for cough, shortness of breath and wheezing.   Cardiovascular: Negative for chest pain, palpitations and leg swelling.  Gastrointestinal: Negative for abdominal pain, blood in stool, constipation, diarrhea, heartburn and melena.  Genitourinary: Negative.   Skin: Negative.   Neurological: Negative for dizziness, sensory change, loss of consciousness and headaches.  Psychiatric/Behavioral: Negative for depression. The patient is not nervous/anxious and does not have insomnia.     Family history- Review and unchanged  Social history- Review and unchanged  Physical Exam: No vitals taken by the patient.  General Appearance:Well sounding, in no apparent distress.  ENT/Mouth: No hoarseness, No cough for duration of visit.  Respiratory: completing full sentences without distress, without audible wheeze Neuro: Awake and oriented X 3,  Psych:  Insight and Judgment appropriate.     Vicie Mutters, PA-C 4:08 PM [facility]

## 2019-08-26 ENCOUNTER — Encounter: Payer: Self-pay | Admitting: Physician Assistant

## 2019-08-26 ENCOUNTER — Ambulatory Visit: Payer: Medicare Other | Admitting: Physician Assistant

## 2019-08-26 ENCOUNTER — Other Ambulatory Visit: Payer: Self-pay

## 2019-08-26 DIAGNOSIS — E039 Hypothyroidism, unspecified: Secondary | ICD-10-CM | POA: Diagnosis not present

## 2019-08-26 DIAGNOSIS — E782 Mixed hyperlipidemia: Secondary | ICD-10-CM

## 2019-08-26 DIAGNOSIS — F3341 Major depressive disorder, recurrent, in partial remission: Secondary | ICD-10-CM

## 2019-08-26 DIAGNOSIS — J701 Chronic and other pulmonary manifestations due to radiation: Secondary | ICD-10-CM

## 2019-08-26 DIAGNOSIS — J449 Chronic obstructive pulmonary disease, unspecified: Secondary | ICD-10-CM

## 2019-08-26 DIAGNOSIS — R7309 Other abnormal glucose: Secondary | ICD-10-CM

## 2019-08-26 DIAGNOSIS — Z85118 Personal history of other malignant neoplasm of bronchus and lung: Secondary | ICD-10-CM

## 2019-08-26 DIAGNOSIS — R0989 Other specified symptoms and signs involving the circulatory and respiratory systems: Secondary | ICD-10-CM

## 2020-03-18 DIAGNOSIS — H2513 Age-related nuclear cataract, bilateral: Secondary | ICD-10-CM | POA: Diagnosis not present

## 2020-03-18 DIAGNOSIS — H25043 Posterior subcapsular polar age-related cataract, bilateral: Secondary | ICD-10-CM | POA: Diagnosis not present

## 2020-03-18 DIAGNOSIS — H524 Presbyopia: Secondary | ICD-10-CM | POA: Diagnosis not present

## 2020-03-18 DIAGNOSIS — H43813 Vitreous degeneration, bilateral: Secondary | ICD-10-CM | POA: Diagnosis not present

## 2020-03-23 ENCOUNTER — Other Ambulatory Visit: Payer: Self-pay | Admitting: Adult Health

## 2020-04-23 DIAGNOSIS — H25041 Posterior subcapsular polar age-related cataract, right eye: Secondary | ICD-10-CM | POA: Diagnosis not present

## 2020-04-23 DIAGNOSIS — H25011 Cortical age-related cataract, right eye: Secondary | ICD-10-CM | POA: Diagnosis not present

## 2020-04-23 DIAGNOSIS — H2511 Age-related nuclear cataract, right eye: Secondary | ICD-10-CM | POA: Diagnosis not present

## 2020-04-23 DIAGNOSIS — H25811 Combined forms of age-related cataract, right eye: Secondary | ICD-10-CM | POA: Diagnosis not present

## 2020-05-07 DIAGNOSIS — H25042 Posterior subcapsular polar age-related cataract, left eye: Secondary | ICD-10-CM | POA: Diagnosis not present

## 2020-05-07 DIAGNOSIS — H2512 Age-related nuclear cataract, left eye: Secondary | ICD-10-CM | POA: Diagnosis not present

## 2020-05-07 DIAGNOSIS — H25812 Combined forms of age-related cataract, left eye: Secondary | ICD-10-CM | POA: Diagnosis not present

## 2020-05-30 ENCOUNTER — Other Ambulatory Visit: Payer: Self-pay | Admitting: Internal Medicine

## 2020-05-30 DIAGNOSIS — J4520 Mild intermittent asthma, uncomplicated: Secondary | ICD-10-CM

## 2020-06-05 DIAGNOSIS — Z961 Presence of intraocular lens: Secondary | ICD-10-CM | POA: Diagnosis not present

## 2020-06-14 ENCOUNTER — Other Ambulatory Visit: Payer: Self-pay | Admitting: Internal Medicine

## 2020-06-16 ENCOUNTER — Other Ambulatory Visit: Payer: Self-pay | Admitting: Internal Medicine

## 2020-06-21 ENCOUNTER — Encounter: Payer: Self-pay | Admitting: Internal Medicine

## 2020-06-21 NOTE — Patient Instructions (Signed)

## 2020-06-21 NOTE — Progress Notes (Signed)
Annual  Screening/Preventative Visit  & Comprehensive Evaluation & Examination     This very nice 74 y.o.  MWM presents for a Screening /Preventative Visit & comprehensive evaluation and management of multiple medical co-morbidities.  Patient has been followed for HTN, HLD, Prediabetes and Vitamin D Deficiency.      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 He has refused any f/u CXR or CT's since 2017. Prior CXR's have shown COPD by X-Ray.       Patient has been followed since 2012 with labile HTN and  BP's has been controlled.  Today's BP is at goal - 110/78.   Patient also has hx/o pAfib (CHADsVASc = 2)  since age 75 yo occurring  intermittently over the years & refuses to take meds.  Patient denies any cardiac symptoms as chest pain, palpitations, shortness of breath, dizziness or ankle swelling.      Patient's hyperlipidemia is not controlled with diet and he refuses to take meds for cholesterol. Last lipids were not at goal:  Lab Results  Component Value Date   CHOL 217 (H) 10/31/2018   HDL 62 10/31/2018   LDLCALC 130 (H) 10/31/2018   TRIG 133 10/31/2018   CHOLHDL 3.5 10/31/2018       Patient is monitored for glucose intolerance and patient denies reactive hypoglycemic symptoms, visual blurring, diabetic polys or paresthesias. Last A1c was Normal & at goal:  Lab Results  Component Value Date   HGBA1C 5.5 04/19/2018                  Patient was dx'd Hypothyroid in in 2013 and does take meds for same.       Finally, patient has history of Vitamin D Deficiency ("34" /2008&"23" /2016)and last vitamin D was still very low and he refuses to take meds for same:  Lab Results  Component Value Date   VD25OH 34 04/19/2018    Current Outpatient Medications on File Prior to Visit  Medication Sig  . albuterol 1.25 MG/3ML neb  soln TAKE 3 MLS (1.25 MG TOTAL) BY NEB EVERY 6 HRS AS NEEDED   .  albuterol HFA inhaler Use 2 inhalations 15 min apart every 4 hrs to Rescue Asthma  . citalopram  20 MG tablet Take    1 tablet    Daily      for Mood  . finasteride  5 MG tablet Take    1 tablet    Daily      for Prostate  . levothyroxine 125 MCG tablet Take     1 tablet     Daily        . VITAMIN D 1000 UNITS tablet Patient not taking    No Known Allergies   Past Medical History:  Diagnosis Date  . Cancer ([place])   . COPD (chronic obstructive pulmonary disease) ([place])   . Emphysema of lung ([place])   . History of bilateral inguinal hernia repair 05/11/2009  . Hyperlipidemia   . Hypertension   . Hypothyroid   . Other abnormal glucose    Health Maintenance  Topic Date Due  . COVID-19 Vaccine (1) Never done  . INFLUENZA VACCINE  04/05/2020  . TETANUS/TDAP  01/02/2022  . PNA vac Low Risk Adult  Completed  . COLONOSCOPY  Discontinued  . Hepatitis C Screening  Discontinued   Immunization History  Administered Date(s) Administered  . Influenza, High Dose Seasonal PF 07/21/2014,  06/19/2018, 05/23/2019  . Influenza-Unspecified 06/24/2015, 06/07/2017  . Pneumococcal Conjugate-13 06/24/2015  . Pneumococcal Polysaccharide-23 01/03/2012, 08/23/2017  . Td 01/03/2012    Last Colon - Refuses    History reviewed. No pertinent surgical history.    Family History  Problem Relation Age of Onset  . COPD Father   . Kidney failure Father    Social History   Socioeconomic History  . Marital status: Married    Spouse name: Raquel Sarna   . Number of children: 2 sons  Occupational History  . Not on file  Tobacco Use  . Smoking status: Former Smoker    Quit date: 09/05/2004    Years since quitting: 15.8  . Smokeless tobacco: Never Used  Substance and Sexual Activity  . Alcohol use: Yes    Alcohol/week: 3.0 standard drinks    Types: 3 Standard drinks or equivalent per week  . Drug use: Not on file  . Sexual activity: Not on file    ROS Constitutional: Denies fever, chills, weight  loss/gain, headaches, insomnia,  night sweats or change in appetite. Does c/o fatigue. Eyes: Denies redness, blurred vision, diplopia, discharge, itchy or watery eyes.  ENT: Denies discharge, congestion, post nasal drip, epistaxis, sore throat, earache, hearing loss, dental pain, Tinnitus, Vertigo, Sinus pain or snoring.  Cardio: Denies chest pain, palpitations, irregular heartbeat, syncope, dyspnea, diaphoresis, orthopnea, PND, claudication or edema Respiratory: denies cough, dyspnea, DOE, pleurisy, hoarseness, laryngitis or wheezing.  Gastrointestinal: Denies dysphagia, heartburn, reflux, water brash, pain, cramps, nausea, vomiting, bloating, diarrhea, constipation, hematemesis, melena, hematochezia, jaundice or hemorrhoids Genitourinary: Denies dysuria, frequency, urgency, nocturia, hesitancy, discharge, hematuria or flank pain Musculoskeletal: Denies arthralgia, myalgia, stiffness, Jt. Swelling, pain, limp or strain/sprain. Denies Falls. Skin: Denies puritis, rash, hives, warts, acne, eczema or change in skin lesion Neuro: No weakness, tremor, incoordination, spasms, paresthesia or pain Psychiatric: Denies confusion, memory loss or sensory loss. Denies Depression. Endocrine: Denies change in weight, skin, hair change, nocturia, and paresthesia, diabetic polys, visual blurring or hyper / hypo glycemic episodes.  Heme/Lymph: No excessive bleeding, bruising or enlarged lymph nodes.  Physical Exam  BP 110/78   Pulse 64   Temp (!) 97 F (36.1 C)   Resp 16   Ht 5\' 11"  (1.803 m)   Wt 218 lb 9.6 oz (99.2 kg)   SpO2 96%   BMI 30.49 kg/m   General Appearance: Chronically ill appearing  and in no apparent distress.  Eyes: PERRLA, EOMs, conjunctiva no swelling or erythema, normal fundi and vessels. Sinuses: No frontal/maxillary tenderness ENT/Mouth: EACs patent / TMs  nl. Nares clear without erythema, swelling, mucoid exudates. Oral hygiene is good. No erythema, swelling, or exudate. Tongue  normal, non-obstructing. Tonsils not swollen or erythematous. Hearing normal.  Neck: Supple, thyroid not palpable. No bruits, nodes or JVD. Respiratory: Respiratory effort normal.  BS equal and clear bilateral without rales, rhonci, wheezing or stridor. Cardio: Heart sounds are normal with regular rate and rhythm and no murmurs, rubs or gallops. Peripheral pulses are normal and equal bilaterally without edema. No aortic or femoral bruits. Chest: symmetric with normal excursions and percussion.  Abdomen: Soft, with Nl bowel sounds. Nontender, no guarding, rebound, hernias, masses, or organomegaly.  Lymphatics: Non tender without lymphadenopathy.  Musculoskeletal: Full ROM all peripheral extremities, joint stability, 5/5 strength, and normal gait. Skin: Warm and dry without rashes, lesions, cyanosis, clubbing or  ecchymosis.  Neuro: Cranial nerves intact, reflexes equal bilaterally. Normal muscle tone, no cerebellar symptoms. Sensation intact.  Pysch: Alert and oriented X  3 with normal affect, insight and judgment appropriate.   Assessment and Plan  1. Annual Preventative/Screening Exam    2. Labile hypertension  - EKG 12-Lead - [place], RETROPERITNL ABD,  LTD - Urinalysis, Routine w reflex microscopic - Microalbumin / creatinine urine ratio - CBC with Differential/Platelet - COMPLETE METABOLIC PANEL WITH GFR - Magnesium  3. Hyperlipidemia, mixed  - EKG 12-Lead - [place], RETROPERITNL ABD,  LTD - TSH  4. Abnormal glucose  - EKG 12-Lead - [place], RETROPERITNL ABD,  LTD - Hemoglobin A1c - Insulin, random  5. Vitamin D deficiency   6. Hypothyroidism  - TSH  7. Atherosclerosis of native coronary artery of native heart without angina pectoris  - EKG 12-Lead  8. 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 Screening for colorectal cancer  - POC Hemoccult Bld/Stl   11. Screening for ischemic heart disease  - EKG 12-Lead  12. Prostate cancer  screening  - PSA  13. Former smoker  - EKG 12-Lead - [place], RETROPERITNL ABD,  LTD  14. Screening for AAA (aortic abdominal aneurysm)  - [place], RETROPERITNL ABD,  LTD  15. COPD   74. Medication management  - Urinalysis, Routine w reflex microscopic - Microalbumin / creatinine urine ratio - CBC with Differential/Platelet - COMPLETE METABOLIC PANEL WITH GFR - Magnesium - TSH - Hemoglobin A1c - Insulin, random         Patient was counseled in prudent diet, weight control to achieve/maintain BMI less than 25, BP monitoring, regular exercise and medications as discussed.  Discussed med effects and SE's. Routine screening labs and tests as requested with regular follow-up as recommended. Over 40 minutes of exam, counseling, chart review and high complex critical decision making was performed   Kirtland Bouchard, MD

## 2020-06-22 ENCOUNTER — Other Ambulatory Visit: Payer: Self-pay

## 2020-06-22 ENCOUNTER — Ambulatory Visit (INDEPENDENT_AMBULATORY_CARE_PROVIDER_SITE_OTHER): Payer: Medicare Other | Admitting: Internal Medicine

## 2020-06-22 VITALS — BP 110/78 | HR 64 | Temp 97.0°F | Resp 16 | Ht 71.0 in | Wt 218.6 lb

## 2020-06-22 DIAGNOSIS — Z0000 Encounter for general adult medical examination without abnormal findings: Secondary | ICD-10-CM

## 2020-06-22 DIAGNOSIS — Z23 Encounter for immunization: Secondary | ICD-10-CM

## 2020-06-22 DIAGNOSIS — Z1211 Encounter for screening for malignant neoplasm of colon: Secondary | ICD-10-CM

## 2020-06-22 DIAGNOSIS — Z87891 Personal history of nicotine dependence: Secondary | ICD-10-CM | POA: Diagnosis not present

## 2020-06-22 DIAGNOSIS — Z136 Encounter for screening for cardiovascular disorders: Secondary | ICD-10-CM

## 2020-06-22 DIAGNOSIS — E039 Hypothyroidism, unspecified: Secondary | ICD-10-CM

## 2020-06-22 DIAGNOSIS — J701 Chronic and other pulmonary manifestations due to radiation: Secondary | ICD-10-CM

## 2020-06-22 DIAGNOSIS — R0989 Other specified symptoms and signs involving the circulatory and respiratory systems: Secondary | ICD-10-CM

## 2020-06-22 DIAGNOSIS — Z0001 Encounter for general adult medical examination with abnormal findings: Secondary | ICD-10-CM

## 2020-06-22 DIAGNOSIS — E559 Vitamin D deficiency, unspecified: Secondary | ICD-10-CM

## 2020-06-22 DIAGNOSIS — I2510 Atherosclerotic heart disease of native coronary artery without angina pectoris: Secondary | ICD-10-CM | POA: Diagnosis not present

## 2020-06-22 DIAGNOSIS — Z125 Encounter for screening for malignant neoplasm of prostate: Secondary | ICD-10-CM

## 2020-06-22 DIAGNOSIS — E782 Mixed hyperlipidemia: Secondary | ICD-10-CM

## 2020-06-22 DIAGNOSIS — J449 Chronic obstructive pulmonary disease, unspecified: Secondary | ICD-10-CM

## 2020-06-22 DIAGNOSIS — N320 Bladder-neck obstruction: Secondary | ICD-10-CM

## 2020-06-22 DIAGNOSIS — Z79899 Other long term (current) drug therapy: Secondary | ICD-10-CM

## 2020-06-22 DIAGNOSIS — R7309 Other abnormal glucose: Secondary | ICD-10-CM | POA: Diagnosis not present

## 2020-06-23 LAB — CBC WITH DIFFERENTIAL/PLATELET
Absolute Monocytes: 1128 cells/uL — ABNORMAL HIGH (ref 200–950)
Basophils Absolute: 91 cells/uL (ref 0–200)
Basophils Relative: 1 %
Eosinophils Absolute: 182 cells/uL (ref 15–500)
Eosinophils Relative: 2 %
HCT: 46.8 % (ref 38.5–50.0)
Hemoglobin: 15.7 g/dL (ref 13.2–17.1)
Lymphs Abs: 2157 cells/uL (ref 850–3900)
MCH: 32.6 pg (ref 27.0–33.0)
MCHC: 33.5 g/dL (ref 32.0–36.0)
MCV: 97.3 fL (ref 80.0–100.0)
MPV: 9.7 fL (ref 7.5–12.5)
Monocytes Relative: 12.4 %
Neutro Abs: 5542 cells/uL (ref 1500–7800)
Neutrophils Relative %: 60.9 %
Platelets: 238 10*3/uL (ref 140–400)
RBC: 4.81 10*6/uL (ref 4.20–5.80)
RDW: 12.1 % (ref 11.0–15.0)
Total Lymphocyte: 23.7 %
WBC: 9.1 10*3/uL (ref 3.8–10.8)

## 2020-06-23 LAB — COMPLETE METABOLIC PANEL WITH GFR
AG Ratio: 1.4 (calc) (ref 1.0–2.5)
ALT: 15 U/L (ref 9–46)
AST: 23 U/L (ref 10–35)
Albumin: 4 g/dL (ref 3.6–5.1)
Alkaline phosphatase (APISO): 72 U/L (ref 35–144)
BUN/Creatinine Ratio: 11 (calc) (ref 6–22)
BUN: 15 mg/dL (ref 7–25)
CO2: 24 mmol/L (ref 20–32)
Calcium: 9.3 mg/dL (ref 8.6–10.3)
Chloride: 104 mmol/L (ref 98–110)
Creat: 1.31 mg/dL — ABNORMAL HIGH (ref 0.70–1.18)
GFR, Est African American: 63 mL/min/{1.73_m2} (ref >=60)
GFR, Est Non African American: 54 mL/min/{1.73_m2} — ABNORMAL LOW (ref >=60)
Globulin: 2.8 g/dL (calc) (ref 1.9–3.7)
Glucose, Bld: 96 mg/dL (ref 65–99)
Potassium: 3.9 mmol/L (ref 3.5–5.3)
Sodium: 139 mmol/L (ref 135–146)
Total Bilirubin: 0.3 mg/dL (ref 0.2–1.2)
Total Protein: 6.8 g/dL (ref 6.1–8.1)

## 2020-06-23 LAB — URINALYSIS, ROUTINE W REFLEX MICROSCOPIC
Bilirubin Urine: NEGATIVE
Glucose, UA: NEGATIVE
Hgb urine dipstick: NEGATIVE
Ketones, ur: NEGATIVE
Leukocytes,Ua: NEGATIVE
Nitrite: NEGATIVE
Protein, ur: NEGATIVE
Specific Gravity, Urine: 1.017 (ref 1.001–1.03)
pH: 5.5 (ref 5.0–8.0)

## 2020-06-23 LAB — INSULIN, RANDOM: Insulin: 8.7 u[IU]/mL

## 2020-06-23 LAB — TSH: TSH: 2.86 mIU/L (ref 0.40–4.50)

## 2020-06-23 LAB — HEMOGLOBIN A1C
Hgb A1c MFr Bld: 5.4 % of total Hgb (ref <5.7)
Mean Plasma Glucose: 108 (calc)
eAG (mmol/L): 6 (calc)

## 2020-06-23 LAB — MAGNESIUM: Magnesium: 2 mg/dL (ref 1.5–2.5)

## 2020-06-23 LAB — MICROALBUMIN / CREATININE URINE RATIO
Creatinine, Urine: 139 mg/dL (ref 20–320)
Microalb Creat Ratio: 41 mcg/mg creat — ABNORMAL HIGH (ref <30)
Microalb, Ur: 5.7 mg/dL

## 2020-06-23 LAB — PSA: PSA: 0.21 ng/mL (ref <=4.0)

## 2020-06-23 NOTE — Progress Notes (Signed)
========================================================== -   Test results slightly outside the reference range are not unusual. If there is anything important, I will review this with you,  otherwise it is considered normal test values.  If you have further questions,  please do not hesitate to contact me at the office or via My Chart.  ==========================================================  -  PSA - Very Low - Great  ==========================================================  -  A1c - Remains Normal - Great  - No Diabetes  ! ==========================================================  -  All Else - CBC - Kidneys - Electrolytes - Liver - Magnesium & Thyroid    - all  Normal / OK ==========================================================   - Keep up the [place] Work  ! ==========================================================

## 2020-08-14 ENCOUNTER — Other Ambulatory Visit: Payer: Self-pay | Admitting: Adult Health

## 2020-08-14 DIAGNOSIS — J4520 Mild intermittent asthma, uncomplicated: Secondary | ICD-10-CM

## 2020-09-09 ENCOUNTER — Other Ambulatory Visit: Payer: Self-pay | Admitting: Internal Medicine

## 2020-10-01 ENCOUNTER — Other Ambulatory Visit: Payer: Self-pay | Admitting: Internal Medicine

## 2020-10-02 ENCOUNTER — Encounter (HOSPITAL_COMMUNITY): Payer: Self-pay

## 2020-10-02 ENCOUNTER — Emergency Department (HOSPITAL_COMMUNITY): Payer: Medicare Other

## 2020-10-02 ENCOUNTER — Emergency Department (HOSPITAL_COMMUNITY)
Admission: EM | Admit: 2020-10-02 | Discharge: 2020-10-03 | Disposition: A | Discharge status: Home / Self Care | Payer: Medicare Other | Attending: Emergency Medicine | Admitting: Emergency Medicine

## 2020-10-02 ENCOUNTER — Other Ambulatory Visit: Payer: Self-pay

## 2020-10-02 DIAGNOSIS — S52022A Displaced fracture of olecranon process without intraarticular extension of left ulna, initial encounter for closed fracture: Secondary | ICD-10-CM | POA: Diagnosis not present

## 2020-10-02 DIAGNOSIS — R52 Pain, unspecified: Secondary | ICD-10-CM | POA: Diagnosis not present

## 2020-10-02 DIAGNOSIS — I10 Essential (primary) hypertension: Secondary | ICD-10-CM | POA: Insufficient documentation

## 2020-10-02 DIAGNOSIS — E039 Hypothyroidism, unspecified: Secondary | ICD-10-CM | POA: Diagnosis not present

## 2020-10-02 DIAGNOSIS — Z79899 Other long term (current) drug therapy: Secondary | ICD-10-CM | POA: Diagnosis not present

## 2020-10-02 DIAGNOSIS — Y92009 Unspecified place in unspecified non-institutional (private) residence as the place of occurrence of the external cause: Secondary | ICD-10-CM | POA: Insufficient documentation

## 2020-10-02 DIAGNOSIS — S0101XA Laceration without foreign body of scalp, initial encounter: Secondary | ICD-10-CM | POA: Insufficient documentation

## 2020-10-02 DIAGNOSIS — J320 Chronic maxillary sinusitis: Secondary | ICD-10-CM | POA: Diagnosis not present

## 2020-10-02 DIAGNOSIS — J3489 Other specified disorders of nose and nasal sinuses: Secondary | ICD-10-CM | POA: Diagnosis not present

## 2020-10-02 DIAGNOSIS — W108XXA Fall (on) (from) other stairs and steps, initial encounter: Secondary | ICD-10-CM | POA: Diagnosis not present

## 2020-10-02 DIAGNOSIS — J449 Chronic obstructive pulmonary disease, unspecified: Secondary | ICD-10-CM | POA: Diagnosis not present

## 2020-10-02 DIAGNOSIS — Y9301 Activity, walking, marching and hiking: Secondary | ICD-10-CM | POA: Insufficient documentation

## 2020-10-02 DIAGNOSIS — I6523 Occlusion and stenosis of bilateral carotid arteries: Secondary | ICD-10-CM | POA: Diagnosis not present

## 2020-10-02 DIAGNOSIS — W19XXXA Unspecified fall, initial encounter: Secondary | ICD-10-CM | POA: Diagnosis not present

## 2020-10-02 DIAGNOSIS — Z85118 Personal history of other malignant neoplasm of bronchus and lung: Secondary | ICD-10-CM | POA: Insufficient documentation

## 2020-10-02 DIAGNOSIS — Z87891 Personal history of nicotine dependence: Secondary | ICD-10-CM | POA: Insufficient documentation

## 2020-10-02 DIAGNOSIS — S0990XA Unspecified injury of head, initial encounter: Secondary | ICD-10-CM | POA: Diagnosis not present

## 2020-10-02 DIAGNOSIS — Z7951 Long term (current) use of inhaled steroids: Secondary | ICD-10-CM | POA: Diagnosis not present

## 2020-10-02 DIAGNOSIS — R55 Syncope and collapse: Secondary | ICD-10-CM | POA: Diagnosis not present

## 2020-10-02 LAB — BASIC METABOLIC PANEL
Anion gap: 9 (ref 5–15)
BUN: 12 mg/dL (ref 8–23)
CO2: 25 mmol/L (ref 22–32)
Calcium: 8.8 mg/dL — ABNORMAL LOW (ref 8.9–10.3)
Chloride: 104 mmol/L (ref 98–111)
Creatinine, Ser: 1.23 mg/dL (ref 0.61–1.24)
GFR, Estimated: >60 mL/min (ref >60)
Glucose, Bld: 136 mg/dL — ABNORMAL HIGH (ref 70–99)
Potassium: 4.6 mmol/L (ref 3.5–5.1)
Sodium: 138 mmol/L (ref 135–145)

## 2020-10-02 LAB — CBC WITH DIFFERENTIAL/PLATELET
Abs Immature Granulocytes: 0.05 10*3/uL (ref 0.00–0.07)
Basophils Absolute: 0.1 10*3/uL (ref 0.0–0.1)
Basophils Relative: 1 %
Eosinophils Absolute: 0.2 10*3/uL (ref 0.0–0.5)
Eosinophils Relative: 2 %
HCT: 43 % (ref 39.0–52.0)
Hemoglobin: 14.1 g/dL (ref 13.0–17.0)
Immature Granulocytes: 1 %
Lymphocytes Relative: 17 %
Lymphs Abs: 1.7 10*3/uL (ref 0.7–4.0)
MCH: 32.6 pg (ref 26.0–34.0)
MCHC: 32.8 g/dL (ref 30.0–36.0)
MCV: 99.3 fL (ref 80.0–100.0)
Monocytes Absolute: 1 10*3/uL (ref 0.1–1.0)
Monocytes Relative: 10 %
Neutro Abs: 6.7 10*3/uL (ref 1.7–7.7)
Neutrophils Relative %: 69 %
Platelets: 206 10*3/uL (ref 150–400)
RBC: 4.33 MIL/uL (ref 4.22–5.81)
RDW: 12.8 % (ref 11.5–15.5)
WBC: 9.7 10*3/uL (ref 4.0–10.5)
nRBC: 0 % (ref 0.0–0.2)

## 2020-10-02 MED ORDER — LIDOCAINE HCL (PF) 1 % IJ SOLN
30.0000 mL | Freq: Once | INTRAMUSCULAR | Status: AC
  Administered 2020-10-02: 30 mL via INTRADERMAL
  Filled 2020-10-02: qty 30

## 2020-10-02 NOTE — ED Notes (Signed)
Suture cart placed at bedside. 

## 2020-10-02 NOTE — ED Notes (Signed)
Pt transported to radiology.

## 2020-10-02 NOTE — ED Triage Notes (Signed)
EMS reports pt is from home. Had a syncopal episode while ambulating to bedroom. Laceration to forehead. No blood thinners. Heart rhythm atrial flutter. Pt reports a history of a flutter. BP - 150/90, HR - 70, O2 sat 99% RA, CBG- 108.

## 2020-10-02 NOTE — ED Provider Notes (Signed)
Medical screening examination/treatment/procedure(s) were conducted as a shared visit with non-physician practitioner(s) and myself.  I personally evaluated the patient during the encounter.  Clinical Impression:   Final diagnoses:  Laceration of scalp without foreign body, initial encounter  Olecranon fracture, left, closed, initial encounter   I directly supervised the lac repair  Patient is a 75 year old male, he is prone to getting lightheaded when he stands, postural hypotension.  States that he went upstairs, became lightheaded and fell striking his head.  Feels like he lost consciousness.  On exam he has a large linear laceration up the middle of his forehead extending from the level of the eyebrows all the way up into the hairline.  This is linear, nonbleeding at this time.  Cranial nerves III through XII are intact, pupils are normal, extraocular movements are normal, able to move all 4 extremities and follows commands normal memory.  He has some slight cervical spine tenderness, immobilized in a cervical collar, imaging, primary repair, patient otherwise appears well anticipate discharge if all negative.   Noemi Chapel, MD 10/04/20 [phone]

## 2020-10-02 NOTE — ED Notes (Signed)
Baird Cancer, PA at bedside to perform laceration repair.

## 2020-10-02 NOTE — ED Notes (Signed)
C-collar removed by provider

## 2020-10-02 NOTE — ED Provider Notes (Signed)
[facility] EMERGENCY DEPARTMENT Provider Note   CSN: [id number] Arrival date & time: 10/02/20  2147     History Chief Complaint  Patient presents with  . Loss of Consciousness  . Head Laceration    Christopher Peck is a 75 y.o. male.  The history is provided by the patient and medical records.  Loss of Consciousness Head Laceration    75 y.o. M with hx of cancer, COPD, HLP, HTN, presenting to the ED for syncopal event.  Was walking upstairs in his house and got lightheaded and fell.  He thinks there was LOC.  He state she has a history of same due to postural hypotension.  He fell onto hardwood floor, sustained laceration to forehead.  He also reports pain of left elbow from the fall.  He has been ambulatory since fall without issue.  Denies hip or back pain.  He is not currently on anticoagulation.  States otherwise today he felt fine, no chest pain, SOB, abdominal pain, palpitations, nausea, vomiting, etc.  Tetanus vaccine is UTD.  Past Medical History:  Diagnosis Date  . Cancer (Christopher Peck)   . COPD (chronic obstructive pulmonary disease) (Christopher Peck)   . Emphysema of lung (Christopher Peck)   . History of bilateral inguinal hernia repair 05/11/2009  . Hyperlipidemia   . Hypertension   . Hypothyroid   . Other abnormal glucose     Patient Active Problem List   Diagnosis Date Noted  . Overweight (BMI 25.0-29.9) 07/24/2018  . Abnormal glucose 04/19/2018  . Depression, major, recurrent, in partial remission (Christopher Peck) 08/23/2017  . Labile hypertension 01/15/2014  . Vitamin D deficiency 01/15/2014  . Personal history of lung cancer 01/15/2014  . Hyperlipidemia, mixed 01/15/2014  . Hypothyroidism 01/15/2014  . COPD 01/15/2014  . BPH/Prostatism 01/15/2014  . 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 CORONARY ATHEROSCLEROSIS NATIVE CORONARY ARTERY 05/11/2009    Past Surgical History:  Procedure Laterality Date  . HERNIA REPAIR         Family History  Problem Relation Age  of Onset  . COPD Father   . Kidney failure Father     Social History   Tobacco Use  . Smoking status: Former Smoker    Quit date: 09/05/2004    Years since quitting: 16.0  . Smokeless tobacco: Never Used  Substance Use Topics  . Alcohol use: Yes    Alcohol/week: 3.0 standard drinks    Types: 3 Standard drinks or equivalent per week    Home Medications Prior to Admission medications   Medication Sig Start Date End Date Taking? Authorizing Provider  albuterol (ACCUNEB) 1.25 MG/3ML nebulizer solution USE 1 VIAL VIA NEBULIZER EVERY 6 HOURS AS NEEDED FOR WHEEZING 08/14/20   [name], [name], NP  albuterol (VENTOLIN HFA) 108 (90 Base) MCG/ACT inhaler Use     2 inhalations    15 minutes apart    every 4 hours     to [facility] 05/30/20   Unk Pinto, MD  cholecalciferol (VITAMIN D) 1000 UNITS tablet Take 1,000 Units by mouth daily. Patient not taking: Reported on 06/22/2020    [provider]  citalopram (CELEXA) 20 MG tablet TAKE 1 TABLET BY MOUTH DAILY FOR MOOD 09/09/20   [name], NP  finasteride (PROSCAR) 5 MG tablet TAKE 1 TABLET BY MOUTH EVERY DAY 09/09/20   [name], NP  levothyroxine (SYNTHROID) 125 MCG tablet Take  1 tablet  Daily on an empty stomach with only water for 30 minutes & no  Antacid meds, Calcium or Magnesium for 4 hours & avoid Biotin 10/01/20   Unk Pinto, MD    Allergies    Patient has no known allergies.  Review of Systems   Review of Systems  Cardiovascular: Positive for syncope.  Skin: Positive for wound.  All other systems reviewed and are negative.   Physical Exam Updated Vital Signs BP (!) 146/86 (BP Location: Right Arm)   Pulse 70   Temp 99 F (37.2 C) (Oral)   Resp 19   Ht 5\' 9"  (1.753 m)   Wt 99.8 kg   SpO2 98%   BMI 32.49 kg/m   Physical Exam Vitals and nursing note reviewed.  Constitutional:      Appearance: He is well-developed and well-nourished.  HENT:     Head: Normocephalic and atraumatic.      Comments: Large linear laceration to middle of forehead, no active bleeding at present, no hematoma or skull depression noted    Mouth/Throat:     Mouth: Oropharynx is clear and moist.  Eyes:     Extraocular Movements: EOM normal.     Conjunctiva/sclera: Conjunctivae normal.     Pupils: Pupils are equal, round, and reactive to light.  Cardiovascular:     Rate and Rhythm: Normal rate and regular rhythm.     Heart sounds: Normal heart sounds.  Pulmonary:     Effort: Pulmonary effort is normal. No respiratory distress.     Breath sounds: Normal breath sounds. No rhonchi.  Abdominal:     General: Bowel sounds are normal.     Palpations: Abdomen is soft.     Tenderness: There is no abdominal tenderness. There is no rebound.  Musculoskeletal:        General: Normal range of motion.     Cervical back: Normal range of motion.     Comments: Contusion noted to left elbow, pain elicited with ROM, radial pulse intact, moving fingers normally  Skin:    General: Skin is warm and dry.  Neurological:     Mental Status: He is alert and oriented to person, place, and time.     Comments: AAOx3, answering questions and following commands appropriately; equal strength UE and LE bilaterally; CN grossly intact; moves all extremities appropriately without ataxia; no focal neuro deficits or facial asymmetry appreciated  Psychiatric:        Mood and Affect: Mood and affect normal.     ED Results / Procedures / Treatments   Labs (all labs ordered are listed, but only abnormal results are displayed) Labs Reviewed  BASIC METABOLIC PANEL - Abnormal; Notable for the following components:      Result Value   Glucose, Bld 136 (*)    Calcium 8.8 (*)    All other components within normal limits  CBC WITH DIFFERENTIAL/PLATELET  CBG MONITORING, ED    EKG None  Radiology DG Elbow Complete Left  Result Date: 10/02/2020 CLINICAL DATA:  Syncope EXAM: LEFT ELBOW - COMPLETE 3+ VIEW COMPARISON:  None.  FINDINGS: Fracture noted through the base of the olecranon process with displacement of the olecranon. Associated joint effusion. No subluxation or dislocation. IMPRESSION: Displaced olecranon fracture. Electronically Signed   By: Rolm Baptise M.D.   On: 10/02/2020 22:51   CT Head Wo Contrast  Result Date: 10/02/2020 CLINICAL DATA:  Syncope, large head laceration. EXAM: CT HEAD WITHOUT CONTRAST CT CERVICAL SPINE WITHOUT CONTRAST TECHNIQUE: Multidetector CT imaging of the head and cervical spine was performed following the standard protocol without intravenous  contrast. Multiplanar CT image reconstructions of the cervical spine were also generated. COMPARISON:  MR head 05/11/2005, CT chest 02/12/2016 FINDINGS: CT HEAD FINDINGS Brain: No pneumocephalus. No evidence of large-territorial acute infarction. No parenchymal hemorrhage. No mass lesion. No extra-axial collection. No mass effect or midline shift. No hydrocephalus. Basilar cisterns are patent. Vascular: No hyperdense vessel. Skull: No acute fracture or focal lesion. No definite acute displaced lamina per pre she a fracture orbital floor fracture. Curvilinear density along bilateral orbits is symmetric and consistent with benign calcifications (4:38, 39). Calcification above the left cribiform plate of unclear etiology (5:16). Sinuses/Orbits: Almost complete opacification of the right maxillary sinus with associated hyperdensity as well as hyperostosis of the maxillary sinus wall. Paranasal sinuses and mastoid air cells are clear. The orbits are unremarkable. Other: Large frontal midline scalp laceration with associated subcutaneus soft tissue edema and emphysema. No definite subgaleal hematoma. Subcutaneus soft tissue emphysema tracks inferiorly along the nodes. CT CERVICAL SPINE FINDINGS Alignment: Normal. Skull base and vertebrae: No acute fracture. No aggressive appearing focal osseous lesion or focal pathologic process. Soft tissues and spinal canal: No  prevertebral fluid or swelling. No visible canal hematoma. Disc levels:  Maintained. Upper chest: Biapical pleural/pulmonary scarring. Similar subpleural right apical 5 mm nodule. Similar scattered right apical pulmonary micronodules. Similar scattered left apical pulmonary micronodules. Scarring at the left apex. Other: Calcifications of the carotid arteries within the neck. ATrophy versus surgical removal of the right parotid gland. IMPRESSION: 1. No acute intracranial abnormality. 2. Large frontal midline scalp laceration with no associated hematoma formation or underlying calvarial fracture. 3. No acute displaced fracture or traumatic listhesis of the cervical spine. 4. 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 Correlate with signs symptoms for a superimposed acute sinusitis. Electronically Signed   By: Iven Finn M.D.   On: 10/02/2020 23:39   CT Cervical Spine Wo Contrast  Result Date: 10/02/2020 CLINICAL DATA:  Syncope, large head laceration. EXAM: CT HEAD WITHOUT CONTRAST CT CERVICAL SPINE WITHOUT CONTRAST TECHNIQUE: Multidetector CT imaging of the head and cervical spine was performed following the standard protocol without intravenous contrast. Multiplanar CT image reconstructions of the cervical spine were also generated. COMPARISON:  MR head 05/11/2005, CT chest 02/12/2016 FINDINGS: CT HEAD FINDINGS Brain: No pneumocephalus. No evidence of large-territorial acute infarction. No parenchymal hemorrhage. No mass lesion. No extra-axial collection. No mass effect or midline shift. No hydrocephalus. Basilar cisterns are patent. Vascular: No hyperdense vessel. Skull: No acute fracture or focal lesion. No definite acute displaced lamina per pre she a fracture orbital floor fracture. Curvilinear density along bilateral orbits is symmetric and consistent with benign calcifications (4:38, 39). Calcification above the left cribiform plate of unclear etiology (5:16).  Sinuses/Orbits: Almost complete opacification of the right maxillary sinus with associated hyperdensity as well as hyperostosis of the maxillary sinus wall. Paranasal sinuses and mastoid air cells are clear. The orbits are unremarkable. Other: Large frontal midline scalp laceration with associated subcutaneus soft tissue edema and emphysema. No definite subgaleal hematoma. Subcutaneus soft tissue emphysema tracks inferiorly along the nodes. CT CERVICAL SPINE FINDINGS Alignment: Normal. Skull base and vertebrae: No acute fracture. No aggressive appearing focal osseous lesion or focal pathologic process. Soft tissues and spinal canal: No prevertebral fluid or swelling. No visible canal hematoma. Disc levels:  Maintained. Upper chest: Biapical pleural/pulmonary scarring. Similar subpleural right apical 5 mm nodule. Similar scattered right apical pulmonary micronodules. Similar scattered left apical pulmonary micronodules. Scarring at the left apex. Other: Calcifications of the carotid  arteries within the neck. ATrophy versus surgical removal of the right parotid gland. IMPRESSION: 1. No acute intracranial abnormality. 2. Large frontal midline scalp laceration with no associated hematoma formation or underlying calvarial fracture. 3. No acute displaced fracture or traumatic listhesis of the cervical spine. 4. 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 Correlate with signs symptoms for a superimposed acute sinusitis. Electronically Signed   By: Iven Finn M.D.   On: 10/02/2020 23:39   DG Chest Port 1 View  Result Date: 10/02/2020 CLINICAL DATA:  Syncope EXAM: PORTABLE CHEST 1 VIEW COMPARISON:  03/17/2016 FINDINGS: Abnormal soft tissue in the left suprahilar region, likely radiation fibrosis, stable since prior study. Stable chronic elevation of the left hemidiaphragm. No acute confluent opacities. Heart is normal size. No effusions or acute bony abnormality. IMPRESSION: Stable  radiation fibrosis in the left suprahilar region and volume loss in the left lung. No active disease. Electronically Signed   By: Rolm Baptise M.D.   On: 10/02/2020 22:50    Procedures Procedures   LACERATION REPAIR Performed by: Larene Pickett Authorized by: Larene Pickett Consent: Verbal consent obtained. Risks and benefits: risks, benefits and alternatives were discussed Consent given by: patient Patient identity confirmed: provided demographic data Prepped and Draped in normal sterile fashion Wound explored  Laceration Location: forhead  Laceration Length: 12cm  No Foreign Bodies seen or palpated  Anesthesia: local infiltration  Local anesthetic: lidocaine 1% without epinephrine  Anesthetic total: 8 ml  Irrigation method: syringe Amount of cleaning: standard  Skin closure: 5-0 prolene  Number of sutures: 11  Technique: simple interrupted  Patient tolerance: Patient tolerated the procedure well with no immediate complications.  Medications Ordered in ED Medications  lidocaine (PF) (XYLOCAINE) 1 % injection 30 mL (30 mLs Intradermal Given 10/02/20 2230)    ED Course  I have reviewed the triage vital signs and the nursing notes.  Pertinent labs & imaging results that were available during my care of the patient were reviewed by me and considered in my medical decision making (see chart for details).    MDM Rules/Calculators/A&P  75 year old male presenting to the ED after syncopal event.  He reports history of same due to orthostatic hypotension.  Reports he walked up the steps, fell forward face first into the door.  He thinks there was full loss of consciousness.  Landed on hardwood floor.  Does have large laceration to the forehead.  He is awake, alert, appropriately oriented.  He is not currently on anticoagulation.  Also complains of left elbow pain and does have contusion and some swelling.  His arm is neurovascularly intact.  Denies any hip or back pain.   Placed in c-collar, will obtain CT of the head neck as well as films of elbow and chest.  Labs pending.  CT head/c-spine negative.  Chest films with post-radiation changes, no acute findings.  Elbow films with olecranon fracture.  Labs reassuring.  Tetanus UTD.  Laceration repaired as above.  Will place in long arm splint with sling.  Will refer to PCP for suture removal and will see orthopedics for elbow.  Patient has refused pain medications here, states they messed up his body when going through his cancer treatment and is trying to avoid them.  He was given small supply of hydrocodone to use if pain is severe, states he will try to stay with tylenol unless necessary.  He will arrange follow-up as outlined above. Return here for new concerns.  Final Clinical Impression(s) /  ED Diagnoses Final diagnoses:  Laceration of scalp without foreign body, initial encounter  Olecranon fracture, left, closed, initial encounter    Rx / DC Orders ED Discharge Orders         Ordered    HYDROcodone-acetaminophen (NORCO/VICODIN) 5-325 MG tablet  Every 4 hours PRN        10/03/20 0049           Larene Pickett, PA-C 10/03/20 [id number]    Noemi Chapel, MD 10/04/20 1453

## 2020-10-03 MED ORDER — HYDROCODONE-ACETAMINOPHEN 5-325 MG PO TABS: 1.0000 | ORAL_TABLET | ORAL | 0 refills | Status: DC | PRN

## 2020-10-03 NOTE — Discharge Instructions (Signed)
Take the prescribed medication as directed.  If you do not feel you need something this strong, can rely on tylenol or motrin. Follow-up with your primary care doctor to have sutures removed in 7-10 days.   Follow-up with orthopedics about elbow-- call Monday to get this scheduled. Return to the ED for new or worsening symptoms.

## 2020-10-03 NOTE — Progress Notes (Signed)
Orthopedic Tech Progress Note Patient Details:  Christopher Peck [birth year] [id number]  Ortho Devices Type of Ortho Device: Post (long arm) splint,Arm sling Ortho Device/Splint Location: LUE Ortho Device/Splint Interventions: Ordered,Application,Adjustment   Post Interventions Patient Tolerated: Well Instructions Provided: Care of device,Poper ambulation with device,Adjustment of device   [name] 10/03/2020, 1:13 AM

## 2020-10-06 DIAGNOSIS — S52022A Displaced fracture of olecranon process without intraarticular extension of left ulna, initial encounter for closed fracture: Secondary | ICD-10-CM | POA: Diagnosis not present

## 2020-10-13 ENCOUNTER — Ambulatory Visit (INDEPENDENT_AMBULATORY_CARE_PROVIDER_SITE_OTHER): Payer: Medicare Other | Admitting: Internal Medicine

## 2020-10-13 ENCOUNTER — Other Ambulatory Visit: Payer: Self-pay

## 2020-10-13 VITALS — BP 110/66 | HR 68 | Temp 97.4°F | Resp 16 | Ht 71.0 in | Wt 218.0 lb

## 2020-10-13 DIAGNOSIS — I951 Orthostatic hypotension: Secondary | ICD-10-CM

## 2020-10-13 DIAGNOSIS — S0181XD Laceration without foreign body of other part of head, subsequent encounter: Secondary | ICD-10-CM

## 2020-10-13 MED ORDER — FLUDROCORTISONE ACETATE 0.1 MG PO TABS: 0.1000 mg | ORAL_TABLET | Freq: Every day | ORAL | 0 refills | Status: DC

## 2020-10-13 NOTE — Patient Instructions (Addendum)
Orthostatic Hypotension  Sitting     BP 126/74   P 77        &               Standing    BP 83/46    P 57   Blood pressure is a measurement of how strongly, or weakly, your blood is pressing against the walls of your arteries. Orthostatic hypotension is a sudden drop in blood pressure that happens when you quickly change positions, such as when you get up from sitting or lying down. Arteries are blood vessels that carry blood from your heart throughout your body. When blood pressure is too low, you may not get enough blood to your brain or to the rest of your organs. This can cause weakness, light-headedness, rapid heartbeat, and fainting. This can last for just a few seconds or for up to a few minutes. Orthostatic hypotension is usually not a serious problem. However, if it happens frequently or gets worse, it may be a sign of something more serious. What are the causes? This condition may be caused by:  Sudden changes in posture, such as standing up quickly after you have been sitting or lying down.  Blood loss.  Loss of body fluids (dehydration).  Heart problems.  Hormone (endocrine) problems.  Pregnancy.  Severe infection.  Lack of certain nutrients.  Severe allergic reactions (anaphylaxis).  Certain medicines, such as blood pressure medicine or medicines that make the body lose excess fluids (diuretics). Sometimes, this condition can be caused by not taking medicine as directed, such as taking too much of a certain medicine. What increases the risk? The following factors may make you more likely to develop this condition:  Age. Risk increases as you get older.  Conditions that affect the heart or the central nervous system.  Taking certain medicines, such as blood pressure medicine or diuretics.  Being pregnant. What are the signs or symptoms? Symptoms of this condition may include:  Weakness.  Light-headedness.  Dizziness.  Blurred vision.  Fatigue.  Rapid  heartbeat.  Fainting, in severe cases. How is this diagnosed? This condition is diagnosed based on:  Your medical history.  Your symptoms.  Your blood pressure measurement. Your health care provider will check your blood pressure when you are: ? Lying down. ? Sitting. ? Standing. A blood pressure reading is recorded as two numbers, such as "120 over 80" (or 120/80). The first ("top") number is called the systolic pressure. It is a measure of the pressure in your arteries as your heart beats. The second ("bottom") number is called the diastolic pressure. It is a measure of the pressure in your arteries when your heart relaxes between beats. Blood pressure is measured in a unit called mm Hg. Healthy blood pressure for most adults is 120/80. If your blood pressure is below 90/60, you may be diagnosed with hypotension. Other information or tests that may be used to diagnose orthostatic hypotension include:  Your other vital signs, such as your heart rate and temperature.  Blood tests.  Tilt table test. For this test, you will be safely secured to a table that moves you from a lying position to an upright position. Your heart rhythm and blood pressure will be monitored during the test. How is this treated? This condition may be treated by:  Changing your diet. This may involve eating more salt (sodium) or drinking more water.  Taking medicines to raise your blood pressure.  Changing the dosage of certain  medicines you are taking that might be lowering your blood pressure.  Wearing compression stockings. These stockings help to prevent blood clots and reduce swelling in your legs. In some cases, you may need to go to the hospital for:  Fluid replacement. This means you will receive fluids through an IV.  Blood replacement. This means you will receive donated blood through an IV (transfusion).  Treating an infection or heart problems, if this applies.  Monitoring. You may need to be  monitored while medicines that you are taking wear off. Follow these instructions at home: Eating and drinking  Drink enough fluid to keep your urine pale yellow.  Eat a healthy diet, and follow instructions from your health care provider about eating or drinking restrictions. A healthy diet includes: ? Fresh fruits and vegetables. ? Whole grains. ? Lean meats. ? Low-fat dairy products.  Eat extra salt only as directed. Do not add extra salt to your diet unless your health care provider told you to do that.  Eat frequent, small meals.  Avoid standing up suddenly after eating.   Medicines  Take over-the-counter and prescription medicines only as told by your health care provider. ? Follow instructions from your health care provider about changing the dosage of your current medicines, if this applies. ? Do not stop or adjust any of your medicines on your own. General instructions  Wear compression stockings as told by your health care provider.  Get up slowly from lying down or sitting positions. This gives your blood pressure a chance to adjust.  Avoid hot showers and excessive heat as directed by your health care provider.  Return to your normal activities as told by your health care provider. Ask your health care provider what activities are safe for you.  Do not use any products that contain nicotine or tobacco, such as cigarettes, e-cigarettes, and chewing tobacco. If you need help quitting, ask your health care provider.  Keep all follow-up visits as told by your health care provider. This is important.   Contact a health care provider if you:  Vomit.  Have diarrhea.  Have a fever for more than 2-3 days.  Feel more thirsty than usual.  Feel weak and tired. Get help right away if you:  Have chest pain.  Have a fast or irregular heartbeat.  Develop numbness in any part of your body.  Cannot move your arms or your legs.  Have trouble speaking.  Become sweaty  or feel light-headed.  Faint.  Feel short of breath.  Have trouble staying awake.  Feel confused. Summary  Orthostatic hypotension is a sudden drop in blood pressure that happens when you quickly change positions.  Orthostatic hypotension is usually not a serious problem.  It is diagnosed by having your blood pressure taken lying down, sitting, and then standing.  It may be treated by changing your diet or adjusting your medicines. This information is not intended to replace advice given to you by your health care provider. Make sure you discuss any questions you have with your health care provider. Document Revised: 02/15/2018 Document Reviewed: 02/15/2018 Elsevier Patient Education  [place].

## 2020-10-13 NOTE — Progress Notes (Signed)
History of Present Illness:      75 y.o. M with hx of cancer, COPD, HLP, HTN, presenting to the ED for syncopal event. Patient denies LOC. He was walking upstairs in his house and got lightheaded and fell.      In the ER, he had repair of a vertical laceration of his forehead. CXR, Head & Neck CT scans were Negative. XRay of the Left elbow did reveal a fracture and he was placed inj an arm sling & referred to Dr Lennette Bihari Haddix.  Medications  .  levothyroxine 125 MCG tablet, Take  1 tablet  Daily .  albuterol  1.25 MG/3ML nebulizer solution, 1 VIAL VIA NEB EVERY 6 RS AS NEEDED  .  albuterol  HFA inhaler, Use 2 inhalations 15 minutes apart every 4 hours  to Rescue Asthma  .  NORCO 5-325 MG tablet, Take 1 tablet  every 4  hours as needed.  .  citalopram 20 MG tablet, TAKE 1 TABLET  DAILY FOR MOOD .  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   Observations/Objective:  BP 110/66   Pulse 68   Temp (!) 97.4 F (36.3 C)   Resp 16   Ht 5\' 11"  (1.803 m)   Wt 218 lb (98.9 kg)   SpO2 97%   BMI 30.40 kg/m   Postural  Sit BP 126/74   P 77     &    Stand BP  83/46   P 57  HEENT - WNL. Neck - supple.  Chest - Clear equal BS. Cor - Nl HS. RRR w/o sig MGR. PP 1(+). No edema. MS- FROM w/o deformities.  Gait Nl. Neuro -  Nl w/o focal abnormalities.  Forehead wound appears well healed w/o signs of infection.  All sutures removed w/o difficulty and antibiotic non-stick sterile dressing applied.   Assessment and Plan:  1. Autonomic postural hypotension  - Rx Florinef 0.1 mg  Take 1 to 2 tablets daily   - patient  encouraged aggressive fluid intake and monitor frequent standing BP's and call if standing BP's remain low - less than 100 sys. Recc 2 week f/u to monitor BP's.   2. Laceration of forehead  Follow Up Instructions:      I discussed the assessment and treatment plan with the patient. The patient was provided an opportunity to ask questions and all were answered. The patient agreed with the plan and demonstrated an understanding of the instructions.      The patient was advised to call back or seek an in-person evaluation if the symptoms worsen or if the condition fails to improve as anticipated.   Kirtland Bouchard, MD History of Present Illness:      75 y.o. M with hx of cancer, COPD, HLP, HTN, presenting to the ED for syncopal event. Patient denies LOC. He was walking upstairs in his house and got lightheaded and fell.      In the ER, he had repair of a vertical laceration of his forehead. CXR, Head & Neck CT scans were Negative. XRay of the Left elbow did reveal a fracture and he was placed inj an arm sling & referred to Dr Lennette Bihari Haddix.  Medications  .  levothyroxine 125 MCG tablet, Take  1 tablet  Daily .  albuterol  1.25 MG/3ML nebulizer solution, 1 VIAL VIA NEB EVERY 6 RS AS NEEDED  .  albuterol  HFA inhaler, Use 2 inhalations 15 minutes apart every 4 hours  to Rescue Asthma  .  NORCO 5-325 MG tablet, Take 1 tablet  every 4  hours as needed.  .  citalopram 20 MG tablet, TAKE 1 TABLET  DAILY FOR MOOD .  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   Observations/Objective:  BP 110/66   Pulse 68   Temp (!)  97.4 F (36.3 C)   Resp 16   Ht 5\' 11"  (1.803 m)   Wt 218 lb (98.9 kg)   SpO2 97%   BMI 30.40 kg/m   Postural  Sit BP 126/74   P 77     &    Stand BP  83/46   P 57  HEENT - WNL. Neck - supple.  Chest - Clear equal BS. Cor - Nl HS. RRR w/o sig MGR. PP 1(+). No edema. MS- FROM w/o deformities.  Gait Nl. Neuro -  Nl w/o focal abnormalities.  Forehead wound appears well healed w/o signs of infection.  All sutures removed w/o difficulty and antibiotic non-stick sterile dressing applied.   Assessment and Plan:  1. Autonomic postural hypotension  - Rx Florinef 0.1 mg  Take 1 to 2 tablets daily   - patient encouraged aggressive fluid intake and monitor frequent standing BP's and call if standing BP's remain low - less than 100 sys. Recc 2 week f/u to monitor BP's.   2. Laceration of forehead  Follow Up Instructions:      I discussed the assessment and treatment plan with the patient. The patient was provided an opportunity to ask questions and all were answered. The patient agreed with the plan and demonstrated an understanding of the instructions.      The patient was advised to call back or seek an in-person evaluation if the symptoms worsen or if the condition fails to improve as anticipated.   Kirtland Bouchard, MD

## 2020-10-17 ENCOUNTER — Encounter: Payer: Self-pay | Admitting: Internal Medicine

## 2020-10-17 NOTE — Progress Notes (Deleted)
.   History of Present Illness:      75 y.o. M with hx of cancer, COPD, HLP, HTN, presenting to the ED for syncopal event. Patient denies LOC. He was walking upstairs in his house and got lightheaded and fell.      In the ER, he had repair of a vertical laceration of his forehead. CXR, Head & Neck CT scans were Negative. XRay of the Left elbow did reveal a fracture and he was placed inj an arm sling & referred to Dr Lennette Bihari Haddix.  Medications  .  levothyroxine 125 MCG tablet, Take  1 tablet  Daily .  albuterol  1.25 MG/3ML nebulizer solution, 1 VIAL VIA NEB EVERY 6 RS AS NEEDED  .  albuterol  HFA inhaler, Use 2 inhalations 15 minutes apart every 4 hours  to Rescue Asthma  .  NORCO 5-325 MG tablet, Take 1 tablet  every 4  hours as needed.  .  citalopram 20 MG tablet, TAKE 1 TABLET  DAILY FOR MOOD .  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   Observations/Objective:  BP 110/66   Pulse 68   Temp (!) 97.4 F (36.3 C)   Resp 16   Ht 5\' 11"  (1.803 m)   Wt 218 lb (98.9 kg)   SpO2 97%   BMI 30.40 kg/m   Postural  Sit BP 126/74   P 77     &    Stand BP  83/46   P 57  HEENT - WNL. Neck - supple.  Chest - Clear equal BS. Cor - Nl HS. RRR w/o sig MGR. PP 1(+). No edema. MS- FROM w/o deformities.  Gait Nl. Neuro -  Nl w/o focal abnormalities.  Forehead wound appears well healed w/o signs of infection.  All sutures removed w/o difficulty and antibiotic non-stick sterile dressing applied.   Assessment and Plan:  1. Autonomic postural hypotension  - Rx Florinef 0.1 mg  Take 1 to 2 tablets daily   - patient  encouraged aggressive fluid intake and monitor frequent standing BP's and call if standing BP's remain low - less than 100 sys. Recc 2 week f/u to monitor BP's.   2. Laceration of forehead  Follow Up Instructions:      I discussed the assessment and treatment plan with the patient. The patient was provided an opportunity to ask questions and all were answered. The patient agreed with the plan and demonstrated an understanding of the instructions.      The patient was advised to call back or seek an in-person evaluation if the symptoms worsen or if the condition fails to improve as anticipated.   Kirtland Bouchard, MD

## 2020-10-17 NOTE — Progress Notes (Signed)
History of Present Illness:      75 y.o. M with hx of cancer, COPD, HLP, HTN, presenting to the ED for syncopal event. Patient denies LOC. He was walking upstairs in his house and got lightheaded and fell.      In the ER, he had repair of a vertical laceration of his forehead. CXR, Head & Neck CT scans were Negative. XRay of the Left elbow did reveal a fracture and he was placed inj an arm sling & referred to Dr Lennette Bihari Haddix.  Medications  .  levothyroxine 125 MCG tablet, Take  1 tablet  Daily .  albuterol  1.25 MG/3ML nebulizer solution, 1 VIAL VIA NEB EVERY 6 RS AS NEEDED  .  albuterol  HFA inhaler, Use 2 inhalations 15 minutes apart every 4 hours  to Rescue Asthma  .  NORCO 5-325 MG tablet, Take 1 tablet  every 4  hours as needed.  .  citalopram 20 MG tablet, TAKE 1 TABLET  DAILY FOR MOOD .  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   Observations/Objective:  BP 110/66   Pulse 68   Temp (!) 97.4 F (36.3 C)   Resp 16   Ht 5\' 11"  (1.803 m)   Wt 218 lb (98.9 kg)   SpO2 97%   BMI 30.40 kg/m   Postural  Sit BP 126/74   P 77     &    Stand BP  83/46   P 57  HEENT - WNL. Neck - supple.  Chest - Clear equal BS. Cor - Nl HS. RRR w/o sig MGR. PP 1(+). No edema. MS- FROM w/o deformities.  Gait Nl. Neuro -  Nl w/o focal abnormalities.  Forehead wound appears well healed w/o signs of infection.  All sutures removed w/o difficulty and antibiotic non-stick sterile dressing applied.   Assessment and Plan:  1. Autonomic postural hypotension  - Rx Florinef 0.1 mg  Take 1 to 2 tablets daily   - patient  encouraged aggressive fluid intake and monitor frequent standing BP's and call if standing BP's remain low - less than 100 sys. Recc 2 week f/u to monitor BP's.   2. Laceration of forehead  Follow Up Instructions:      I discussed the assessment and treatment plan with the patient. The patient was provided an opportunity to ask questions and all were answered. The patient agreed with the plan and demonstrated an understanding of the instructions.      The patient was advised to call back or seek an in-person evaluation if the symptoms worsen or if the condition fails to improve as anticipated.   Kirtland Bouchard, MD

## 2020-10-20 DIAGNOSIS — S52202D Unspecified fracture of shaft of left ulna, subsequent encounter for closed fracture with routine healing: Secondary | ICD-10-CM | POA: Diagnosis not present

## 2020-11-11 NOTE — Progress Notes (Signed)
History of Present Illness:      This very nice 75 yo MWM with hx/o HTN, cancer , COPD was seen in f/u of  ER visit for a fall with forehead laceration. BP's were postural & patient was Rx'd with Florinef and returns now for follow-up.  Today , patient's BP's are moderately elevated at sit 172/98  And stand 150/94 by nurse   Medications  .  fludrocortisone (FLORINEF) 0.1 MG tablet, Take 1 tablet (0.1 mg total) by mouth daily. Take  1 to 2 tablets  Daily  as Directed for Low BP  .  levothyroxine (SYNTHROID) 125 MCG tablet, Take  1 tablet  Daily on an empty stomach with only water for 30 minutes & no Antacid meds, Calcium or Magnesium for 4 hours & avoid Biotin .  albuterol (ACCUNEB) 1.25 MG/3ML nebulizer solution, USE 1 VIAL VIA NEBULIZER EVERY 6 HOURS AS NEEDED FOR WHEEZING .  albuterol (VENTOLIN HFA) 108 (90 Base) MCG/ACT inhaler, Use     2 inhalations    15 minutes apart    every 4 hours     to Rescue Asthma .  acetaminophen (TYLENOL) 500 MG tablet, Take 500 mg by mouth every 6 (six) hours as needed. .  Ascorbic Acid (VITAMIN C) 1000 MG tablet, Take 1,000 mg by mouth daily. .  cholecalciferol (VITAMIN D) 1000 UNITS tablet, Take 1,000 Units by mouth daily. .  citalopram (CELEXA) 20 MG tablet, TAKE 1 TABLET BY MOUTH DAILY FOR MOOD .  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   Observations/Objective:  Pulse (!) 58  / sit 172/98  & stand 150/94  Temp 97.9 F (36.6 C)   Resp 16   Ht 5\' 11"  (1.803 m)   Wt 221 lb 12.8 oz (100.6 kg)   SpO2 99%   BMI 30.93 kg/m   Recheck postural    sit BP 166/77     P 55      &         stand 173/95    P 56   HEENT - WNL. Neck - supple.  Chest - Clear equal BS. Cor - Nl HS. RRR w/o sig MGR. PP 1(+). No edema. MS- FROM w/o deformities.  Gait Nl. Neuro -  Nl w/o focal abnormalities.  Assessment and Plan:  1. Labile hypertension  - Patient  advised  Hold florinef unless standing BP less than 100 and   Given Losartan 100 mg tabs 3 21  to start 1/2 tab qd and increase to 1 whole tab if BP > 150  - Recommended ROV 3 weeks   - COMPLETE METABOLIC PANEL WITH GFR  2. Autonomic postural hypotension  - CBC with Differential/Platelet - COMPLETE METABOLIC PANEL WITH GFR  3. Medication management  - CBC with Differential/Platelet - COMPLETE METABOLIC PANEL WITH GFR   Follow Up Instructions:    I discussed the assessment and treatment plan with the patient. The patient was provided an opportunity to ask questions and all were answered. The patient agreed with the plan and demonstrated an understanding of the instructions.   The patient was advised to call back or seek an in-person evaluation if the symptoms worsen or  if the condition fails to improve as anticipated.    Kirtland Bouchard, MD

## 2020-11-12 ENCOUNTER — Other Ambulatory Visit: Payer: Self-pay

## 2020-11-12 ENCOUNTER — Ambulatory Visit (INDEPENDENT_AMBULATORY_CARE_PROVIDER_SITE_OTHER): Payer: Medicare Other | Admitting: Internal Medicine

## 2020-11-12 VITALS — HR 58 | Temp 97.9°F | Resp 16 | Ht 71.0 in | Wt 221.8 lb

## 2020-11-12 DIAGNOSIS — R0989 Other specified symptoms and signs involving the circulatory and respiratory systems: Secondary | ICD-10-CM

## 2020-11-12 DIAGNOSIS — Z79899 Other long term (current) drug therapy: Secondary | ICD-10-CM

## 2020-11-12 DIAGNOSIS — I951 Orthostatic hypotension: Secondary | ICD-10-CM

## 2020-11-13 LAB — CBC WITH DIFFERENTIAL/PLATELET
Absolute Monocytes: 1138 cells/uL — ABNORMAL HIGH (ref 200–950)
Basophils Absolute: 63 cells/uL (ref 0–200)
Basophils Relative: 0.8 %
Eosinophils Absolute: 166 cells/uL (ref 15–500)
Eosinophils Relative: 2.1 %
HCT: 43.1 % (ref 38.5–50.0)
Hemoglobin: 14.3 g/dL (ref 13.2–17.1)
Lymphs Abs: 1580 cells/uL (ref 850–3900)
MCH: 32.3 pg (ref 27.0–33.0)
MCHC: 33.2 g/dL (ref 32.0–36.0)
MCV: 97.3 fL (ref 80.0–100.0)
MPV: 9.7 fL (ref 7.5–12.5)
Monocytes Relative: 14.4 %
Neutro Abs: 4953 cells/uL (ref 1500–7800)
Neutrophils Relative %: 62.7 %
Platelets: 228 10*3/uL (ref 140–400)
RBC: 4.43 10*6/uL (ref 4.20–5.80)
RDW: 11.8 % (ref 11.0–15.0)
Total Lymphocyte: 20 %
WBC: 7.9 10*3/uL (ref 3.8–10.8)

## 2020-11-13 LAB — COMPLETE METABOLIC PANEL WITH GFR
AG Ratio: 1.6 (calc) (ref 1.0–2.5)
ALT: 18 U/L (ref 9–46)
AST: 22 U/L (ref 10–35)
Albumin: 4 g/dL (ref 3.6–5.1)
Alkaline phosphatase (APISO): 80 U/L (ref 35–144)
BUN: 15 mg/dL (ref 7–25)
CO2: 28 mmol/L (ref 20–32)
Calcium: 8.8 mg/dL (ref 8.6–10.3)
Chloride: 104 mmol/L (ref 98–110)
Creat: 1.03 mg/dL (ref 0.70–1.18)
GFR, Est African American: 84 mL/min/{1.73_m2} (ref >=60)
GFR, Est Non African American: 72 mL/min/{1.73_m2} (ref >=60)
Globulin: 2.5 g/dL (calc) (ref 1.9–3.7)
Glucose, Bld: 99 mg/dL (ref 65–99)
Potassium: 3.8 mmol/L (ref 3.5–5.3)
Sodium: 141 mmol/L (ref 135–146)
Total Bilirubin: 0.4 mg/dL (ref 0.2–1.2)
Total Protein: 6.5 g/dL (ref 6.1–8.1)

## 2020-11-13 NOTE — Progress Notes (Signed)
============================================================ -   Test results slightly outside the reference range are not unusual. If there is anything important, I will review this with you,  otherwise it is considered normal test values.  If you have further questions,  please do not hesitate to contact me at the office or via My Chart.  ============================================================ ============================================================  -  CBC - Normal Red & White Cell counts  ============================================================ ============================================================  -  All Else -  Kidneys - Electrolytes  & Liver  - all  Normal / OK  ============================================================ ============================================================

## 2020-11-14 ENCOUNTER — Encounter: Payer: Self-pay | Admitting: Internal Medicine

## 2020-11-24 DIAGNOSIS — S52202D Unspecified fracture of shaft of left ulna, subsequent encounter for closed fracture with routine healing: Secondary | ICD-10-CM | POA: Diagnosis not present

## 2020-11-25 ENCOUNTER — Encounter (HOSPITAL_COMMUNITY): Payer: Self-pay

## 2020-11-25 ENCOUNTER — Ambulatory Visit: Payer: Medicare Other | Admitting: Internal Medicine

## 2020-11-25 ENCOUNTER — Other Ambulatory Visit: Payer: Self-pay

## 2020-11-25 ENCOUNTER — Emergency Department (HOSPITAL_COMMUNITY): Payer: Medicare Other

## 2020-11-25 ENCOUNTER — Emergency Department (HOSPITAL_COMMUNITY)
Admission: EM | Admit: 2020-11-25 | Discharge: 2020-11-26 | Disposition: A | Discharge status: Home / Self Care | Payer: Medicare Other | Attending: Emergency Medicine | Admitting: Emergency Medicine

## 2020-11-25 DIAGNOSIS — E039 Hypothyroidism, unspecified: Secondary | ICD-10-CM | POA: Insufficient documentation

## 2020-11-25 DIAGNOSIS — J439 Emphysema, unspecified: Secondary | ICD-10-CM | POA: Diagnosis not present

## 2020-11-25 DIAGNOSIS — I10 Essential (primary) hypertension: Secondary | ICD-10-CM | POA: Insufficient documentation

## 2020-11-25 DIAGNOSIS — Z79899 Other long term (current) drug therapy: Secondary | ICD-10-CM | POA: Diagnosis not present

## 2020-11-25 DIAGNOSIS — J449 Chronic obstructive pulmonary disease, unspecified: Secondary | ICD-10-CM | POA: Insufficient documentation

## 2020-11-25 DIAGNOSIS — Z87891 Personal history of nicotine dependence: Secondary | ICD-10-CM | POA: Diagnosis not present

## 2020-11-25 DIAGNOSIS — I6522 Occlusion and stenosis of left carotid artery: Secondary | ICD-10-CM | POA: Diagnosis not present

## 2020-11-25 DIAGNOSIS — R569 Unspecified convulsions: Secondary | ICD-10-CM | POA: Insufficient documentation

## 2020-11-25 DIAGNOSIS — I2510 Atherosclerotic heart disease of native coronary artery without angina pectoris: Secondary | ICD-10-CM | POA: Diagnosis not present

## 2020-11-25 DIAGNOSIS — Z85118 Personal history of other malignant neoplasm of bronchus and lung: Secondary | ICD-10-CM | POA: Diagnosis not present

## 2020-11-25 LAB — CBC
HCT: 47.3 % (ref 39.0–52.0)
Hemoglobin: 15.4 g/dL (ref 13.0–17.0)
MCH: 32 pg (ref 26.0–34.0)
MCHC: 32.6 g/dL (ref 30.0–36.0)
MCV: 98.1 fL (ref 80.0–100.0)
Platelets: 249 10*3/uL (ref 150–400)
RBC: 4.82 MIL/uL (ref 4.22–5.81)
RDW: 12.5 % (ref 11.5–15.5)
WBC: 10.2 10*3/uL (ref 4.0–10.5)
nRBC: 0 % (ref 0.0–0.2)

## 2020-11-25 LAB — URINALYSIS, ROUTINE W REFLEX MICROSCOPIC
Bacteria, UA: NONE SEEN
Bilirubin Urine: NEGATIVE
Glucose, UA: NEGATIVE mg/dL
Ketones, ur: 5 mg/dL — AB
Leukocytes,Ua: NEGATIVE
Nitrite: NEGATIVE
Protein, ur: NEGATIVE mg/dL
Specific Gravity, Urine: 1.009 (ref 1.005–1.030)
pH: 6 (ref 5.0–8.0)

## 2020-11-25 LAB — BASIC METABOLIC PANEL
Anion gap: 6 (ref 5–15)
BUN: 13 mg/dL (ref 8–23)
CO2: 27 mmol/L (ref 22–32)
Calcium: 9.4 mg/dL (ref 8.9–10.3)
Chloride: 104 mmol/L (ref 98–111)
Creatinine, Ser: 1.16 mg/dL (ref 0.61–1.24)
GFR, Estimated: >60 mL/min (ref >60)
Glucose, Bld: 131 mg/dL — ABNORMAL HIGH (ref 70–99)
Potassium: 4.3 mmol/L (ref 3.5–5.1)
Sodium: 137 mmol/L (ref 135–145)

## 2020-11-25 LAB — TROPONIN I (HIGH SENSITIVITY)
Troponin I (High Sensitivity): 8 ng/L (ref <18)
Troponin I (High Sensitivity): 9 ng/L (ref <18)

## 2020-11-25 LAB — CBG MONITORING, ED: Glucose-Capillary: 99 mg/dL (ref 70–99)

## 2020-11-25 LAB — RAPID URINE DRUG SCREEN, HOSP PERFORMED
Amphetamines: NOT DETECTED
Barbiturates: NOT DETECTED
Benzodiazepines: NOT DETECTED
Cocaine: NOT DETECTED
Opiates: NOT DETECTED
Tetrahydrocannabinol: NOT DETECTED

## 2020-11-25 LAB — ETHANOL: Alcohol, Ethyl (B): <10 mg/dL (ref <10)

## 2020-11-25 LAB — MAGNESIUM: Magnesium: 2.1 mg/dL (ref 1.7–2.4)

## 2020-11-25 MED ORDER — GADOBUTROL 1 MMOL/ML IV SOLN
10.0000 mL | Freq: Once | INTRAVENOUS | Status: AC | PRN
  Administered 2020-11-25: 10 mL via INTRAVENOUS

## 2020-11-25 MED ORDER — IOHEXOL 350 MG/ML SOLN
75.0000 mL | Freq: Once | INTRAVENOUS | Status: AC | PRN
  Administered 2020-11-25: 75 mL via INTRAVENOUS

## 2020-11-25 MED ORDER — SODIUM CHLORIDE 0.9 % IV SOLN
3000.0000 mg | Freq: Once | INTRAVENOUS | Status: AC
  Administered 2020-11-25: 3000 mg via INTRAVENOUS
  Filled 2020-11-25: qty 30

## 2020-11-25 NOTE — ED Triage Notes (Signed)
Patient has episode of weakness today and had witnessed syncopal/seizure today. Patient doesn't remember anything related to event. On assessment alert and oriented. No neuro deficits on arrival. Patient has been having BP fluctuations for several weeks and unsure if this event related to same. Patient had syncopal event in January which was similar.

## 2020-11-25 NOTE — ED Provider Notes (Incomplete)
[place] EMERGENCY DEPARTMENT Provider Note   CSN: [id number] Arrival date & time: 11/25/20  1558     History No chief complaint on file.   Christopher Peck is a 75 y.o. male who presents after seizure-like episode at home.  Patient states he was coming down the stairs when he "felt like something was not right in my head so I sat down on the step".  Shortly after that his wife came in from the yard and found him sitting there is "staring off into space" and not responding to her questions.  She states shortly after his arms contracted up in his eyes rolled back in his head and he began to tremor.  She states this lasted approximately 30 seconds and he slowly regained consciousness but was groggy for several minutes afterwards.  Patient had a reported syncopal episode in January of this year, unfortunately was unwitnessed.  He had normal CT of the head at that time, but has been found to have labile blood pressure which is been managed by his primary care doctor.  He was placed on Florinef for hypotension, however was found to have hypertension subsequently at another visit.  He was taken off the Florinef and started on losartan.  Blood pressures have been managed well at home, however he is episodes of severe orthostasis.  He was orthostatic after syncopal episode in January.  I personally reviewed this patient's medical records.  Has history of lung cancer status post surgical and radiation therapy in 2007.  Additionally has history of CAD, labile hypertension, hyperlipidemia, hypothyroidism, COPD, and postradiation fibrosis of the lungs.  He is not on any anticoagulation and does not have a pacemaker in place.  HPI     Past Medical History:  Diagnosis Date  . Emphysema of lung ([place])   . History of bilateral inguinal hernia repair 05/11/2009  . Hypertension     Patient Active Problem List   Diagnosis Date Noted  . Overweight (BMI 25.0-29.9) 07/24/2018  . Abnormal  glucose 04/19/2018  . 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 Labile hypertension 01/15/2014  . Vitamin D deficiency 01/15/2014  . Hyperlipidemia, mixed 01/15/2014  . Hypothyroidism 01/15/2014  . COPD 01/15/2014  . BPH/Prostatism 01/15/2014  . Radiation fibrosis of lung ([place]) 07/12/2013  . CORONARY ATHEROSCLEROSIS NATIVE CORONARY ARTERY 05/11/2009    Past Surgical History:  Procedure Laterality Date  . HERNIA REPAIR         Family History  Problem Relation Age of Onset  . COPD Father   . Kidney failure Father     Social History   Tobacco Use  . Smoking status: Former Smoker    Quit date: 09/05/2004    Years since quitting: 16.2  . Smokeless tobacco: Never Used  Substance Use Topics  . Alcohol use: Yes    Alcohol/week: 3.0 standard drinks    Types: 3 Standard drinks or equivalent per week    Home Medications Prior to Admission medications   Medication Sig Start Date End Date Taking? Authorizing Provider  acetaminophen (TYLENOL) 325 MG tablet Take 325 mg by mouth every 6 (six) hours as needed for mild pain (or headaches).   Yes [provider]  albuterol (ACCUNEB) 1.25 MG/3ML nebulizer solution USE 1 VIAL VIA NEBULIZER EVERY 6 HOURS AS NEEDED FOR WHEEZING Patient taking differently: Take 1 ampule by nebulization every 6 (six) hours as needed for wheezing or shortness of breath. 08/14/20  Yes Garnet Sierras, NP  albuterol (VENTOLIN HFA) 108 (90 Base) MCG/ACT inhaler Use     2 inhalations    15 minutes apart    every 4 hours     to Rescue Asthma Patient taking differently: Inhale 2 puffs into the lungs See admin instructions. Inhale 2 puffs into the lungs, 15 minutes apart, every four hours- as needed "to rescue from asthma" 05/30/20  Yes Unk Pinto, MD  citalopram (CELEXA) 20 MG tablet TAKE 1 TABLET BY MOUTH DAILY FOR MOOD Patient taking differently: Take 20 mg by mouth in the morning. 09/09/20  Yes [name], NP   finasteride (PROSCAR) 5 MG tablet TAKE 1 TABLET BY MOUTH EVERY DAY Patient taking differently: Take 5 mg by mouth in the morning. 09/09/20  Yes [name], NP  fludrocortisone (FLORINEF) 0.1 MG tablet Take 1 tablet (0.1 mg total) by mouth daily. Take  1 to 2 tablets  Daily  as Directed for Low BP Patient taking differently: Take 0.05 mg by mouth daily as needed (if Systolic number drops below 100). 10/13/20  Yes Unk Pinto, MD  levETIRAcetam (KEPPRA) 500 MG tablet Take 1 tablet (500 mg total) by mouth 2 (two) times daily. 11/26/20 12/26/20 Yes Sponseller, Gypsy Balsam, PA-C  levothyroxine (SYNTHROID) 125 MCG tablet Take  1 tablet  Daily on an empty stomach with only water for 30 minutes & no Antacid meds, Calcium or Magnesium for 4 hours & avoid Biotin Patient taking differently: Take 62.5-125 mcg by mouth See admin instructions. Take 62.5 mcg by mouth with water, 30 minutes before breakfast in the morning, on Sun/Tues/Thurs/Sat and 125 mcg on Mon/Wed/Fri-  no antacid meds, calcium, or magnesium for 4 hours and avoid biotin 10/01/20  Yes Unk Pinto, MD  losartan (COZAAR) 100 MG tablet Take 50 mg by mouth in the morning.   Yes [provider]  NON FORMULARY Take 2 tablets by mouth See admin instructions. VitaFusion for men- Chew 2 gummies by mouth in the afternoon- daily   Yes [provider]    Allergies    Patient has no known allergies.  Review of Systems   Review of Systems  Constitutional: Negative for activity change, appetite change, chills, diaphoresis, fatigue and fever.  HENT: Negative.   Respiratory: Negative.   Cardiovascular: Negative.   Gastrointestinal: Negative.   Genitourinary: Negative.   Musculoskeletal: Negative.   Skin: Positive for pallor.  Neurological: Positive for seizures, syncope and headaches. Negative for facial asymmetry, speech difficulty, weakness, light-headedness and numbness.    Physical Exam Updated Vital Signs BP 133/82 (BP  Location: Right Arm)   Pulse (!) 53   Temp 98.1 F (36.7 C) (Oral)   Resp 17   Ht 5\' 9"  (1.753 m)   Wt 100.2 kg   SpO2 97%   BMI 32.64 kg/m   Physical Exam Vitals and nursing note reviewed.  HENT:     Head: Normocephalic and atraumatic.      Right Ear: Tympanic membrane normal.     Left Ear: Tympanic membrane normal.     Nose: Nose normal.     Mouth/Throat:     Mouth: Mucous membranes are moist.     Pharynx: Oropharynx is clear. Uvula midline. No oropharyngeal exudate or posterior oropharyngeal erythema.     Tonsils: No tonsillar exudate.  Eyes:     General: Lids are normal. Vision grossly intact.        Right eye: No discharge.        Left eye: No discharge.  Extraocular Movements: Extraocular movements intact.     Conjunctiva/sclera: Conjunctivae normal.     Pupils: Pupils are equal, round, and reactive to light.  Neck:     Trachea: Trachea and phonation normal.  Cardiovascular:     Rate and Rhythm: Normal rate and regular rhythm.     Pulses: Normal pulses.     Heart sounds: Normal heart sounds. No murmur heard.   Pulmonary:     Effort: Pulmonary effort is normal. No respiratory distress.     Breath sounds: Normal breath sounds. No wheezing or rales.  Chest:     Chest wall: No mass, lacerations, deformity, swelling, tenderness or crepitus.    Abdominal:     General: Bowel sounds are normal. There is no distension.     Palpations: Abdomen is soft.     Tenderness: There is no abdominal tenderness. There is no right CVA tenderness, left CVA tenderness, guarding or rebound.  Musculoskeletal:        General: No deformity.     Right shoulder: Normal.     Left shoulder: Normal.     Right upper arm: Normal.     Left upper arm: Normal.     Right elbow: Normal.     Left elbow: Normal.     Right forearm: Normal.     Left forearm: Normal.     Right wrist: Normal.     Left wrist: Normal.     Right hand: Normal.     Left hand: Normal.     Cervical back: Normal,  normal range of motion and neck supple. No rigidity, tenderness or crepitus. No pain with movement, spinous process tenderness or muscular tenderness.     Thoracic back: Normal. No tenderness or bony tenderness.     Lumbar back: Normal. No tenderness or bony tenderness.     Right hip: Normal.     Left hip: Normal.     Right upper leg: Normal.     Left upper leg: Normal.     Right knee: Normal.     Left knee: Normal.     Right lower leg: Normal. No edema.     Left lower leg: Normal. No edema.     Right ankle: Normal.     Right Achilles Tendon: Normal.     Left ankle: Normal.     Left Achilles Tendon: Normal.     Right foot: Normal.     Left foot: Normal.     Comments: 5/5 strength in grip bilaterally  5/5 strength in elbow flexion and extension bilaterally  5/5 strength in plantar- and dorsiflexion bilaterally   Lymphadenopathy:     Cervical: No cervical adenopathy.  Skin:    General: Skin is warm and dry.     Capillary Refill: Capillary refill takes less than 2 seconds.  Neurological:     General: No focal deficit present.     Mental Status: He is alert and oriented to person, place, and time. Mental status is at baseline.     Cranial Nerves: Cranial nerves are intact.     Sensory: Sensation is intact.     Motor: Motor function is intact. No pronator drift.     Coordination: Coordination is intact. Romberg sign negative.     Gait: Gait is intact.  Psychiatric:        Mood and Affect: Mood normal.    ED Results / Procedures / Treatments   Labs (all labs ordered are listed, but only abnormal results are displayed)  Labs Reviewed  BASIC METABOLIC PANEL - Abnormal; Notable for the following components:      Result Value   Glucose, Bld 131 (*)    All other components within normal limits  URINALYSIS, ROUTINE W REFLEX MICROSCOPIC - Abnormal; Notable for the following components:   Color, Urine STRAW (*)    Hgb urine dipstick SMALL (*)    Ketones, ur 5 (*)    All other  components within normal limits  CBC  MAGNESIUM  RAPID URINE DRUG SCREEN, HOSP PERFORMED  ETHANOL  CBG MONITORING, ED  TROPONIN I (HIGH SENSITIVITY)  TROPONIN I (HIGH SENSITIVITY)   EKG EKG Interpretation  Date/Time:  Wednesday November 25 2020 18:27:56 EDT Ventricular Rate:  60 PR Interval:  174 QRS Duration: 88 QT Interval:  415 QTC Calculation: 415 R Axis:   -10 Text Interpretation: Sinus rhythm Abnormal R-wave progression, early transition Left ventricular hypertrophy When compared with ECG of EARLIER SAME DATE No significant change was found Confirmed by Delora Fuel ([id number]) on 11/26/2020 1:14:56 AM   Radiology CT Angio Head W/Cm &/Or Wo Cm  Result Date: 11/26/2020 CLINICAL DATA:  Initial evaluation for weakness, syncope/seizure. Abnormal flow void on previous MRI. EXAM: CT ANGIOGRAPHY HEAD AND NECK TECHNIQUE: Multidetector CT imaging of the head and neck was performed using the standard protocol during bolus administration of intravenous contrast. Multiplanar CT image reconstructions and MIPs were obtained to evaluate the vascular anatomy. Carotid stenosis measurements (when applicable) are obtained utilizing NASCET criteria, using the distal internal carotid diameter as the denominator. CONTRAST:  110mL OMNIPAQUE IOHEXOL 350 MG/ML SOLN COMPARISON:  Prior MRI from earlier the same day. FINDINGS: CTA NECK FINDINGS Aortic arch: Visualized aortic arch normal in caliber with normal branch pattern. Mild for age atheromatous plaque noted within the aortic arch. Visualized subclavian arteries widely patent. Right carotid system: Right common and internal carotid arteries widely patent without stenosis, dissection or occlusion. Minimal atheromatous plaque about the proximal right ICA without stenosis. Left carotid system: Short-segment severe near occlusive stenosis noted at the proximal left CCA just distal to its origin (series 8, image 149). Suggestion of an associated radiographic string sign.  This is progressed as compared to prior chest CT from 02/12/2016. Left CCA patent distally to the bifurcation without significant stenosis. Scattered mixed plaque about the left CCA/proximal left ICA with additional mild 40% stenosis by NASCET criteria (series 6, image 310). Left ICA somewhat diminutive but otherwise patent to the skull base without stenosis, dissection or occlusion. Vertebral arteries: Both vertebral arteries arise from the subclavian arteries. Right vertebral artery dominant. Vertebral arteries widely patent within the neck without stenosis, dissection or occlusion. Skeleton: No visible acute osseous finding. Compression deformities at the superior endplates of T3 and T5 are chronic in appearance. She no discrete or worrisome osseous lesions. 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 Patient is edentulous. Other neck: No other acute soft tissue abnormality within the neck. No mass or adenopathy. Chronic fatty atrophy of the right parotid gland noted. Chronic right maxillary sinusitis noted. Upper chest: Post treatment changes seen at the left perihilar region/medial left lung. 5 mm nodule present at the right lung apex (series 5, image 31). Visualized upper chest demonstrates no other acute finding. Review of the MIP images confirms the above findings CTA HEAD FINDINGS Anterior circulation: Petrous, cavernous, and supraclinoid segments of both internal carotid arteries patent without significant stenosis or other abnormality. A1 segments patent bilaterally. Normal anterior communicating artery complex. Anterior cerebral arteries patent  to their distal aspects without stenosis. No M1 stenosis or occlusion. Normal MCA bifurcations. Distal MCA branches well perfused and symmetric. Posterior circulation: Both V4 segments patent to the vertebrobasilar junction without stenosis. Both PICA origins patent and normal. Basilar patent to its distal aspect without stenosis. Superior  cerebellar arteries patent bilaterally. Both PCA supplied via hypoplastic P1 segments and prominent bilateral posterior communicating arteries. Both PCAs well perfused to their distal aspects without stenosis. Venous sinuses: Patent. Anatomic variants: None significant.  No aneurysm. Review of the MIP images confirms the above findings IMPRESSION: 1. 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 Finding is progressed and worsened as compared to prior chest CT from 02/12/2016. 2. Additional mild 40% atheromatous stenosis about the proximal cervical left ICA. 3. Otherwise wide patency of the major arterial vasculature of the head and neck. No other hemodynamically significant or correctable stenosis. 4. Post treatment changes at the left perihilar region/medial left lung. 5. 5 mm right upper lobe pulmonary nodule, indeterminate. No follow-up needed if patient is low-risk. Non-contrast chest CT can be considered in 12 months if patient is high-risk. This recommendation follows the consensus statement: Guidelines for Management of Incidental Pulmonary Nodules Detected on CT Images: From the Fleischner Society 2017; Radiology 2017; 284:228-243. Electronically Signed   By: Jeannine Boga M.D.   On: 11/26/2020 00:17   CT Head Wo Contrast  Result Date: 11/25/2020 CLINICAL DATA:  Seizure EXAM: CT HEAD WITHOUT CONTRAST TECHNIQUE: Contiguous axial images were obtained from the base of the skull through the vertex without intravenous contrast. COMPARISON:  None. FINDINGS: Brain: No evidence of acute territorial infarction, hemorrhage, hydrocephalus,extra-axial collection or mass lesion/mass effect. There is dilatation the ventricles and sulci consistent with age-related atrophy. Low-attenuation changes in the deep white matter consistent with small vessel ischemia. Vascular: No hyperdense vessel or unexpected calcification. Skull: The skull is intact. No fracture or  focal lesion identified. Sinuses/Orbits: Again noted is hyperdense material seen within the right maxillary sinus with a mottled appearance to the maxillary wall. The orbits and globes intact. Other: None IMPRESSION: No acute intracranial abnormality. Findings consistent with age related atrophy and chronic small vessel ischemia Electronically Signed   By: Prudencio Pair M.D.   On: 11/25/2020 19:08   CT Angio Neck W and/or Wo Contrast  Result Date: 11/26/2020 CLINICAL DATA:  Initial evaluation for weakness, syncope/seizure. Abnormal flow void on previous MRI. EXAM: CT ANGIOGRAPHY HEAD AND NECK TECHNIQUE: Multidetector CT imaging of the head and neck was performed using the standard protocol during bolus administration of intravenous contrast. Multiplanar CT image reconstructions and MIPs were obtained to evaluate the vascular anatomy. Carotid stenosis measurements (when applicable) are obtained utilizing NASCET criteria, using the distal internal carotid diameter as the denominator. CONTRAST:  87mL OMNIPAQUE IOHEXOL 350 MG/ML SOLN COMPARISON:  Prior MRI from earlier the same day. FINDINGS: CTA NECK FINDINGS Aortic arch: Visualized aortic arch normal in caliber with normal branch pattern. Mild for age atheromatous plaque noted within the aortic arch. Visualized subclavian arteries widely patent. Right carotid system: Right common and internal carotid arteries widely patent without stenosis, dissection or occlusion. Minimal atheromatous plaque about the proximal right ICA without stenosis. Left carotid system: Short-segment severe near occlusive stenosis noted at the proximal left CCA just distal to its origin (series 8, image 149). Suggestion of an associated radiographic string sign. This is progressed as compared to prior chest CT from 02/12/2016. Left CCA patent distally to the bifurcation without  significant stenosis. Scattered mixed plaque about the left CCA/proximal left ICA with additional mild 40% stenosis  by NASCET criteria (series 6, image 310). Left ICA somewhat diminutive but otherwise patent to the skull base without stenosis, dissection or occlusion. Vertebral arteries: Both vertebral arteries arise from the subclavian arteries. Right vertebral artery dominant. Vertebral arteries widely patent within the neck without stenosis, dissection or occlusion. Skeleton: No visible acute osseous finding. Compression deformities at the superior endplates of T3 and T5 are chronic in appearance. She no discrete or worrisome osseous lesions. 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 Patient is edentulous. Other neck: No other acute soft tissue abnormality within the neck. No mass or adenopathy. Chronic fatty atrophy of the right parotid gland noted. Chronic right maxillary sinusitis noted. Upper chest: Post treatment changes seen at the left perihilar region/medial left lung. 5 mm nodule present at the right lung apex (series 5, image 31). Visualized upper chest demonstrates no other acute finding. Review of the MIP images confirms the above findings CTA HEAD FINDINGS Anterior circulation: Petrous, cavernous, and supraclinoid segments of both internal carotid arteries patent without significant stenosis or other abnormality. A1 segments patent bilaterally. Normal anterior communicating artery complex. Anterior cerebral arteries patent to their distal aspects without stenosis. No M1 stenosis or occlusion. Normal MCA bifurcations. Distal MCA branches well perfused and symmetric. Posterior circulation: Both V4 segments patent to the vertebrobasilar junction without stenosis. Both PICA origins patent and normal. Basilar patent to its distal aspect without stenosis. Superior cerebellar arteries patent bilaterally. Both PCA supplied via hypoplastic P1 segments and prominent bilateral posterior communicating arteries. Both PCAs well perfused to their distal aspects without stenosis. Venous sinuses: Patent. Anatomic  variants: None significant.  No aneurysm. Review of the MIP images confirms the above findings IMPRESSION: 1. 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 Finding is progressed and worsened as compared to prior chest CT from 02/12/2016. 2. Additional mild 40% atheromatous stenosis about the proximal cervical left ICA. 3. Otherwise wide patency of the major arterial vasculature of the head and neck. No other hemodynamically significant or correctable stenosis. 4. Post treatment changes at the left perihilar region/medial left lung. 5. 5 mm right upper lobe pulmonary nodule, indeterminate. No follow-up needed if patient is low-risk. Non-contrast chest CT can be considered in 12 months if patient is high-risk. This recommendation follows the consensus statement: Guidelines for Management of Incidental Pulmonary Nodules Detected on CT Images: From the Fleischner Society 2017; Radiology 2017; 284:228-243. Electronically Signed   By: Jeannine Boga M.D.   On: 11/26/2020 00:17   MR Brain W and Wo Contrast  Result Date: 11/25/2020 CLINICAL DATA:  Initial evaluation for acute seizure.  She EXAM: MRI HEAD WITHOUT AND WITH CONTRAST TECHNIQUE: Multiplanar, multiecho pulse sequences of the brain and surrounding structures were obtained without and with intravenous contrast. CONTRAST:  56mL GADAVIST GADOBUTROL 1 MMOL/ML IV SOLN COMPARISON:  Comparison made with prior head CT from earlier same day as well as previous brain MRI from 05/11/2005 FINDINGS: Brain: Cerebral volume within normal limits for age. Single subcentimeter focus of FLAIR hyperintensity noted involving the left frontal periventricular white matter. No other significant cerebral white matter disease. No abnormal foci of restricted diffusion to suggest acute or subacute ischemia or changes related to seizure. No encephalomalacia to suggest chronic cortical infarction. No evidence for acute or chronic  intracranial hemorrhage. Again seen is a focal area of increased FLAIR hyperintensity involving  a cortical gyrus at the left parieto-occipital region (series 7, image 16). The gyrus is somewhat thickened in appearance at this level. Suggestion of radiating signal abnormality towards the underlying left atrium (series 7, image 15). No associated enhancement or susceptibility artifact. 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 Possible low-grade glioma could also conceivably have this appearance as previously postulated, although no significant change in appearance of this lesion since 2006 would perhaps argue against this. No other mass lesion, midline shift or mass effect. No hydrocephalus or extra-axial fluid collection. Pituitary gland suprasellar region within normal limits. Midline structures intact and normal. No other abnormal enhancement. Vascular: Diminished flow void within the visualized left ICA to the cavernous segment, which could reflect a proximal stenosis (series 6, image 8). Major intracranial vascular flow voids are otherwise maintained. Skull and upper cervical spine: Cerebellar tonsils are minimally low-lying without frank Chiari malformation. Caring cervical junction otherwise unremarkable. Bone marrow signal intensity normal. No focal marrow replacing lesion. No scalp soft tissue abnormality. Sinuses/Orbits: Patient status post bilateral ocular lens replacement. Globes and orbital soft tissues demonstrate no acute finding. Mucosal thickening noted within the right maxillary sinus. Paranasal sinuses are otherwise clear. No mastoid effusion. Inner ear structures within normal limits. Other: Visualized right parotid gland is either absent and/or atrophic. IMPRESSION: 1. No acute intracranial abnormality. 2. 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 Possible  low-grade glioma could also have this appearance as previously postulated, although no significant change in appearance since 2006 would perhaps argue against this. 3. Diminished flow void within the visualized left ICA to the cavernous segment, which could reflect a proximal stenosis. This could be further assessed with dedicated vascular imaging of the neck as clinically warranted. Electronically Signed   By: Jeannine Boga M.D.   On: 11/25/2020 22:55   DG Chest Portable 1 View  Result Date: 11/25/2020 CLINICAL DATA:  Weakness, syncope, emphysema, history of left lung cancer in 2006 EXAM: PORTABLE CHEST 1 VIEW COMPARISON:  10/02/2020 FINDINGS: Single frontal view of the chest demonstrates an unremarkable cardiac silhouette. Stable post therapeutic changes are seen at the left hilum, with volume loss in the left hemithorax and elevation of the left hemidiaphragm unchanged. No acute airspace disease, effusion, or pneumothorax. No acute bony abnormalities. IMPRESSION: 1. Stable postsurgical changes and volume loss within the left hemithorax. 2. No acute intrathoracic process. Electronically Signed   By: Randa Ngo M.D.   On: 11/25/2020 17:54    Procedures Procedures   Medications Ordered in ED Medications  levETIRAcetam (KEPPRA) 3,000 mg in sodium chloride 0.9 % 250 mL IVPB (0 mg Intravenous Stopped 11/25/20 2332)  gadobutrol (GADAVIST) 1 MMOL/ML injection 10 mL (10 mLs Intravenous Contrast Given 11/25/20 2048)  iohexol (OMNIPAQUE) 350 MG/ML injection 75 mL (75 mLs Intravenous Contrast Given 11/25/20 2310)    ED Course  I have reviewed the triage vital signs and the nursing notes.  Pertinent labs & imaging results that were available during my care of the patient were reviewed by me and considered in my medical decision making (see chart for details).  Clinical Course as of 11/26/20 1353  Wed Nov 25, 2020  1806 Potassium: 4.3 [RS]  1944 Consult to neurohospitalist, Dr. Rory Percy, who  recommends MRI w/wo of the brain, keppra loading dose of 3 g IV, and Keppra 500 mg BID outpatient. If MRI is normal, may follow up outpatient. If  abnormal will admit to medicine where neuro will continue to consult. I appreciate his collaboration in the care of this patient.  [RS]  2308 Consult placed to urologist, Dr. Rory Percy, for review of MRI. He recommends proceeding with CT angiogram of the head and the neck at this time.  If reassuring may discharge on Keppra as previously described, with close neurology outpatient follow-up. 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 He recommends close vascular surgery outpatient follow-up within the next week for further evaluation. [RS]    Clinical Course User Index [RS] Sponseller, Sharlene Dory   MDM Rules/Calculators/A&P                         75 year old male presents with concern for seizure-like episode at home.  Syncopal episode recorded in January.  The differential for syncope is extensive and includes, but is not limited to: arrythmia (Vtach, SVT, SSS, sinus arrest, AV block, bradycardia), aortic stenosis, AMI, hypertrophic obstructive cardiomyopathy (HOCM), PE, atrial myxoma, pulmonary hypertension, orthostatic hypotension, hypovolemia, drug effect, GB syndrome, micturition, cough, carotid sinus sensitivity, seizure, TIA/CVA, hypoglycemia, vertigo.  Differential diagnosis for seizure includes but is not limited to epileptic seizure, meningitis, brain abscess, intracranial hemorrhage, drug withdrawal, hydrocephalus, toxic ingestion, intercranial mass, syncope, headache, electrolyte derangement.  Vital signs are normal on intake, cardiopulmonary exam is normal, abdominal exam is benign.  Musculoskeletal exam without signs of trauma and is normocephalic and atraumatic.   Neurologic exam is very reassuring.  There is no focal deficit on neurologic exam.  CBC unremarkable, BMP unremarkable, UA with small amount of hemoglobin reassuring.  Magnesium is normal, CBG is normal, UDS negative, alcohol is negative, troponin is normal.  CT scan of the brain is negative for acute cranial abnormality though findings are consistent with age-related atrophy.  Chest x-ray negative for acute intrathoracic process.  EKG is reassuring without acute lethal or life-threatening arrhythmia.  Consult to neuro hospitalist as above.  Will proceed with loading dose of Keppra and MRI of the brain.  Disposition to be determined pending MRI.  MRI with concern for possible stenosis of the left ICA, neurology consult recommending CT angiogram for completion of work-up.  Patient will still be discharged home on Keppra dosage with outpatient neuro follow-up.  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  Discussed case with neurologist who recommends close outpatient vascular surgery follow-up and beginning aspirin at this time.  No indication for admission given patient has not had any symptoms of a CVA and has a normal neurologic exam.  Will place ambulatory referral to vascular surgery.  Jawara voiced understanding of his medical evaluation and treatment plan.  Each of his questions was answered to his expressed satisfaction.  Return precautions given.  Patient is stable and appropriate for discharge at this time.  This chart was dictated using voice recognition software, Dragon. Despite the best efforts of this provider to proofread and correct errors, errors may still occur which can change documentation meaning.  Final Clinical Impression(s) / ED Diagnoses Final diagnoses:  Seizure ([place])    Rx / DC Orders ED Discharge Orders         Ordered    levETIRAcetam (KEPPRA) 500 MG tablet  2 times daily        11/26/20 0004  Ambulatory referral to Vascular  Surgery       Comments: Patient needs to be seen by vascular surgery within the next week for evaluation due to near occlusive stenosis of left common carotid artery   11/26/20 0059           Sponseller, Gypsy Balsam, PA-C 11/26/20 0106    Sponseller, Gypsy Balsam, PA-C 11/26/20 1353    Horton, Alvin Critchley, DO 11/26/20 1622

## 2020-11-25 NOTE — ED Notes (Signed)
Transported to CT 

## 2020-11-26 ENCOUNTER — Ambulatory Visit: Payer: Medicare Other | Admitting: Internal Medicine

## 2020-11-26 MED ORDER — LEVETIRACETAM 500 MG PO TABS: 500.0000 mg | ORAL_TABLET | Freq: Two times a day (BID) | ORAL | 0 refills | Status: DC

## 2020-11-26 NOTE — Discharge Instructions (Addendum)
You were seen in the emergency department today after seizure-like episode at home.  Your physical exam, vital signs, blood work, and imaging studies were very reassuring.  While there is no clear cause for your seizure, there is not appear to be any emergent issue at this time.  You are safe to be discharged home this evening.  You have been prescribed a medication to prevent you from having another seizure.  Please take this twice daily as prescribed.  Additionally should follow-up with the neurologic providers, at [facility], as listed below.  Please call their office first thing tomorrow morning for scheduling of a follow-up appointment within the next week.  Of note the CT angiograms which look at the vasculature in your head and neck found severe stenosis of the left common carotid artery.  This is not an immediately emergent issue, but is something that needs to be evaluated urgently by vascular surgery in the outpatient setting.  Please begin taking a daily baby aspirin, and call their office first thing tomorrow to schedule follow-up appointment TO BE SEEN [place].   Return to the ER if he develops any recurrent seizure-like activity, pass out, if he develops any blurry vision, double vision, confusion, chest pain, shortness breath, palpitations, or any other new severe symptoms.

## 2020-11-30 ENCOUNTER — Encounter: Payer: Self-pay | Admitting: Neurology

## 2020-11-30 ENCOUNTER — Ambulatory Visit: Payer: Medicare Other | Admitting: Neurology

## 2020-11-30 VITALS — BP 121/80 | HR 56 | Ht 69.0 in | Wt 214.5 lb

## 2020-11-30 DIAGNOSIS — R569 Unspecified convulsions: Secondary | ICD-10-CM

## 2020-11-30 DIAGNOSIS — G629 Polyneuropathy, unspecified: Secondary | ICD-10-CM | POA: Insufficient documentation

## 2020-11-30 DIAGNOSIS — I63032 Cerebral infarction due to thrombosis of left carotid artery: Secondary | ICD-10-CM | POA: Diagnosis not present

## 2020-11-30 DIAGNOSIS — G6289 Other specified polyneuropathies: Secondary | ICD-10-CM

## 2020-11-30 DIAGNOSIS — R269 Unspecified abnormalities of gait and mobility: Secondary | ICD-10-CM | POA: Diagnosis not present

## 2020-11-30 DIAGNOSIS — R9089 Other abnormal findings on diagnostic imaging of central nervous system: Secondary | ICD-10-CM | POA: Insufficient documentation

## 2020-11-30 HISTORY — DX: Unspecified convulsions: R56.9

## 2020-11-30 HISTORY — DX: Cerebral infarction due to thrombosis of left carotid artery: I63.032

## 2020-11-30 MED ORDER — LEVETIRACETAM 500 MG PO TABS: 500.0000 mg | ORAL_TABLET | Freq: Two times a day (BID) | ORAL | 4 refills | Status: DC

## 2020-11-30 NOTE — Progress Notes (Signed)
Chief Complaint  Patient presents with  . New Patient (Initial Visit)    Rm 16, with wife. ED referral for seizure like activity. Went to [facility] 11/25/20-11/26/20.  10/02/20 he passed out and hit head, broke left elbow. Thought BP went too low, placed on Florinef and BP went too high. Then put on losartan. Been on losartan for about 2 weeks, tolerating well.   . Seizures    This past Wednesday, he was coming down the steps. Was sitting on the stairs and became unresponsive. Became tonic. Did imaging in ER. (CT and cartoid doppler) Found L cartoid almost fully occluded. Has follow up with Dr. Luan Pulling at [place]. Placed on Keppra 500mg  po BID.       ASSESSMENT AND PLAN  Christopher Peck is a 75 y.o. 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 Stanford Breed December 01, 2020, plan for arch angiogram, cerebral angiogram, lower expandable stenting of left common carotid artery stenosis via right common femoral artery,  DIAGNOSTIC DATA (LABS, IMAGING, TESTING) - I reviewed patient records, labs, notes, testing and imaging myself where available also reviewed findings with  Dr. Felecia Shelling.  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 Melford Aase, Gwyndolyn Saxon, for evaluation of seizure, initial evaluation was on November 30, 2020  I reviewed and summarized the referring note. PMHx. Depression, has been treated with Celexa,  Left lung cancer, status post radiation, chemo, radiation fibrosis.  But did not have surgery Orthostatic hypotension HTN,  Hypothyrodism Smoker in the past, 1ppd until 2006,   On October 02, 2020, when he get up after sitting for a while, when he stand up he had a transient loss of consciousness, was evaluated by primary care physician few days later, was noted to have orthostatic hypotension 126/74 heart rate of 77, standing up blood pressure 83/46 heart rate of 57  On November 25, 2020, he came downstairs, noted lightheadedness, he sat down, then had witnessed seizure episode by his wife, arm stretched out, rigid, grunting sound, last for few minutes,   Was evaluated at emergency room on November 25, 2020, MRI of the brain showed left parietal region watershed lesion, radiologist with the 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 Stanford Breed December 01, 2020  REVIEW OF SYSTEMS: Full 14 system review of systems performed and notable only for as above All other review of systems were negative.  PHYSICAL EXAM   Vitals:   11/30/20 1325  BP: 121/80  Pulse: (!) 56  SpO2: 98%  Weight: 214 lb 8 oz (97.3 kg)  Height: 5\' 9"  (1.753 m)   Not recorded     Body mass index is 31.68 kg/m.  PHYSICAL EXAMNIATION:  Gen: NAD, conversant, well nourised, well groomed                     Cardiovascular: Regular rate rhythm, no peripheral edema, warm, nontender. Eyes: Conjunctivae clear without exudates or hemorrhage Neck: Supple, left carotid bruits. Pulmonary: Clear to auscultation bilaterally   NEUROLOGICAL EXAM:  MENTAL STATUS: Short-winded with prolonged talking Speech:    Speech is normal; fluent and spontaneous with normal comprehension.  Cognition:     Orientation to time, place and person     Normal recent and remote memory     Normal Attention span and concentration     Normal Language, naming, repeating,spontaneous speech     Fund of knowledge   CRANIAL NERVES: CN II: Visual fields are full to confrontation. Pupils are round equal and briskly reactive to light. CN III, IV, VI: extraocular movement are normal. No ptosis. CN V: Facial sensation is intact to light touch CN VII: Face is symmetric with normal eye closure  CN VIII: Hearing is normal to causal conversation. CN IX, X: Phonation is normal. CN XI: Head turning and shoulder shrug are intact  MOTOR: There is no pronator drift of out-stretched arms. Muscle bulk and tone are normal. Muscle strength is normal.  REFLEXES: Reflexes are 2+ and symmetric at the biceps, triceps, 3/3 knees, and absent at ankles. Plantar responses are flexor.  SENSORY: Length dependent decreased light touch, pinprick, vibratory sensation to below knee level, decreased toe proprioception  COORDINATION: There is  no trunk or limb dysmetria noted.  GAIT/STANCE: Need push-up to get up from seated position, wide-based, cautious  ALLERGIES: No Known Allergies  HOME MEDICATIONS: Current Outpatient Medications  Medication Sig Dispense Refill  . acetaminophen (TYLENOL) 325 MG tablet Take 325 mg by mouth every 6 (six) hours as needed for mild pain (or headaches).    Marland Kitchen albuterol (ACCUNEB) 1.25 MG/3ML nebulizer solution USE 1 VIAL VIA NEBULIZER EVERY 6 HOURS AS NEEDED FOR WHEEZING (Patient taking differently: Take 1 ampule by nebulization every 6 (six) hours as needed for wheezing or shortness of breath.) 75 mL 20  . albuterol (VENTOLIN HFA) 108 (90 Base) MCG/ACT inhaler Use     2 inhalations    15 minutes apart    every 4 hours     to Rescue Asthma (Patient taking differently: Inhale 2 puffs into the lungs See admin instructions. Inhale 2 puffs into the lungs, 15 minutes apart, every four hours- as needed "to rescue from asthma") 48 g 3  . citalopram (CELEXA) 20 MG tablet TAKE 1 TABLET BY MOUTH DAILY FOR MOOD (Patient taking differently: Take 20 mg by mouth in the morning.) 90 tablet 1  . finasteride (PROSCAR) 5 MG tablet TAKE 1 TABLET BY MOUTH EVERY DAY (Patient taking differently:  Take 5 mg by mouth in the morning.) 90 tablet 1  . fludrocortisone (FLORINEF) 0.1 MG tablet Take 1 tablet (0.1 mg total) by mouth daily. Take  1 to 2 tablets  Daily  as Directed for Low BP (Patient taking differently: Take 0.05 mg by mouth daily as needed (if Systolic number drops below 100).) 180 tablet 0  . levETIRAcetam (KEPPRA) 500 MG tablet Take 1 tablet (500 mg total) by mouth 2 (two) times daily. 60 tablet 0  . levothyroxine (SYNTHROID) 125 MCG tablet Take  1 tablet  Daily on an empty stomach with only water for 30 minutes & no Antacid meds, Calcium or Magnesium for 4 hours & avoid Biotin (Patient taking differently: Take 62.5-125 mcg by mouth See admin instructions. Take 62.5 mcg by mouth with water, 30 minutes before breakfast  in the morning, on Sun/Tues/Thurs/Sat and 125 mcg on Mon/Wed/Fri-  no antacid meds, calcium, or magnesium for 4 hours and avoid biotin) 90 tablet 0  . losartan (COZAAR) 100 MG tablet Take 50 mg by mouth in the morning.    . NON FORMULARY Take 2 tablets by mouth See admin instructions. VitaFusion for men- Chew 2 gummies by mouth in the afternoon- daily     No current facility-administered medications for this visit.    PAST MEDICAL HISTORY: Past Medical History:  Diagnosis Date  . Emphysema of lung ([place])   . History of bilateral inguinal hernia repair 05/11/2009  . Hypertension     PAST SURGICAL HISTORY: Past Surgical History:  Procedure Laterality Date  . CATARACT EXTRACTION, BILATERAL     08/21, 09/21  . HERNIA REPAIR      FAMILY HISTORY: Family History  Problem Relation Age of Onset  . COPD Father   . Kidney failure Father     SOCIAL HISTORY: Social History   Socioeconomic History  . Marital status: Married    Spouse name: Not on file  . Number of children: Not on file  . Years of education: Not on file  . Highest education level: Not on file  Occupational History  . Not on file  Tobacco Use  . Smoking status: Former Smoker    Quit date: 09/05/2004    Years since quitting: 16.2  . Smokeless tobacco: Never Used  Substance and Sexual Activity  . Alcohol use: Not Currently    Alcohol/week: 3.0 standard drinks    Types: 3 Standard drinks or equivalent per week    Comment: none  . Drug use: Never  . Sexual activity: Not on file  Other Topics Concern  . Not on file  Social History Narrative   Right handed   Caffeine use:  1 cup coffee and tea per day   Lives with spouse, Raquel Sarna.   Social Determinants of Health   Financial Resource Strain: Not on file  Food Insecurity: Not on file  Transportation Needs: Not on file  Physical Activity: Not on file  Stress: Not on file  Social Connections: Not on file  Intimate Partner Violence: Not on file      Marcial Pacas,  M.D. Ph.D.  [facility] [address] [place], [place] [address] Ph: [phone] Fax: [phone]  CC:  Emeline Darling, PA-C [address],  [place] [address]  Unk Pinto, MD   Total time spent reviewing the chart, obtaining history, examined patient, ordering tests, documentation, consultations and family, care coordination was 65 minutes

## 2020-11-30 NOTE — Patient Instructions (Signed)
Keep BP  130-150/70-95.  Hold BP medication if less than 130/80

## 2020-12-01 ENCOUNTER — Encounter: Payer: Self-pay | Admitting: Vascular Surgery

## 2020-12-01 ENCOUNTER — Encounter: Payer: Self-pay | Admitting: Neurology

## 2020-12-01 ENCOUNTER — Other Ambulatory Visit: Payer: Self-pay

## 2020-12-01 ENCOUNTER — Ambulatory Visit: Payer: Medicare Other | Admitting: Vascular Surgery

## 2020-12-01 VITALS — BP 110/75 | HR 66 | Temp 98.1°F | Resp 20 | Ht 69.0 in | Wt 216.0 lb

## 2020-12-01 DIAGNOSIS — I6522 Occlusion and stenosis of left carotid artery: Secondary | ICD-10-CM | POA: Diagnosis not present

## 2020-12-01 NOTE — H&P (View-Only) (Signed)
[facility] [place]  ASSESSMENT / PLAN: 75 y.o. male with proximal, severe left common carotid artery stenosis in setting of left hemispheric stroke. Stenosis is likely a consequence of chest radiotherapy for lung cancer. Will plan for arch angiogram, cerebral angiogram, balloon expandable stenting of left common carotid artery stenosis via right common femoral artery access in cath lab 12/09/20. I quoted the patient a small, but real risk of stroke and access site complication from this procedure.   CHIEF COMPLAINT: stroke, syncope. Proximal left carotid stenosis on recent CT.  HISTORY OF PRESENT ILLNESS: Christopher Peck is a 75 y.o. male referred to clinic for evaluation of proximal carotid artery stenosis.  Patient has had difficulty with orthostatic hypotension for some time.  Recently he suffered a syncopal event with some seizure-like activity.  Did presentation to the ER, and a referral to Dr. Krista Blue of neurology.  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  He has a history of radiation therapy to the chest for lung cancer 16 years ago.   Past Medical History:  Diagnosis Date  . Emphysema of lung ([place])   . History of bilateral inguinal hernia repair 05/11/2009  . Hypertension     Past Surgical History:  Procedure Laterality Date  . CATARACT EXTRACTION, BILATERAL     08/21, 09/21  . HERNIA REPAIR      Family History  Problem Relation Age of Onset  . COPD Father   . Kidney failure Father     Social History   Socioeconomic History  . Marital status: Married    Spouse name: Not on file  . Number of children: Not on file  . Years of education: Not on file  . Highest education level: Not on file  Occupational History  . Not on file  Tobacco Use  . Smoking status: Former Smoker    Quit date: 09/05/2004    Years since quitting: 16.2  . Smokeless tobacco: Never Used  Vaping Use  . Vaping Use:  Never used  Substance and Sexual Activity  . Alcohol use: Not Currently    Alcohol/week: 3.0 standard drinks    Types: 3 Standard drinks or equivalent per week    Comment: none  . Drug use: Never  . Sexual activity: Not on file  Other Topics Concern  . Not on file  Social History Narrative   Right handed   Caffeine use:  1 cup coffee and tea per day   Lives with spouse, Raquel Sarna.   Social Determinants of Health   Financial Resource Strain: Not on file  Food Insecurity: Not on file  Transportation Needs: Not on file  Physical Activity: Not on file  Stress: Not on file  Social Connections: Not on file  Intimate Partner Violence: Not on file    No Known Allergies  Current Outpatient Medications  Medication Sig Dispense Refill  . acetaminophen (TYLENOL) 325 MG tablet Take 325 mg by mouth every 6 (six) hours as needed for mild pain (or headaches).    Marland Kitchen albuterol (ACCUNEB) 1.25 MG/3ML nebulizer solution USE 1 VIAL VIA NEBULIZER EVERY 6 HOURS AS NEEDED FOR WHEEZING (Patient taking differently: Take 1 ampule by nebulization every 6 (six) hours as needed for wheezing or shortness of breath.) 75 mL 20  . albuterol (VENTOLIN HFA) 108 (90 Base) MCG/ACT inhaler Use     2 inhalations    15 minutes apart    every 4 hours  to Rescue Asthma (Patient taking differently: Inhale 2 puffs into the lungs See admin instructions. Inhale 2 puffs into the lungs, 15 minutes apart, every four hours- as needed "to rescue from asthma") 48 g 3  . citalopram (CELEXA) 20 MG tablet TAKE 1 TABLET BY MOUTH DAILY FOR MOOD (Patient taking differently: Take 20 mg by mouth in the morning.) 90 tablet 1  . finasteride (PROSCAR) 5 MG tablet TAKE 1 TABLET BY MOUTH EVERY DAY (Patient taking differently: Take 5 mg by mouth in the morning.) 90 tablet 1  . fludrocortisone (FLORINEF) 0.1 MG tablet Take 1 tablet (0.1 mg total) by mouth daily. Take  1 to 2 tablets  Daily  as Directed for Low BP (Patient taking differently: Take 0.05  mg by mouth daily as needed (if Systolic number drops below 100).) 180 tablet 0  . levETIRAcetam (KEPPRA) 500 MG tablet Take 1 tablet (500 mg total) by mouth 2 (two) times daily. 180 tablet 4  . levothyroxine (SYNTHROID) 125 MCG tablet Take  1 tablet  Daily on an empty stomach with only water for 30 minutes & no Antacid meds, Calcium or Magnesium for 4 hours & avoid Biotin (Patient taking differently: Take 62.5-125 mcg by mouth See admin instructions. Take 62.5 mcg by mouth with water, 30 minutes before breakfast in the morning, on Sun/Tues/Thurs/Sat and 125 mcg on Mon/Wed/Fri-  no antacid meds, calcium, or magnesium for 4 hours and avoid biotin) 90 tablet 0  . NON FORMULARY Take 2 tablets by mouth See admin instructions. VitaFusion for men- Chew 2 gummies by mouth in the afternoon- daily    . losartan (COZAAR) 100 MG tablet Take 50 mg by mouth in the morning. (Patient not taking: Reported on 12/01/2020)     No current facility-administered medications for this visit.    REVIEW OF SYSTEMS:  [X]  denotes positive finding, [ ]  denotes negative finding Cardiac  Comments:  Chest pain or chest pressure:    Shortness of breath upon exertion:    Short of breath when lying flat:    Irregular heart rhythm:        Vascular    Pain in calf, thigh, or hip brought on by ambulation:    Pain in feet at night that wakes you up from your sleep:     Blood clot in your veins:    Leg swelling:         Pulmonary    Oxygen at home:    Productive cough:     Wheezing:         Neurologic    Sudden weakness in arms or legs:     Sudden numbness in arms or legs:     Sudden onset of difficulty speaking or slurred speech:    Temporary loss of vision in one eye:     Problems with dizziness:  x       Gastrointestinal    Blood in stool:     Vomited blood:         Genitourinary    Burning when urinating:     Blood in urine:        Psychiatric    Major depression:         Hematologic    Bleeding problems:     Problems with blood clotting too easily:        Skin    Rashes or ulcers:        Constitutional    Fever or chills:  PHYSICAL EXAM  Vitals:   12/01/20 1400  BP: 110/75  Pulse: 66  Resp: 20  Temp: 98.1 F (36.7 C)  SpO2: 100%  Weight: 216 lb (98 kg)  Height: 5\' 9"  (1.753 m)    Constitutional: well appearing. no distress. Appears well nourished.  Neurologic: CN intact. no focal findings. no sensory loss. Psychiatric: Mood and affect symmetric and appropriate. Eyes: No icterus. No conjunctival pallor. Ears, nose, throat: mucous membranes moist. Midline trachea.  Cardiac: regular rate and rhythm.  Respiratory: unlabored. Abdominal: soft, non-tender, non-distended.  Peripheral vascular:  2+ femoral pulses Extremity: No edema. No cyanosis. No pallor.  Skin: No gangrene. No ulceration.  Lymphatic: No Stemmer's sign. No palpable lymphadenopathy.  PERTINENT LABORATORY AND RADIOLOGIC DATA  Most recent CBC CBC Latest Ref Rng & Units 11/25/2020 11/12/2020 10/02/2020  WBC 4.0 - 10.5 K/uL 10.2 7.9 9.7  Hemoglobin 13.0 - 17.0 g/dL 15.4 14.3 14.1  Hematocrit 39.0 - 52.0 % 47.3 43.1 43.0  Platelets 150 - 400 K/uL 249 228 206     Most recent CMP CMP Latest Ref Rng & Units 11/25/2020 11/12/2020 10/02/2020  Glucose 70 - 99 mg/dL 131(H) 99 136(H)  BUN 8 - 23 mg/dL 13 15 12   Creatinine 0.61 - 1.24 mg/dL 1.16 1.03 1.23  Sodium 135 - 145 mmol/L 137 141 138  Potassium 3.5 - 5.1 mmol/L 4.3 3.8 4.6  Chloride 98 - 111 mmol/L 104 104 104  CO2 22 - 32 mmol/L 27 28 25   Calcium 8.9 - 10.3 mg/dL 9.4 8.8 8.8(L)  Total Protein 6.1 - 8.1 g/dL - 6.5 -  Total Bilirubin 0.2 - 1.2 mg/dL - 0.4 -  Alkaline Phos 40 - 115 U/L - - -  AST 10 - 35 U/L - 22 -  ALT 9 - 46 U/L - 18 -    Renal function Estimated Creatinine Clearance: 66.4 mL/min (by C-G formula based on SCr of 1.16 mg/dL).  Hgb A1c MFr Bld (% of total Hgb)  Date Value  06/22/2020 5.4    LDL Cholesterol (Calc)  Date Value Ref  Range Status  10/31/2018 130 (H) mg/dL (calc) Final    Comment:    Reference range: <100 . Desirable range <100 mg/dL for primary prevention;   <70 mg/dL for patients with CHD or diabetic patients  with > or = 2 CHD risk factors. Marland Kitchen LDL-C is now calculated using the Martin-Hopkins  calculation, which is a validated novel method providing  better accuracy than the Friedewald equation in the  estimation of LDL-C.  Cresenciano Genre et al. Annamaria Helling. 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 High-grade stenosis of the left common carotid artery just distal to the origin. Healthy carotid artery measures 8-9 mm immediately distal to the lesion. Lesion appears amenable to balloon expandable stenting.  Yevonne Aline. Stanford Breed, MD [facility] Phone Number: [phone] 12/01/2020 3:59 PM

## 2020-12-01 NOTE — Progress Notes (Signed)
[facility] [place]  ASSESSMENT / PLAN: 75 y.o. male with proximal, severe left common carotid artery stenosis in setting of left hemispheric stroke. Stenosis is likely a consequence of chest radiotherapy for lung cancer. Will plan for arch angiogram, cerebral angiogram, balloon expandable stenting of left common carotid artery stenosis via right common femoral artery access in cath lab 12/09/20. I quoted the patient a small, but real risk of stroke and access site complication from this procedure.   CHIEF COMPLAINT: stroke, syncope. Proximal left carotid stenosis on recent CT.  HISTORY OF PRESENT ILLNESS: Christopher Peck is a 75 y.o. male referred to clinic for evaluation of proximal carotid artery stenosis.  Patient has had difficulty with orthostatic hypotension for some time.  Recently he suffered a syncopal event with some seizure-like activity.  Did presentation to the ER, and a referral to Dr. Krista Blue of neurology.  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  He has a history of radiation therapy to the chest for lung cancer 16 years ago.   Past Medical History:  Diagnosis Date  . Emphysema of lung ([place])   . History of bilateral inguinal hernia repair 05/11/2009  . Hypertension     Past Surgical History:  Procedure Laterality Date  . CATARACT EXTRACTION, BILATERAL     08/21, 09/21  . HERNIA REPAIR      Family History  Problem Relation Age of Onset  . COPD Father   . Kidney failure Father     Social History   Socioeconomic History  . Marital status: Married    Spouse name: Not on file  . Number of children: Not on file  . Years of education: Not on file  . Highest education level: Not on file  Occupational History  . Not on file  Tobacco Use  . Smoking status: Former Smoker    Quit date: 09/05/2004    Years since quitting: 16.2  . Smokeless tobacco: Never Used  Vaping Use  . Vaping Use:  Never used  Substance and Sexual Activity  . Alcohol use: Not Currently    Alcohol/week: 3.0 standard drinks    Types: 3 Standard drinks or equivalent per week    Comment: none  . Drug use: Never  . Sexual activity: Not on file  Other Topics Concern  . Not on file  Social History Narrative   Right handed   Caffeine use:  1 cup coffee and tea per day   Lives with spouse, Raquel Sarna.   Social Determinants of Health   Financial Resource Strain: Not on file  Food Insecurity: Not on file  Transportation Needs: Not on file  Physical Activity: Not on file  Stress: Not on file  Social Connections: Not on file  Intimate Partner Violence: Not on file    No Known Allergies  Current Outpatient Medications  Medication Sig Dispense Refill  . acetaminophen (TYLENOL) 325 MG tablet Take 325 mg by mouth every 6 (six) hours as needed for mild pain (or headaches).    Marland Kitchen albuterol (ACCUNEB) 1.25 MG/3ML nebulizer solution USE 1 VIAL VIA NEBULIZER EVERY 6 HOURS AS NEEDED FOR WHEEZING (Patient taking differently: Take 1 ampule by nebulization every 6 (six) hours as needed for wheezing or shortness of breath.) 75 mL 20  . albuterol (VENTOLIN HFA) 108 (90 Base) MCG/ACT inhaler Use     2 inhalations    15 minutes apart    every 4 hours  to Rescue Asthma (Patient taking differently: Inhale 2 puffs into the lungs See admin instructions. Inhale 2 puffs into the lungs, 15 minutes apart, every four hours- as needed "to rescue from asthma") 48 g 3  . citalopram (CELEXA) 20 MG tablet TAKE 1 TABLET BY MOUTH DAILY FOR MOOD (Patient taking differently: Take 20 mg by mouth in the morning.) 90 tablet 1  . finasteride (PROSCAR) 5 MG tablet TAKE 1 TABLET BY MOUTH EVERY DAY (Patient taking differently: Take 5 mg by mouth in the morning.) 90 tablet 1  . fludrocortisone (FLORINEF) 0.1 MG tablet Take 1 tablet (0.1 mg total) by mouth daily. Take  1 to 2 tablets  Daily  as Directed for Low BP (Patient taking differently: Take 0.05  mg by mouth daily as needed (if Systolic number drops below 100).) 180 tablet 0  . levETIRAcetam (KEPPRA) 500 MG tablet Take 1 tablet (500 mg total) by mouth 2 (two) times daily. 180 tablet 4  . levothyroxine (SYNTHROID) 125 MCG tablet Take  1 tablet  Daily on an empty stomach with only water for 30 minutes & no Antacid meds, Calcium or Magnesium for 4 hours & avoid Biotin (Patient taking differently: Take 62.5-125 mcg by mouth See admin instructions. Take 62.5 mcg by mouth with water, 30 minutes before breakfast in the morning, on Sun/Tues/Thurs/Sat and 125 mcg on Mon/Wed/Fri-  no antacid meds, calcium, or magnesium for 4 hours and avoid biotin) 90 tablet 0  . NON FORMULARY Take 2 tablets by mouth See admin instructions. VitaFusion for men- Chew 2 gummies by mouth in the afternoon- daily    . losartan (COZAAR) 100 MG tablet Take 50 mg by mouth in the morning. (Patient not taking: Reported on 12/01/2020)     No current facility-administered medications for this visit.    REVIEW OF SYSTEMS:  [X]  denotes positive finding, [ ]  denotes negative finding Cardiac  Comments:  Chest pain or chest pressure:    Shortness of breath upon exertion:    Short of breath when lying flat:    Irregular heart rhythm:        Vascular    Pain in calf, thigh, or hip brought on by ambulation:    Pain in feet at night that wakes you up from your sleep:     Blood clot in your veins:    Leg swelling:         Pulmonary    Oxygen at home:    Productive cough:     Wheezing:         Neurologic    Sudden weakness in arms or legs:     Sudden numbness in arms or legs:     Sudden onset of difficulty speaking or slurred speech:    Temporary loss of vision in one eye:     Problems with dizziness:  x       Gastrointestinal    Blood in stool:     Vomited blood:         Genitourinary    Burning when urinating:     Blood in urine:        Psychiatric    Major depression:         Hematologic    Bleeding problems:     Problems with blood clotting too easily:        Skin    Rashes or ulcers:        Constitutional    Fever or chills:  PHYSICAL EXAM  Vitals:   12/01/20 1400  BP: 110/75  Pulse: 66  Resp: 20  Temp: 98.1 F (36.7 C)  SpO2: 100%  Weight: 216 lb (98 kg)  Height: 5\' 9"  (1.753 m)    Constitutional: well appearing. no distress. Appears well nourished.  Neurologic: CN intact. no focal findings. no sensory loss. Psychiatric: Mood and affect symmetric and appropriate. Eyes: No icterus. No conjunctival pallor. Ears, nose, throat: mucous membranes moist. Midline trachea.  Cardiac: regular rate and rhythm.  Respiratory: unlabored. Abdominal: soft, non-tender, non-distended.  Peripheral vascular:  2+ femoral pulses Extremity: No edema. No cyanosis. No pallor.  Skin: No gangrene. No ulceration.  Lymphatic: No Stemmer's sign. No palpable lymphadenopathy.  PERTINENT LABORATORY AND RADIOLOGIC DATA  Most recent CBC CBC Latest Ref Rng & Units 11/25/2020 11/12/2020 10/02/2020  WBC 4.0 - 10.5 K/uL 10.2 7.9 9.7  Hemoglobin 13.0 - 17.0 g/dL 15.4 14.3 14.1  Hematocrit 39.0 - 52.0 % 47.3 43.1 43.0  Platelets 150 - 400 K/uL 249 228 206     Most recent CMP CMP Latest Ref Rng & Units 11/25/2020 11/12/2020 10/02/2020  Glucose 70 - 99 mg/dL 131(H) 99 136(H)  BUN 8 - 23 mg/dL 13 15 12   Creatinine 0.61 - 1.24 mg/dL 1.16 1.03 1.23  Sodium 135 - 145 mmol/L 137 141 138  Potassium 3.5 - 5.1 mmol/L 4.3 3.8 4.6  Chloride 98 - 111 mmol/L 104 104 104  CO2 22 - 32 mmol/L 27 28 25   Calcium 8.9 - 10.3 mg/dL 9.4 8.8 8.8(L)  Total Protein 6.1 - 8.1 g/dL - 6.5 -  Total Bilirubin 0.2 - 1.2 mg/dL - 0.4 -  Alkaline Phos 40 - 115 U/L - - -  AST 10 - 35 U/L - 22 -  ALT 9 - 46 U/L - 18 -    Renal function Estimated Creatinine Clearance: 66.4 mL/min (by C-G formula based on SCr of 1.16 mg/dL).  Hgb A1c MFr Bld (% of total Hgb)  Date Value  06/22/2020 5.4    LDL Cholesterol (Calc)  Date Value Ref  Range Status  10/31/2018 130 (H) mg/dL (calc) Final    Comment:    Reference range: <100 . Desirable range <100 mg/dL for primary prevention;   <70 mg/dL for patients with CHD or diabetic patients  with > or = 2 CHD risk factors. Marland Kitchen LDL-C is now calculated using the Martin-Hopkins  calculation, which is a validated novel method providing  better accuracy than the Friedewald equation in the  estimation of LDL-C.  Cresenciano Genre et al. Annamaria Helling. 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 High-grade stenosis of the left common carotid artery just distal to the origin. Healthy carotid artery measures 8-9 mm immediately distal to the lesion. Lesion appears amenable to balloon expandable stenting.  Yevonne Aline. Stanford Breed, MD [facility] Phone Number: [phone] 12/01/2020 3:59 PM

## 2020-12-02 ENCOUNTER — Other Ambulatory Visit: Payer: Self-pay

## 2020-12-07 ENCOUNTER — Other Ambulatory Visit (HOSPITAL_COMMUNITY)
Admission: RE | Admit: 2020-12-07 | Discharge: 2020-12-07 | Disposition: A | Discharge status: Home / Self Care | Payer: Medicare Other | Source: Ambulatory Visit | Attending: Vascular Surgery | Admitting: Vascular Surgery

## 2020-12-07 DIAGNOSIS — Z01812 Encounter for preprocedural laboratory examination: Secondary | ICD-10-CM | POA: Insufficient documentation

## 2020-12-07 DIAGNOSIS — Z20822 Contact with and (suspected) exposure to covid-19: Secondary | ICD-10-CM | POA: Insufficient documentation

## 2020-12-08 LAB — SARS CORONAVIRUS 2 (TAT 6-24 HRS): SARS Coronavirus 2: NEGATIVE

## 2020-12-09 ENCOUNTER — Other Ambulatory Visit: Payer: Self-pay

## 2020-12-09 ENCOUNTER — Other Ambulatory Visit (HOSPITAL_COMMUNITY): Payer: Self-pay

## 2020-12-09 ENCOUNTER — Ambulatory Visit (HOSPITAL_COMMUNITY)
Admission: RE | Admit: 2020-12-09 | Discharge: 2020-12-09 | Disposition: A | Discharge status: Home / Self Care | Payer: Medicare Other | Attending: Vascular Surgery | Admitting: Vascular Surgery

## 2020-12-09 ENCOUNTER — Ambulatory Visit (HOSPITAL_COMMUNITY)
Admission: RE | Disposition: A | Discharge status: Home / Self Care | Payer: Self-pay | Source: Home / Self Care | Attending: Vascular Surgery

## 2020-12-09 DIAGNOSIS — Z79890 Hormone replacement therapy (postmenopausal): Secondary | ICD-10-CM | POA: Insufficient documentation

## 2020-12-09 DIAGNOSIS — Z87891 Personal history of nicotine dependence: Secondary | ICD-10-CM | POA: Insufficient documentation

## 2020-12-09 DIAGNOSIS — Z79899 Other long term (current) drug therapy: Secondary | ICD-10-CM | POA: Diagnosis not present

## 2020-12-09 DIAGNOSIS — I6522 Occlusion and stenosis of left carotid artery: Secondary | ICD-10-CM | POA: Diagnosis not present

## 2020-12-09 HISTORY — PX: AORTIC ARCH ANGIOGRAPHY: CATH118224

## 2020-12-09 HISTORY — PX: PERIPHERAL VASCULAR INTERVENTION: CATH118257

## 2020-12-09 LAB — POCT I-STAT, CHEM 8
BUN: 17 mg/dL (ref 8–23)
Calcium, Ion: 1.28 mmol/L (ref 1.15–1.40)
Chloride: 102 mmol/L (ref 98–111)
Creatinine, Ser: 1.1 mg/dL (ref 0.61–1.24)
Glucose, Bld: 91 mg/dL (ref 70–99)
HCT: 45 % (ref 39.0–52.0)
Hemoglobin: 15.3 g/dL (ref 13.0–17.0)
Potassium: 4.1 mmol/L (ref 3.5–5.1)
Sodium: 143 mmol/L (ref 135–145)
TCO2: 29 mmol/L (ref 22–32)

## 2020-12-09 LAB — POCT ACTIVATED CLOTTING TIME: Activated Clotting Time: 321 seconds

## 2020-12-09 SURGERY — AORTIC ARCH ANGIOGRAPHY
Anesthesia: LOCAL

## 2020-12-09 MED ORDER — ACETAMINOPHEN 325 MG PO TABS: 650.0000 mg | ORAL_TABLET | ORAL | Status: DC | PRN

## 2020-12-09 MED ORDER — IODIXANOL 320 MG/ML IV SOLN
INTRAVENOUS | Status: DC | PRN
  Administered 2020-12-09: 135 mL

## 2020-12-09 MED ORDER — HEPARIN SODIUM (PORCINE) 1000 UNIT/ML IJ SOLN
INTRAMUSCULAR | Status: AC
  Filled 2020-12-09: qty 1

## 2020-12-09 MED ORDER — ASPIRIN 325 MG PO TABS
ORAL_TABLET | ORAL | Status: AC
  Filled 2020-12-09: qty 1

## 2020-12-09 MED ORDER — ASPIRIN 325 MG PO TABS
ORAL_TABLET | ORAL | Status: DC | PRN
  Administered 2020-12-09: 325 mg via ORAL

## 2020-12-09 MED ORDER — SODIUM CHLORIDE 0.9 % IV SOLN: INTRAVENOUS | Status: DC

## 2020-12-09 MED ORDER — EPINEPHRINE PF 1 MG/ML IJ SOLN
INTRAMUSCULAR | Status: AC
  Filled 2020-12-09: qty 1

## 2020-12-09 MED ORDER — LABETALOL HCL 5 MG/ML IV SOLN: 10.0000 mg | INTRAVENOUS | Status: DC | PRN

## 2020-12-09 MED ORDER — SODIUM CHLORIDE 0.9% FLUSH: 3.0000 mL | INTRAVENOUS | Status: DC | PRN

## 2020-12-09 MED ORDER — HEPARIN (PORCINE) IN NACL 1000-0.9 UT/500ML-% IV SOLN
INTRAVENOUS | Status: DC | PRN
  Administered 2020-12-09: 500 mL

## 2020-12-09 MED ORDER — HEPARIN SODIUM (PORCINE) 1000 UNIT/ML IJ SOLN
INTRAMUSCULAR | Status: AC
  Filled 2020-12-09: qty 2

## 2020-12-09 MED ORDER — ASPIRIN 81 MG PO TBEC
81.0000 mg | DELAYED_RELEASE_TABLET | Freq: Every day | ORAL | 2 refills | Status: AC
  Filled 2020-12-09: qty 30, 30d supply, fill #0

## 2020-12-09 MED ORDER — CLOPIDOGREL BISULFATE 75 MG PO TABS: 300.0000 mg | ORAL_TABLET | Freq: Once | ORAL | Status: DC

## 2020-12-09 MED ORDER — CLOPIDOGREL BISULFATE 75 MG PO TABS: 75.0000 mg | ORAL_TABLET | Freq: Every day | ORAL | Status: DC

## 2020-12-09 MED ORDER — ONDANSETRON HCL 4 MG/2ML IJ SOLN: 4.0000 mg | Freq: Four times a day (QID) | INTRAMUSCULAR | Status: DC | PRN

## 2020-12-09 MED ORDER — CLOPIDOGREL BISULFATE 75 MG PO TABS
75.0000 mg | ORAL_TABLET | Freq: Every day | ORAL | 11 refills | Status: AC
  Filled 2020-12-09: qty 30, 30d supply, fill #0

## 2020-12-09 MED ORDER — HEPARIN SODIUM (PORCINE) 1000 UNIT/ML IJ SOLN
INTRAMUSCULAR | Status: DC | PRN
  Administered 2020-12-09: 10000 [IU] via INTRAVENOUS

## 2020-12-09 MED ORDER — SODIUM CHLORIDE 0.9 % WEIGHT BASED INFUSION: 1.0000 mL/kg/h | INTRAVENOUS | Status: DC

## 2020-12-09 MED ORDER — CLOPIDOGREL BISULFATE 300 MG PO TABS
ORAL_TABLET | ORAL | Status: AC
  Filled 2020-12-09: qty 1

## 2020-12-09 MED ORDER — LIDOCAINE-EPINEPHRINE 1 %-1:100000 IJ SOLN
INTRAMUSCULAR | Status: AC
  Filled 2020-12-09: qty 1

## 2020-12-09 MED ORDER — SODIUM CHLORIDE 0.9% FLUSH: 3.0000 mL | Freq: Two times a day (BID) | INTRAVENOUS | Status: DC

## 2020-12-09 MED ORDER — HYDRALAZINE HCL 20 MG/ML IJ SOLN: 5.0000 mg | INTRAMUSCULAR | Status: DC | PRN

## 2020-12-09 MED ORDER — CLOPIDOGREL BISULFATE 75 MG PO TABS: 75.0000 mg | ORAL_TABLET | Freq: Every day | ORAL | 11 refills | Status: DC

## 2020-12-09 MED ORDER — ASPIRIN EC 325 MG PO TBEC: 325.0000 mg | DELAYED_RELEASE_TABLET | Freq: Every day | ORAL | Status: DC

## 2020-12-09 MED ORDER — LIDOCAINE HCL (PF) 1 % IJ SOLN
INTRAMUSCULAR | Status: AC
  Filled 2020-12-09: qty 30

## 2020-12-09 MED ORDER — LIDOCAINE HCL (PF) 1 % IJ SOLN
INTRAMUSCULAR | Status: DC | PRN
  Administered 2020-12-09: 10 mL
  Administered 2020-12-09: 18 mL
  Administered 2020-12-09: 15 mL

## 2020-12-09 MED ORDER — ASPIRIN EC 81 MG PO TBEC: 81.0000 mg | DELAYED_RELEASE_TABLET | Freq: Every day | ORAL | 2 refills | Status: DC

## 2020-12-09 MED ORDER — LIDOCAINE-EPINEPHRINE 1 %-1:100000 IJ SOLN
INTRAMUSCULAR | Status: DC | PRN
  Administered 2020-12-09: 2 mL

## 2020-12-09 MED ORDER — CLOPIDOGREL BISULFATE 300 MG PO TABS
ORAL_TABLET | ORAL | Status: DC | PRN
  Administered 2020-12-09: 300 mg via ORAL

## 2020-12-09 MED ORDER — SODIUM CHLORIDE 0.9 % IV SOLN: 250.0000 mL | INTRAVENOUS | Status: DC | PRN

## 2020-12-09 SURGICAL SUPPLY — 18 items
BALLN MUSTANG 7X20X135 (BALLOONS) ×3
BALLOON MUSTANG 7X20X135 (BALLOONS) IMPLANT
CATH ANGIO 5F BER 100CM (CATHETERS) ×2 IMPLANT
CATH ANGIO 5F PIGTAIL 100CM (CATHETERS) ×1 IMPLANT
CATH ANGIO 5F SIM1 100CM (CATHETERS) ×1 IMPLANT
CLOSURE PERCLOSE PROSTYLE (VASCULAR PRODUCTS) ×1 IMPLANT
GLIDEWIRE ADV .035X260CM (WIRE) ×2 IMPLANT
KIT ENCORE 26 ADVANTAGE (KITS) ×2 IMPLANT
KIT MICROPUNCTURE NIT STIFF (SHEATH) ×1 IMPLANT
KIT PV (KITS) ×3 IMPLANT
SHEATH DESTINATION 7FR 90 (SHEATH) ×1 IMPLANT
SHEATH GUIDING CAROTID 6FRX90 (SHEATH) IMPLANT
SHEATH PINNACLE 5F 10CM (SHEATH) ×2 IMPLANT
SHEATH PROBE COVER 6X72 (BAG) ×1 IMPLANT
STENT VIABAHN 6X19X135 VBX (Permanent Stent) ×1 IMPLANT
TRANSDUCER W/STOPCOCK (MISCELLANEOUS) ×3 IMPLANT
TRAY PV CATH (CUSTOM PROCEDURE TRAY) ×3 IMPLANT
WIRE ROSEN-J .035X260CM (WIRE) ×1 IMPLANT

## 2020-12-09 NOTE — Progress Notes (Signed)
Discharge instruction given per MD order to patient and wife.  RX brought up to patient from pharmacy.  Pt and wife able to verbalize instruction.

## 2020-12-09 NOTE — Interval H&P Note (Signed)
History and Physical Interval Note:  12/09/2020 12:46 PM  Christopher Peck  has presented today for surgery, with the diagnosis of left artery stenosis.  The various methods of treatment have been discussed with the patient and family. After consideration of risks, benefits and other options for treatment, the patient has consented to  Procedure(s): AORTIC ARCH ANGIOGRAPHY (N/A) as a surgical intervention.  The patient's history has been reviewed, patient examined, no change in status, stable for surgery.  I have reviewed the patient's chart and labs.  Questions were answered to the patient's satisfaction.     Cherre Robins

## 2020-12-09 NOTE — Discharge Instructions (Signed)
Angiogram, Care After This sheet gives you information about how to care for yourself after your procedure. Your health care provider may also give you more specific instructions. If you have problems or questions, contact your health care provider. What can I expect after the procedure? After the procedure, it is common to have:  Bruising and tenderness at the catheter insertion area.  A collection of blood (hematoma) at the insertion area. This may feel like a small lump under the skin at the insertion site. Follow these instructions at home: Insertion site care  Follow instructions from your health care provider about how to take care of your insertion site. Make sure you: ? Wash your hands with soap and water before and after you change your bandage (dressing). If soap and water are not available, use hand sanitizer. ? Change your dressing as told by your health care provider.  Do not take baths, swim, or use a hot tub until your health care provider approves.  You may shower 24-48 hours after the procedure, or as told by your health care provider. To clean the insertion site: ? Gently wash the area with plain soap and water. ? Pat the area dry with a clean towel. ? Do not rub the site. This may cause bleeding.  Check your insertion site every day for signs of infection. Check for: ? Redness, swelling, or pain. ? Fluid or blood. ? Warmth. ? Pus or a bad smell.  Do not apply powder or lotion to the site. Keep the site clean and dry.   Activity  Do not drive for 24 hours if you were given a sedative during your procedure.  Rest as told by your health care provider, usually for 1-2 days.  Do not lift anything that is heavier than 10 lb (4.5 kg), or the limit that you are told, until your health care provider says that it is safe.  If the insertion site was in your leg, try to avoid stairs for a few days.  Return to your normal activities as told by your health care provider,  usually in about a week. Ask your health care provider what activities are safe for you. General instructions  If your insertion site starts bleeding, lie flat and put pressure on the site. If the bleeding does not stop, get help right away. This is a medical emergency.  Take over-the-counter and prescription medicines only as told by your health care provider.  Drink enough fluid to keep your urine pale yellow. This helps flush the contrast dye from your body.  Keep all follow-up visits as told by your health care provider. This is important.   Contact a health care provider if:  You have a fever or chills.  You have redness, swelling, or pain around your insertion site.  You have fluid or blood coming from your insertion site.  Your insertion site feels warm to the touch.  You have pus or a bad smell coming from your insertion site.  You have more bruising around the insertion site. Get help right away if you have:  A problem with the insertion area, such as: ? The area swells fast or bleeds even after you apply pressure. ? The area becomes pale, cool, tingly, or numb.  Chest pain.  Trouble breathing.  A rash.  Any symptoms of a stroke. "BE FAST" is an easy way to remember the main warning signs of a stroke: ? B - Balance. Signs are dizziness, sudden trouble walking,   or loss of balance. ? E - Eyes. Signs are trouble seeing or a sudden change in vision. ? F - Face. Signs are sudden weakness or loss of feeling of the face, or the face or eyelid drooping on one side. ? A - Arms. Signs are weakness or loss of feeling in an arm. This happens suddenly and usually on one side of the body. ? S - Speech. Signs are sudden trouble speaking, slurred speech, or trouble understanding what people say. ? T - Time. Time to call emergency services. Write down what time symptoms started.  You have other signs of a stroke, such as: ? A sudden, severe headache with no known cause. ? Nausea  or vomiting. ? Seizure. These symptoms may represent a serious problem that is an emergency. Do not wait to see if the symptoms will go away. Get medical help right away. Call your local emergency services (911 in the [place] Do not drive yourself to the hospital. Summary  It is common to have bruising and tenderness at the catheter insertion area.  Do not take baths, swim, or use a hot tub until your health care provider approves. You may shower 24-48 hours after the procedure or as told.  It is important to rest and drink plenty of fluids.  If the insertion site bleeds, lie flat and put pressure on the site. If the bleeding continues, get help right away. This is a medical emergency. This information is not intended to replace advice given to you by your health care provider. Make sure you discuss any questions you have with your health care provider. Document Revised: 06/26/2019 Document Reviewed: 06/26/2019 Elsevier Patient Education  2021 [employer]  

## 2020-12-09 NOTE — Op Note (Signed)
DATE OF SERVICE: 12/09/2020  PATIENT:  Christopher Peck  75 y.o. 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  TOURNIQUET:  None  PATIENT DISPOSITION:  PACU - hemodynamically stable.   Delay start of Pharmacological VTE agent (>24hrs) due to surgical blood loss or risk of bleeding: no  INDICATION FOR PROCEDURE: Christopher Peck is a 75 y.o. 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 After careful discussion of risks, benefits, and alternatives the patient was offered arch angiography and proximal common carotid artery stenting. We specifically discussed access site complications and risk of stroke (quoted <4%). The patient understood and wished to proceed.  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 The patient was positioned supine on the operating room table. Anesthesia was induced. The groins was prepped and draped in standard fashion. A surgical pause was performed confirming correct patient, procedure, and operative location.  The right  groin was anesthetized with subcutaneous injection of 1% lidocaine. Using ultrasound guidance, the right common femoral artery was accessed with micropuncture technique. Fluoroscopy was used to confirm cannulation over the femoral head. Sheathogram was not performed. The 31F sheath was upsized to 62F.   With 10,000 units of IV heparin.  Activated clotting time measurements were used to confirm adequate anticoagulation.  After anticoagulating patient, an Leland advantage was advanced into the ascending aorta.  Over the wire pigtail catheter was advanced into the ascending aorta.  An arch aortogram was performed in a steep LAO projection.  This revealed severe left common carotid artery stenosis near the origin as demonstrated on the CT scan.  Using a Berenstein catheter and Glidewire advantage I was able to select the left common carotid artery and track the Berenstein catheter into the common carotid artery just distal to the stenosis.  Through the catheter I exchanged wires for a 035 Rosen wire.  I then withdrew the catheter.  The 5 French sheath was exchanged for a 7 x 90 cm sheath advanced through the stenosis.  I then introduced a 6 x 19 mm VBX balloon expandable stent across the stenosis.  After several confirmatory angiograms through the sheath in the arch of the aorta, the stent was deployed in standard fashion.  Throughout instrumentation of the arch and common carotid artery we investigated the patient's neurologic status by having him squeeze a squeeze toy with his right hand.  He suffered no neurologic symptoms throughout the procedure.  A final confirmatory angiogram was performed and revealed excellent technical result.    A Perclose device was used to close the arteriotomy. Hemostasis was excellent upon completion.  Upon completion of  the case instrument and sharps counts were confirmed correct. The patient was transferred to the PACU in good condition. I was present for all portions of  the procedure.  PLAN: Start dual antiplatelet therapy.  Must continue for at least 3 months (03/10/2021 end date).  Follow-up with me in 1 month with CTA.  No need for statin therapy because disease is radiation mediated, not atherosclerotic.  Christopher Peck. Christopher Breed, MD [facility] Phone Number: [phone] 12/09/2020 2:03 PM

## 2020-12-10 ENCOUNTER — Encounter (HOSPITAL_COMMUNITY): Payer: Self-pay | Admitting: Vascular Surgery

## 2020-12-10 MED FILL — Heparin Sodium (Porcine) Inj 1000 Unit/ML: INTRAMUSCULAR | Qty: 10 | Status: AC

## 2020-12-14 ENCOUNTER — Encounter (HOSPITAL_COMMUNITY): Payer: Self-pay | Admitting: Vascular Surgery

## 2020-12-16 ENCOUNTER — Other Ambulatory Visit: Payer: Self-pay

## 2020-12-16 DIAGNOSIS — I6522 Occlusion and stenosis of left carotid artery: Secondary | ICD-10-CM

## 2020-12-23 ENCOUNTER — Ambulatory Visit: Payer: Medicare Other | Admitting: Neurology

## 2020-12-23 DIAGNOSIS — R569 Unspecified convulsions: Secondary | ICD-10-CM

## 2020-12-24 ENCOUNTER — Ambulatory Visit: Payer: Medicare Other | Admitting: Adult Health Nurse Practitioner

## 2020-12-31 ENCOUNTER — Other Ambulatory Visit: Payer: Self-pay | Admitting: Internal Medicine

## 2021-01-11 ENCOUNTER — Ambulatory Visit
Admission: RE | Admit: 2021-01-11 | Discharge: 2021-01-11 | Disposition: A | Discharge status: Home / Self Care | Payer: Medicare Other | Source: Ambulatory Visit | Attending: Vascular Surgery | Admitting: Vascular Surgery

## 2021-01-11 DIAGNOSIS — I708 Atherosclerosis of other arteries: Secondary | ICD-10-CM | POA: Diagnosis not present

## 2021-01-11 DIAGNOSIS — Z85118 Personal history of other malignant neoplasm of bronchus and lung: Secondary | ICD-10-CM | POA: Diagnosis not present

## 2021-01-11 DIAGNOSIS — I6522 Occlusion and stenosis of left carotid artery: Secondary | ICD-10-CM

## 2021-01-11 DIAGNOSIS — I672 Cerebral atherosclerosis: Secondary | ICD-10-CM | POA: Diagnosis not present

## 2021-01-11 DIAGNOSIS — I6523 Occlusion and stenosis of bilateral carotid arteries: Secondary | ICD-10-CM | POA: Diagnosis not present

## 2021-01-11 MED ORDER — IOPAMIDOL (ISOVUE-370) INJECTION 76%
75.0000 mL | Freq: Once | INTRAVENOUS | Status: AC | PRN
  Administered 2021-01-11: 75 mL via INTRAVENOUS

## 2021-01-12 ENCOUNTER — Other Ambulatory Visit: Payer: Self-pay

## 2021-01-12 ENCOUNTER — Encounter: Payer: Self-pay | Admitting: Vascular Surgery

## 2021-01-12 ENCOUNTER — Ambulatory Visit (INDEPENDENT_AMBULATORY_CARE_PROVIDER_SITE_OTHER): Payer: Self-pay | Admitting: Vascular Surgery

## 2021-01-12 VITALS — BP 123/81 | HR 58 | Temp 98.4°F | Resp 20 | Ht 69.0 in | Wt 216.0 lb

## 2021-01-12 DIAGNOSIS — I6522 Occlusion and stenosis of left carotid artery: Secondary | ICD-10-CM

## 2021-01-12 NOTE — Progress Notes (Signed)
[facility] [place]  ASSESSMENT / PLAN: 75 y.o. male with proximal, severe left common carotid artery stenosis status post balloon expandable covered stenting 12/09/20. He tolerated this well.  His symptoms of disequilibrium have improved.  Continue aspirin, Plavix, statin therapy.  He should continue Plavix for at least 3 months (to 03/10/2021), but ideally 1 year (12/09/2021).  Follow-up with me in 1 year with repeat CT scan of neck.  If no in-stent stenosis identified, will extend surveillance.  CHIEF COMPLAINT: stroke, syncope. Proximal left carotid stenosis on recent CT.  HISTORY OF PRESENT ILLNESS: Christopher Peck is a 75 y.o. male referred to clinic for evaluation of proximal carotid artery stenosis.  Patient has had difficulty with orthostatic hypotension for some time.  Recently he suffered a syncopal event with some seizure-like activity.  Did presentation to the ER, and a referral to Dr. Krista Blue of neurology.  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  He has a history of radiation therapy to the chest for lung cancer 16 years ago.   01/12/2021: Returns after left common carotid artery stenting 12/09/2020.  He tolerated this very well.  He reports his dizzy spells have improved significantly.  He has had no focal symptoms.  Past Medical History:  Diagnosis Date  . Emphysema of lung ([place])   . History of bilateral inguinal hernia repair 05/11/2009  . Hypertension     Past Surgical History:  Procedure Laterality Date  . AORTIC ARCH ANGIOGRAPHY N/A 12/09/2020   Procedure: AORTIC ARCH ANGIOGRAPHY;  Surgeon: Cherre Robins, MD;  Location: [facility];  Service: Cardiovascular;  Laterality: N/A;  . CATARACT EXTRACTION, BILATERAL     08/21, 09/21  . HERNIA REPAIR    . 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   Procedure: PERIPHERAL VASCULAR INTERVENTION;  Surgeon: Cherre Robins, MD;  Location: [facility];  Service: Cardiovascular;  Laterality: Left;  LCCA    Family History  Problem Relation Age of Onset  . COPD Father   . Kidney failure Father     Social History   Socioeconomic History  . Marital status: Married    Spouse name: Not on file  . Number of children: Not on file  . Years of education: Not on file  . Highest education level: Not on file  Occupational History  . Not on file  Tobacco Use  . Smoking status: Former Smoker    Quit date: 09/05/2004    Years since quitting: 16.3  . Smokeless tobacco: Never Used  Vaping Use  . Vaping Use: Never used  Substance and Sexual Activity  . Alcohol use: Not Currently    Alcohol/week: 3.0 standard drinks    Types: 3 Standard drinks or equivalent per week    Comment: none  . Drug use: Never  . Sexual activity: Not on file  Other Topics Concern  . Not on file  Social History Narrative   Right handed   Caffeine use:  1 cup coffee and tea per day   Lives with spouse, Christopher Peck.   Social Determinants of Health   Financial Resource Strain: Not on file  Food Insecurity: Not on file  Transportation Needs: Not on file  Physical Activity: Not on file  Stress: Not on file  Social Connections: Not on file  Intimate Partner Violence: Not on file    No Known Allergies  Current Outpatient Medications  Medication Sig Dispense Refill  .  acetaminophen (TYLENOL) 325 MG tablet Take 325 mg by mouth every 6 (six) hours as needed for mild pain (or headaches).    Marland Kitchen albuterol (ACCUNEB) 1.25 MG/3ML nebulizer solution USE 1 VIAL VIA NEBULIZER EVERY 6 HOURS AS NEEDED FOR WHEEZING (Patient taking differently: Take 1 ampule by nebulization every 6 (six) hours as needed for wheezing or shortness of breath.) 75 mL 20  . albuterol (VENTOLIN HFA) 108 (90 Base) MCG/ACT inhaler Use     2 inhalations    15 minutes apart    every 4 hours     to Rescue Asthma (Patient taking differently: Inhale 2 puffs into the lungs See admin instructions.  Inhale 2 puffs into the lungs, 15 minutes apart, every four hours- as needed "to rescue from asthma") 48 g 3  . aspirin 81 MG EC tablet Take 1 tablet (81 mg total) by mouth daily. 150 tablet 2  . citalopram (CELEXA) 20 MG tablet TAKE 1 TABLET BY MOUTH DAILY FOR MOOD (Patient taking differently: Take 20 mg by mouth in the morning.) 90 tablet 1  . clopidogrel (PLAVIX) 75 MG tablet Take 1 tablet (75 mg total) by mouth daily. 30 tablet 11  . finasteride (PROSCAR) 5 MG tablet TAKE 1 TABLET BY MOUTH EVERY DAY (Patient taking differently: Take 5 mg by mouth in the morning.) 90 tablet 1  . fludrocortisone (FLORINEF) 0.1 MG tablet Take 1 tablet (0.1 mg total) by mouth daily. Take  1 to 2 tablets  Daily  as Directed for Low BP (Patient taking differently: Take 0.05 mg by mouth daily as needed (if Systolic number drops below 100).) 180 tablet 0  . levETIRAcetam (KEPPRA) 500 MG tablet Take 1 tablet (500 mg total) by mouth 2 (two) times daily. 180 tablet 4  . levothyroxine (SYNTHROID) 125 MCG tablet Take 1/2-1 TAB DAILY AS DIRECTED ON AN EMPTY STOMACH WITH ONLY WATER FOR 30 MINUTES & NO ANTACID MEDS, CALCIUM OR MAGNESIUM FOR 4 HOURS & AVOID BIOTIN 90 tablet 1  . losartan (COZAAR) 100 MG tablet Take 50 mg by mouth in the morning.    . NON FORMULARY Take 2 tablets by mouth See admin instructions. VitaFusion for men- Chew 2 gummies by mouth in the afternoon- daily     No current facility-administered medications for this visit.    REVIEW OF SYSTEMS:  [X]  denotes positive finding, [ ]  denotes negative finding Cardiac  Comments:  Chest pain or chest pressure:    Shortness of breath upon exertion:    Short of breath when lying flat:    Irregular heart rhythm:        Vascular    Pain in calf, thigh, or hip brought on by ambulation:    Pain in feet at night that wakes you up from your sleep:     Blood clot in your veins:    Leg swelling:         Pulmonary    Oxygen at home:    Productive cough:      Wheezing:         Neurologic    Sudden weakness in arms or legs:     Sudden numbness in arms or legs:     Sudden onset of difficulty speaking or slurred speech:    Temporary loss of vision in one eye:     Problems with dizziness:  x       Gastrointestinal    Blood in stool:     Vomited blood:  Genitourinary    Burning when urinating:     Blood in urine:        Psychiatric    Major depression:         Hematologic    Bleeding problems:    Problems with blood clotting too easily:        Skin    Rashes or ulcers:        Constitutional    Fever or chills:      PHYSICAL EXAM  Vitals:   01/12/21 1427  BP: 123/81  Pulse: (!) 58  Resp: 20  Temp: 98.4 F (36.9 C)  SpO2: 99%  Weight: 216 lb (98 kg)  Height: 5\' 9"  (1.753 m)    Constitutional: well appearing. no distress. Appears well nourished.  Neurologic: CN intact. no focal findings. no sensory loss. Psychiatric: Mood and affect symmetric and appropriate. Eyes: No icterus. No conjunctival pallor. Ears, nose, throat: mucous membranes moist. Midline trachea.  Cardiac: regular rate and rhythm.  Respiratory: unlabored. Abdominal: soft, non-tender, non-distended.  Peripheral vascular:  2+ femoral pulses Extremity: No edema. No cyanosis. No pallor.  Skin: No gangrene. No ulceration.  Lymphatic: No Stemmer's sign. No palpable lymphadenopathy.  PERTINENT LABORATORY AND RADIOLOGIC DATA  Most recent CBC CBC Latest Ref Rng & Units 12/09/2020 11/25/2020 11/12/2020  WBC 4.0 - 10.5 K/uL - 10.2 7.9  Hemoglobin 13.0 - 17.0 g/dL 15.3 15.4 14.3  Hematocrit 39.0 - 52.0 % 45.0 47.3 43.1  Platelets 150 - 400 K/uL - 249 228     Most recent CMP CMP Latest Ref Rng & Units 12/09/2020 11/25/2020 11/12/2020  Glucose 70 - 99 mg/dL 91 131(H) 99  BUN 8 - 23 mg/dL 17 13 15   Creatinine 0.61 - 1.24 mg/dL 1.10 1.16 1.03  Sodium 135 - 145 mmol/L 143 137 141  Potassium 3.5 - 5.1 mmol/L 4.1 4.3 3.8  Chloride 98 - 111 mmol/L 102 104 104   CO2 22 - 32 mmol/L - 27 28  Calcium 8.9 - 10.3 mg/dL - 9.4 8.8  Total Protein 6.1 - 8.1 g/dL - - 6.5  Total Bilirubin 0.2 - 1.2 mg/dL - - 0.4  Alkaline Phos 40 - 115 U/L - - -  AST 10 - 35 U/L - - 22  ALT 9 - 46 U/L - - 18    Renal function CrCl cannot be calculated (Patient's most recent lab result is older than the maximum 21 days allowed.).  Hgb A1c MFr Bld (% of total Hgb)  Date Value  06/22/2020 5.4    LDL Cholesterol (Calc)  Date Value Ref Range Status  10/31/2018 130 (H) mg/dL (calc) Final    Comment:    Reference range: <100 . Desirable range <100 mg/dL for primary prevention;   <70 mg/dL for patients with CHD or diabetic patients  with > or = 2 CHD risk factors. Marland Kitchen LDL-C is now calculated using the Martin-Hopkins  calculation, which is a validated novel method providing  better accuracy than the Friedewald equation in the  estimation of LDL-C.  Cresenciano Genre et al. Annamaria Helling. 0272;536(64): 2061-2068  (http://education.[employer].com/faq/FAQ164)      Vascular Imaging: CLINICAL DATA:  Follow-up left carotid stent there is history of lung cancer  EXAM: CT ANGIOGRAPHY NECK  TECHNIQUE: Multidetector CT imaging of the neck was performed using the standard protocol during bolus administration of intravenous contrast. Multiplanar CT image reconstructions and MIPs were obtained to evaluate the vascular anatomy. Carotid stenosis measurements (when applicable) are obtained utilizing NASCET criteria, using  the distal internal carotid diameter as the denominator.  CONTRAST:  55mL ISOVUE-370 IOPAMIDOL (ISOVUE-370) INJECTION 76%  COMPARISON:  11/25/2020  FINDINGS: Aortic arch: Atheromatous plaque.  Four vessel branching pattern.  Right carotid system: Mild mixed density plaque about the bifurcation. No stenosis, ulceration, or beading.  Left carotid system: Well distended left common carotid artery after stenting. Mainly low-density plaque in the proximal  left ICA causes 40% narrowing, as previously noted. No dissection or ulceration.  Vertebral arteries: No proximal subclavian stenosis on the right. Low-density atheromatous plaque at the proximal left subclavian that is mild. The left vertebral artery arises from the arch. The vertebral arteries are smoothly contoured and widely patent to the dura. The vessels are small in the setting of fetal type PCA flow.  Skeleton: C5-6 non segmentation. Chronic superior endplate compression fractures at T3, T5, and T6.  Other neck: Chronic right maxillary sinusitis with sclerotic wall thickening and atelectasis. Partially calcified debris is present in the right maxillary sinus to a similar degree. No fluid levels.  Upper chest: Radiation changes in the left upper lobe as followed by chest CT.  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 Bilateral fetal type PCA flow.  IMPRESSION: 1. Normalized caliber of the left common carotid artery after stenting. 2. Unchanged 40% atheromatous narrowing of the proximal left ICA.   Electronically Signed   By: Monte Fantasia M.D.   On: 01/11/2021 19:13  Yevonne Aline. Stanford Breed, MD [facility] Phone Number: [phone] 01/12/2021 2:39 PM

## 2021-01-14 ENCOUNTER — Telehealth: Payer: Self-pay | Admitting: Neurology

## 2021-01-14 NOTE — Telephone Encounter (Signed)
Please call patient, EEG was normal, but there was evidence of irregular heart rate  If he has not had previous cardiology evaluation, may consider refer him to see a cardiologist, better to be accomplished by his primary care physician

## 2021-01-14 NOTE — Procedures (Signed)
   HISTORY: 75 year old male, presented with seizure-like activity  TECHNIQUE:  This is a routine 16 channel EEG recording with one channel devoted to a limited EKG recording.  It was performed during wakefulness, drowsiness and asleep.  Hyperventilation and photic stimulation were performed as activating procedures.  There are minimum muscle and movement artifact noted.  Upon maximum arousal, posterior dominant waking rhythm consistent of rhythmic alpha range activity, with frequency of 10 hz. Activities are symmetric over the bilateral posterior derivations and attenuated with eye opening.  Hyperventilation produced mild/moderate buildup with higher amplitude and the slower activities noted.  Photic stimulation did not alter the tracing.  During EEG recording, patient developed drowsiness and no deeper stage of sleep was achieved During EEG recording, there was no epileptiform discharge noted.  EKG demonstrate irregular cardiac rhythm, with heart rate of 64 bpm,  CONCLUSION: This is a  normal awake EEG.  There is no electrodiagnostic evidence of epileptiform discharge.  Marcial Pacas, M.D. Ph.D.  [facility] [place], [place] [address] Phone: [phone] Fax:      [phone]

## 2021-01-18 NOTE — Telephone Encounter (Signed)
I spoke to the patient and provided him with the results below. He informed me his irregular heart rate has been present for the last 36 years. Says it is benign. It is routinely being monitored by his PCP, Dr. Melford Aase.

## 2021-03-02 ENCOUNTER — Ambulatory Visit: Payer: Medicare Other | Admitting: Neurology

## 2021-03-02 ENCOUNTER — Encounter: Payer: Self-pay | Admitting: Neurology

## 2021-03-02 VITALS — BP 138/76 | HR 56 | Ht 69.0 in | Wt 219.5 lb

## 2021-03-02 DIAGNOSIS — R569 Unspecified convulsions: Secondary | ICD-10-CM | POA: Diagnosis not present

## 2021-03-02 DIAGNOSIS — I959 Hypotension, unspecified: Secondary | ICD-10-CM

## 2021-03-02 DIAGNOSIS — Z95828 Presence of other vascular implants and grafts: Secondary | ICD-10-CM | POA: Diagnosis not present

## 2021-03-02 DIAGNOSIS — G939 Disorder of brain, unspecified: Secondary | ICD-10-CM | POA: Diagnosis not present

## 2021-03-02 DIAGNOSIS — Z9889 Other specified postprocedural states: Secondary | ICD-10-CM | POA: Diagnosis not present

## 2021-03-02 MED ORDER — LEVETIRACETAM 500 MG PO TABS: 500.0000 mg | ORAL_TABLET | Freq: Two times a day (BID) | ORAL | 4 refills | Status: DC

## 2021-03-02 NOTE — Progress Notes (Signed)
Chief Complaint  Patient presents with   Follow-up    Room 16 -alone. Seizures - no further episodes, continues generic Keppra 500mg , one tab BID. Recent normal EEG. Carotid stenosis - left carotid stent placed, taking aspirin 81mg , one tab QD.      ASSESSMENT AND PLAN  Christopher Peck is a 75 y.o. 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 Stanford Breed, Marcello Moores 1) US guided right common femoral access 2) Arch aortogram 3) left carotid artery angiogram with second order cannulation (117mL total contrast) 4) left common carotid artery stenting (6x51mm VBX post-dilated to 86mm)    Left parietal lobe lesion  Differentiation diagnosis cortical dysplasia, versus watershed infarction, less likely glioma, lesion has been stable since 2006  Seizure  EEG was normal in April 2022  Up extensive discussion with patient, decided to continue Keppra 500 mg twice daily  Orthostatic hypotension He had a history  of left lung cancer, status post chemo, radiation therapy, evidence of length dependent sensory changes Suggested increase water intake, He has stopped taking Florinef,   DIAGNOSTIC DATA (LABS, IMAGING, TESTING) - I reviewed patient records, labs, notes, testing and imaging myself where available also reviewed findings with  Dr. Felecia Shelling.  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 Melford Aase, Gwyndolyn Saxon, for evaluation of seizure, initial evaluation was on November 30, 2020  I reviewed and summarized the referring note. PMHx. Depression, has been treated with Celexa,  Left lung cancer, status post radiation, chemo, radiation fibrosis.  But did not have surgery Orthostatic hypotension HTN,  Hypothyrodism Smoker in the past, 1ppd until 2006,   On October 02, 2020, when he get up after sitting for a while, when he stand up he had a transient loss of consciousness, was evaluated by primary care physician few days later, was noted to have orthostatic hypotension 126/74 heart rate of 77, standing up blood pressure 83/46 heart rate of 57  On November 25, 2020, he came downstairs, noted lightheadedness, he sat down, then had witnessed seizure episode by his wife, arm stretched out, rigid, grunting sound, last for few minutes,   Was evaluated at emergency room on November 25, 2020, MRI of the brain showed left parietal region watershed lesion, 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 Stanford Breed December 01, 2020.  UPDATE June 28th 2022: He had  left common carotid artery stenting by Dr. Jamelle Haring on December 09, 2020, doing very well since then, no passing out spells, no seizure, he has stopped taking Florinef 0.5 mg daily, feeling great, is questioning  whether he still needs Keppra  PROCEDURE on April 6th 2022 by Dr. 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  PHYSICAL EXAM   Vitals:   03/02/21 1424  BP: 138/76  Pulse: (!) 56  Weight: 219 lb 8 oz (99.6 kg)  Height: 5\' 9"  (1.753 m)   Not recorded     Body mass index is 32.41 kg/m.  PHYSICAL EXAMNIATION:  Gen: NAD, conversant, well nourised, well groomed                     Cardiovascular: Regular rate rhythm, no peripheral edema, warm, nontender. Eyes: Conjunctivae clear without exudates or hemorrhage Neck: Supple, left carotid bruits. Pulmonary: Clear to auscultation bilaterally   NEUROLOGICAL EXAM:  MENTAL STATUS: Short-winded with prolonged talking Speech:    Speech is normal; fluent and spontaneous with normal comprehension.  Cognition:     Orientation to time, place and person     Normal recent and remote memory     Normal Attention span and concentration     Normal Language, naming, repeating,spontaneous speech     Fund of knowledge   CRANIAL NERVES: CN II: Visual fields are full to confrontation. Pupils are round equal and briskly reactive to light. CN III, IV, VI: extraocular movement are normal. No ptosis. CN V: Facial sensation is intact to light touch CN  VII: Face is symmetric with normal eye closure  CN VIII: Hearing is normal to causal conversation. CN IX, X: Phonation is normal. CN XI: Head turning and shoulder shrug are intact  MOTOR: There is no pronator drift of out-stretched arms. Muscle bulk and tone are normal. Muscle strength is normal.  REFLEXES: Reflexes are 2+ and symmetric at the biceps, triceps, 3/3 knees, and absent at ankles. Plantar responses are flexor.  SENSORY: Length dependent decreased light touch, pinprick, vibratory sensation to below knee level, decreased toe proprioception  COORDINATION: There is no trunk or limb dysmetria noted.  GAIT/STANCE: Need push-up to get up from seated position, wide-based, cautious  ALLERGIES: No Known Allergies  HOME MEDICATIONS: Current Outpatient Medications  Medication Sig Dispense Refill   acetaminophen (TYLENOL) 325 MG tablet Take 325 mg by mouth every 6 (six) hours as needed for mild pain (or headaches).     albuterol (ACCUNEB) 1.25 MG/3ML nebulizer solution USE 1 VIAL VIA NEBULIZER EVERY 6 HOURS AS NEEDED FOR WHEEZING (Patient taking differently: Take 1 ampule by nebulization every 6 (six) hours as needed for wheezing or shortness of breath.) 75 mL 20   albuterol (VENTOLIN HFA) 108 (90 Base) MCG/ACT inhaler Use     2 inhalations  15 minutes apart    every 4 hours     to Rescue Asthma (Patient taking differently: Inhale 2 puffs into the lungs See admin instructions. Inhale 2 puffs into the lungs, 15 minutes apart, every four hours- as needed "to rescue from asthma") 48 g 3   aspirin 81 MG EC tablet Take 1 tablet (81 mg total) by mouth daily. 150 tablet 2   citalopram (CELEXA) 20 MG tablet TAKE 1 TABLET BY MOUTH DAILY FOR MOOD (Patient taking differently: Take 20 mg by mouth in the morning.) 90 tablet 1   clopidogrel (PLAVIX) 75 MG tablet Take 1 tablet (75 mg total) by mouth daily. 30 tablet 11   finasteride (PROSCAR) 5 MG tablet TAKE 1 TABLET BY MOUTH EVERY DAY (Patient  taking differently: Take 5 mg by mouth in the morning.) 90 tablet 1   levETIRAcetam (KEPPRA) 500 MG tablet Take 1 tablet (500 mg total) by mouth 2 (two) times daily. 180 tablet 4   levothyroxine (SYNTHROID) 125 MCG tablet Take 1/2-1 TAB DAILY AS DIRECTED ON AN EMPTY STOMACH WITH ONLY WATER FOR 30 MINUTES & NO ANTACID MEDS, CALCIUM OR MAGNESIUM FOR 4 HOURS & AVOID BIOTIN 90 tablet 1   losartan (COZAAR) 100 MG tablet Take 50 mg by mouth in the morning.     NON FORMULARY Take 2 tablets by mouth See admin instructions. VitaFusion for men- Chew 2 gummies by mouth in the afternoon- daily     No current facility-administered medications for this visit.    PAST MEDICAL HISTORY: Past Medical History:  Diagnosis Date   Emphysema of lung ([place])    History of bilateral inguinal hernia repair 05/11/2009   Hypertension     PAST SURGICAL HISTORY: Past Surgical History:  Procedure Laterality Date   AORTIC ARCH ANGIOGRAPHY N/A 12/09/2020   Procedure: AORTIC ARCH ANGIOGRAPHY;  Surgeon: Cherre Robins, MD;  Location: [facility];  Service: Cardiovascular;  Laterality: N/A;   CATARACT EXTRACTION, BILATERAL     08/21, 09/21   HERNIA REPAIR     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   Procedure: PERIPHERAL VASCULAR INTERVENTION;  Surgeon: Cherre Robins, MD;  Location: [facility];  Service: Cardiovascular;  Laterality: Left;  LCCA    FAMILY HISTORY: Family History  Problem Relation Age of Onset   COPD Father    Kidney failure Father     SOCIAL HISTORY: Social History   Socioeconomic History   Marital status: Married    Spouse name: Not on file   Number of children: Not on file   Years of education: Not on file   Highest education level: Not on file  Occupational History   Not on file  Tobacco Use   Smoking status: Former    Pack years: 0.00    Types: Cigarettes    Quit date: 09/05/2004    Years since quitting: 16.4   Smokeless tobacco: Never  Vaping Use   Vaping  Use: Never used  Substance and Sexual Activity   Alcohol use: Not Currently    Alcohol/week: 3.0 standard drinks    Types: 3 Standard drinks or equivalent per week    Comment: none   Drug use: Never   Sexual activity: Not on file  Other Topics Concern   Not on file  Social History Narrative   Right handed   Caffeine use:  1 cup coffee and tea per day   Lives with spouse, Raquel Sarna.   Social Determinants of Health  Financial Resource Strain: Not on file  Food Insecurity: Not on file  Transportation Needs: Not on file  Physical Activity: Not on file  Stress: Not on file  Social Connections: Not on file  Intimate Partner Violence: Not on file      Marcial Pacas, M.D. Ph.D.  [facility] [address], [place], [place] [address] Ph: [phone] Fax: [phone]  CC:  Unk Pinto, [address],  [place] [address]  Unk Pinto, MD  \

## 2021-03-03 DIAGNOSIS — I959 Hypotension, unspecified: Secondary | ICD-10-CM | POA: Insufficient documentation

## 2021-03-03 DIAGNOSIS — G939 Disorder of brain, unspecified: Secondary | ICD-10-CM | POA: Insufficient documentation

## 2021-03-18 ENCOUNTER — Other Ambulatory Visit: Payer: Self-pay | Admitting: Adult Health

## 2021-06-21 ENCOUNTER — Other Ambulatory Visit: Payer: Self-pay | Admitting: Internal Medicine

## 2021-06-21 DIAGNOSIS — J4520 Mild intermittent asthma, uncomplicated: Secondary | ICD-10-CM

## 2021-06-22 DIAGNOSIS — Z9189 Other specified personal risk factors, not elsewhere classified: Secondary | ICD-10-CM

## 2021-06-22 NOTE — Progress Notes (Signed)
[facility])                                            [place] Team                                        Statin Quality Measure Assessment    06/22/2021  RIHAAN BARRACK 1946/02/10 [id number]  Dr. Melford Aase,   I am a [facility] clinical pharmacist that reviews patients for statin quality initiatives.     Per review of chart and payor information, patient has a diagnosis of cardiovascular disease but is not currently filling a statin prescription.  This places patient into the [facility] (Statin Use in Patients with Cardiovascular Disease) measure for CMS.    I could not find any documentation of previous trial of a statin or a history of statin intolerance. Patient has refused statin therapy in the past.      Component Value Date/Time   CHOL 217 (H) 10/31/2018 1218   TRIG 133 10/31/2018 1218   HDL 62 10/31/2018 1218   CHOLHDL 3.5 10/31/2018 1218   VLDL 25 03/23/2017 1444   LDLCALC 130 (H) 10/31/2018 1218    Please consider ONE of the following recommendations:  Initiate high intensity statin Atorvastatin 40mg  once daily, #90, 3 refills   Rosuvastatin 20mg  once daily, #90, 3 refills    Initiate moderate intensity  statin with reduced frequency if prior  statin intolerance 1x weekly, #13, 3 refills   2x weekly, #26, 3 refills   3x weekly, #39, 3 refills    Code for past statin intolerance  (required annually)  Provider Requirements: Must associate code during an office visit or telehealth encounter   Drug Induced Myopathy G72.0   Myositis, unspecified M60.9   Myopathy, unspecified G72.9   Rhabdomyolysis  O70.96   Alcoholic cirrhosis of liver without ascites G83.66   Alcoholic cirrhosis of liver with ascites K70.31   Unspecified cirrhosis of liver K74.60   Toxic liver disease with fibrosis and cirrhosis of liver K71.7      Please let us know your decision.    Thank you!  Reed Breech, PharmD Clinical  Pharmacist  [place] [phone]

## 2021-06-24 ENCOUNTER — Encounter: Payer: Self-pay | Admitting: Internal Medicine

## 2021-06-24 ENCOUNTER — Other Ambulatory Visit: Payer: Self-pay

## 2021-06-24 ENCOUNTER — Ambulatory Visit (INDEPENDENT_AMBULATORY_CARE_PROVIDER_SITE_OTHER): Payer: Medicare Other | Admitting: Internal Medicine

## 2021-06-24 VITALS — BP 136/86 | HR 59 | Temp 97.5°F | Resp 16 | Ht 69.0 in | Wt 225.6 lb

## 2021-06-24 DIAGNOSIS — I2510 Atherosclerotic heart disease of native coronary artery without angina pectoris: Secondary | ICD-10-CM

## 2021-06-24 DIAGNOSIS — Z79899 Other long term (current) drug therapy: Secondary | ICD-10-CM

## 2021-06-24 DIAGNOSIS — E559 Vitamin D deficiency, unspecified: Secondary | ICD-10-CM | POA: Diagnosis not present

## 2021-06-24 DIAGNOSIS — Z1211 Encounter for screening for malignant neoplasm of colon: Secondary | ICD-10-CM

## 2021-06-24 DIAGNOSIS — Z136 Encounter for screening for cardiovascular disorders: Secondary | ICD-10-CM | POA: Diagnosis not present

## 2021-06-24 DIAGNOSIS — Z0000 Encounter for general adult medical examination without abnormal findings: Secondary | ICD-10-CM | POA: Diagnosis not present

## 2021-06-24 DIAGNOSIS — Z87891 Personal history of nicotine dependence: Secondary | ICD-10-CM

## 2021-06-24 DIAGNOSIS — R7309 Other abnormal glucose: Secondary | ICD-10-CM | POA: Diagnosis not present

## 2021-06-24 DIAGNOSIS — R0989 Other specified symptoms and signs involving the circulatory and respiratory systems: Secondary | ICD-10-CM

## 2021-06-24 DIAGNOSIS — E782 Mixed hyperlipidemia: Secondary | ICD-10-CM

## 2021-06-24 DIAGNOSIS — Z1212 Encounter for screening for malignant neoplasm of rectum: Secondary | ICD-10-CM

## 2021-06-24 DIAGNOSIS — R569 Unspecified convulsions: Secondary | ICD-10-CM

## 2021-06-24 DIAGNOSIS — E039 Hypothyroidism, unspecified: Secondary | ICD-10-CM

## 2021-06-24 DIAGNOSIS — Z789 Other specified health status: Secondary | ICD-10-CM

## 2021-06-24 DIAGNOSIS — Z0001 Encounter for general adult medical examination with abnormal findings: Secondary | ICD-10-CM

## 2021-06-24 DIAGNOSIS — J701 Chronic and other pulmonary manifestations due to radiation: Secondary | ICD-10-CM

## 2021-06-24 DIAGNOSIS — Z125 Encounter for screening for malignant neoplasm of prostate: Secondary | ICD-10-CM

## 2021-06-24 DIAGNOSIS — N320 Bladder-neck obstruction: Secondary | ICD-10-CM

## 2021-06-24 NOTE — Patient Instructions (Signed)

## 2021-06-24 NOTE — Progress Notes (Signed)
Annual  Screening/Preventative Visit  & Comprehensive Evaluation & Examination  Future Appointments  Date Time Provider [place]  06/24/2021  2:00 PM Unk Pinto, MD [facility] None  06/27/2022  2:00 PM Unk Pinto, MD [facility] None            This very nice 75 y.o. MWM presents for a Screening /Preventative Visit & comprehensive evaluation and management of multiple medical co-morbidities.  Patient has been followed for HTN, HLD, Prediabetes and Vitamin D Deficiency.           In March 2022, patient had a seizure  & was started on Keppra. Patient was also found to have an occluded Lt Common carotid Aa which he had stented in April by Dr Jamelle Haring. Patient also is known to have a Left Parietal lobe lesion unchanged since 2006 & followed by Dr Krista Blue.        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 Previous CXR's have shown COPD. He has refused any CXR or chest CT's since 2017.        Patient has hx/o labile HTN predates since 2012. Patient's BP has been controlled at home.  Today's BP was initially elevated & rechecked at goal -  136/86.  Patient also has hx/o parox. Afib (CHADS2VASC = 2) since age 84 yo& has refused any meds Patient denies any cardiac symptoms as chest pain, palpitations, shortness of breath, dizziness or ankle swelling.       Patient's hyperlipidemia is not controlled with diet and patient has been reticient to take meds for Cholesterol.  Last lipids were not at goal :  Lab Results  Component Value Date   CHOL 217 (H) 10/31/2018   HDL 62 10/31/2018   LDLCALC 130 (H) 10/31/2018   TRIG 133 10/31/2018   CHOLHDL 3.5 10/31/2018         Patient was dx'd Hypothyroid in 2013 and does take replacement meds.        Patient has been monitored expectantly for glucose intolerance and patient denies reactive hypoglycemic symptoms, visual blurring, diabetic polys or  paresthesias. Last A1c was normal & at goal :   Lab Results  Component Value Date   HGBA1C 5.4 06/22/2020          Finally, patient has history of Vitamin D Deficiency  ("34" /2008 &"23" /2016) and last vitamin D was still very low (goal 70-100) and patient refuses to take recommended Vit d supplements:   Lab Results  Component Value Date   VD25OH 34 04/19/2018     Current Outpatient Medications on File Prior to Visit  Medication Sig   acetaminophen (TYLENOL) 325 MG tablet Take 325 mg by mouth every 6 (six) hours as needed for mild pain (or headaches).   albuterol (ACCUNEB) 1.25 MG/3ML nebulizer solution USE 1 VIAL VIA NEBULIZER EVERY 6 HOURS AS NEEDED FOR WHEEZING (Patient taking differently: Take 1 ampule by nebulization every 6 (six) hours as needed for wheezing or shortness of breath.)   albuterol (VENTOLIN HFA) 108 (90 Base) MCG/ACT inhaler USE 2 INHALATIONS 15 MINUTES APART EVERY 4 HOURS TO RESCUE ASTHMA   aspirin 81 MG EC tablet Take 1 tablet (81 mg total) by mouth daily.   citalopram (CELEXA) 20 MG tablet TAKE 1 TABLET BY MOUTH DAILY FOR MOOD   clopidogrel (PLAVIX) 75 MG tablet Take 1 tablet (75 mg total) by mouth daily.   levETIRAcetam (KEPPRA) 500 MG tablet  Take 1 tablet (500 mg total) by mouth 2 (two) times daily.   levothyroxine (SYNTHROID) 125 MCG tablet Take 1/2-1 TAB DAILY AS DIRECTED ON AN EMPTY STOMACH WITH ONLY WATER FOR 30 MINUTES & NO ANTACID MEDS, CALCIUM OR MAGNESIUM FOR 4 HOURS & AVOID BIOTIN   losartan (COZAAR) 100 MG tablet Take 50 mg by mouth in the morning.   NON FORMULARY Take 2 tablets by mouth See admin instructions. VitaFusion for men- Chew 2 gummies by mouth in the afternoon- daily    No Known Allergies   Past Medical History:  Diagnosis Date   Emphysema of lung ([place])    History of bilateral inguinal hernia repair 05/11/2009   Hypertension      Health Maintenance  Topic Date Due   COVID-19 Vaccine (1) Never done   Zoster Vaccines- Shingrix (1  of 2) Never done   INFLUENZA VACCINE  04/05/2021   TETANUS/TDAP  01/02/2022   Pneumonia Vaccine 5+ Years old  Completed   HPV VACCINES  Aged Out    Immunization History  Administered Date(s) Administered   Influenza, High Dose Seasonal PF 07/21/2014, 06/19/2018, 05/23/2019, 06/22/2020   Influenza-Unspecified 06/24/2015, 06/07/2017   Pneumococcal Conjugate-13 06/24/2015   Pneumococcal Polysaccharide-23 01/03/2012, 08/23/2017   Td 01/03/2012    Last Colon - always refuses   Past Surgical History:  Procedure Laterality Date   AORTIC ARCH ANGIOGRAPHY N/A 12/09/2020   Procedure: AORTIC ARCH ANGIOGRAPHY;  Surgeon: Cherre Robins, MD;  Location: [facility];  Service: Cardiovascular;  Laterality: N/A;   CATARACT EXTRACTION, BILATERAL     08/21, 09/21   HERNIA REPAIR     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   Procedure: PERIPHERAL VASCULAR INTERVENTION;  Surgeon: Cherre Robins, MD;  Location: [facility];  Service: Cardiovascular;  Laterality: Left;  LCCA     Family History  Problem Relation Age of Onset   COPD Father    Kidney failure Father      Social History   Tobacco Use   Smoking status: Former    Types: Cigarettes    Quit date: 09/05/2004    Years since quitting: 16.8   Smokeless tobacco: Never  Vaping Use   Vaping Use: Never used  Substance Use Topics   Alcohol use: Not Currently    Alcohol/week: 3.0 standard drinks    Types: 3 Standard drinks or equivalent per week    Comment: none   Drug use: Never      ROS Constitutional: Denies fever, chills, weight loss/gain, headaches, insomnia,  night sweats or change in appetite. Does c/o fatigue. Eyes: Denies redness, blurred vision, diplopia, discharge, itchy or watery eyes.  ENT: Denies discharge, congestion, post nasal drip, epistaxis, sore throat, earache, hearing loss, dental pain, Tinnitus, Vertigo, Sinus pain or snoring.  Cardio: Denies chest pain, palpitations, irregular  heartbeat, syncope, dyspnea, diaphoresis, orthopnea, PND, claudication or edema Respiratory: denies cough, dyspnea, DOE, pleurisy, hoarseness, laryngitis or wheezing.  Gastrointestinal: Denies dysphagia, heartburn, reflux, water brash, pain, cramps, nausea, vomiting, bloating, diarrhea, constipation, hematemesis, melena, hematochezia, jaundice or hemorrhoids Genitourinary: Denies dysuria, frequency, urgency, nocturia, hesitancy, discharge, hematuria or flank pain Musculoskeletal: Denies arthralgia, myalgia, stiffness, Jt. Swelling, pain, limp or strain/sprain. Denies Falls. Skin: Denies puritis, rash, hives, warts, acne, eczema or change in skin lesion Neuro: No weakness, tremor, incoordination, spasms, paresthesia or pain Psychiatric: Denies confusion, memory loss or sensory loss. Denies Depression. Endocrine: Denies change in weight, skin, hair change, nocturia, and paresthesia, diabetic polys, visual blurring or  hyper / hypo glycemic episodes.  Heme/Lymph: No excessive bleeding, bruising or enlarged lymph nodes.   Physical Exam  BP 136/86   Pulse (!) 59   Temp (!) 97.5 F (36.4 C)   Resp 16   Ht 5\' 9"  (1.753 m)   Wt 225 lb 9.6 oz (102.3 kg)   SpO2 97%   BMI 33.32 kg/m   General Appearance: Well nourished and well groomed and in no apparent distress.  Eyes: PERRLA, EOMs, conjunctiva no swelling or erythema, normal fundi and vessels. Sinuses: No frontal/maxillary tenderness ENT/Mouth: EACs patent / TMs  nl. Nares clear without erythema, swelling, mucoid exudates. Oral hygiene is good. No erythema, swelling, or exudate. Tongue normal, non-obstructing. Tonsils not swollen or erythematous. Hearing normal.  Neck: Supple, thyroid not palpable. No bruits, nodes or JVD. Respiratory: Respiratory effort normal.  BS equal and clear bilateral without rales, rhonci, wheezing or stridor. Cardio: Heart sounds are normal with regular rate and rhythm and no murmurs, rubs or gallops. Peripheral  pulses are normal and equal bilaterally without edema. No aortic or femoral bruits. Chest: symmetric with normal excursions and percussion.  Abdomen: Soft, with Nl bowel sounds. Nontender, no guarding, rebound, hernias, masses, or organomegaly.  Lymphatics: Non tender without lymphadenopathy.  Musculoskeletal: Full ROM all peripheral extremities, joint stability, 5/5 strength, and normal gait. Skin: Warm and dry without rashes, lesions, cyanosis, clubbing or  ecchymosis.  Neuro: Cranial nerves intact, reflexes equal bilaterally. Normal muscle tone, no cerebellar symptoms. Sensation intact.  Pysch: Alert and oriented X 3 with normal affect, insight and judgment appropriate.   Assessment and Plan  1. Annual Preventative/Screening Exam    2. Labile hypertension  - EKG 12-Lead - [place], RETROPERITNL ABD,  LTD - Urinalysis, Routine w reflex microscopic - Microalbumin / creatinine urine ratio - COMPLETE METABOLIC PANEL WITH GFR - Magnesium - TSH  3. Hyperlipidemia, mixed  - EKG 12-Lead - [place], RETROPERITNL ABD,  LTD - Lipid panel - TSH  4. Abnormal glucose  - EKG 12-Lead - [place], RETROPERITNL ABD,  LTD - Hemoglobin A1c - Insulin, random  5. Vitamin D deficiency  - VITAMIN D 25 Hydroxy   6. Hypothyroidism  - Hemoglobin A1c  7. 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 Prostate cancer screening  - PSA  10. Screening for colorectal cancer  - POC Hemoccult Bld/Stl   11. Screening for ischemic heart disease  - EKG 12-Lead  12. Former smoker  - EKG 12-Lead - [place], RETROPERITNL ABD,  LTD  13. Screening for AAA (aortic abdominal aneurysm)  - [place], RETROPERITNL ABD,  LTD  14. Seizures (HCC)  - Levetiracetam, Immunoassay  15. Medication management  - CBC with Differential/Platelet - COMPLETE METABOLIC PANEL WITH GFR - Magnesium - Lipid panel - TSH - Hemoglobin A1c - Insulin, random - VITAMIN D 25 Hydroxy  - Levetiracetam,  Immunoassay  16. Atherosclerosis of native coronary artery          Patient was counseled in prudent diet, weight control to achieve/maintain BMI less than 25, BP monitoring, regular exercise and medications as discussed.  Discussed med effects and SE's. Routine screening labs and tests as requested with regular follow-up as recommended. Over 40 minutes of exam, counseling, chart review and high complex critical decision making was performed   Kirtland Bouchard, MD

## 2021-06-25 LAB — COMPLETE METABOLIC PANEL WITH GFR
AG Ratio: 1.5 (calc) (ref 1.0–2.5)
ALT: 16 U/L (ref 9–46)
AST: 26 U/L (ref 10–35)
Albumin: 4 g/dL (ref 3.6–5.1)
Alkaline phosphatase (APISO): 74 U/L (ref 35–144)
BUN/Creatinine Ratio: 12 (calc) (ref 6–22)
BUN: 16 mg/dL (ref 7–25)
CO2: 23 mmol/L (ref 20–32)
Calcium: 9.2 mg/dL (ref 8.6–10.3)
Chloride: 105 mmol/L (ref 98–110)
Creat: 1.32 mg/dL — ABNORMAL HIGH (ref 0.70–1.28)
Globulin: 2.6 g/dL (calc) (ref 1.9–3.7)
Glucose, Bld: 81 mg/dL (ref 65–99)
Potassium: 4.2 mmol/L (ref 3.5–5.3)
Sodium: 141 mmol/L (ref 135–146)
Total Bilirubin: 0.4 mg/dL (ref 0.2–1.2)
Total Protein: 6.6 g/dL (ref 6.1–8.1)
eGFR: 57 mL/min/{1.73_m2} — ABNORMAL LOW (ref >=60)

## 2021-06-25 LAB — VITAMIN D 25 HYDROXY (VIT D DEFICIENCY, FRACTURES): Vit D, 25-Hydroxy: 36 ng/mL (ref 30–100)

## 2021-06-25 LAB — HEMOGLOBIN A1C
Hgb A1c MFr Bld: 5.2 % of total Hgb (ref <5.7)
Mean Plasma Glucose: 103 mg/dL
eAG (mmol/L): 5.7 mmol/L

## 2021-06-25 LAB — INSULIN, RANDOM: Insulin: 4.6 u[IU]/mL

## 2021-06-25 LAB — CBC WITH DIFFERENTIAL/PLATELET
Absolute Monocytes: 1005 cells/uL — ABNORMAL HIGH (ref 200–950)
Basophils Absolute: 90 cells/uL (ref 0–200)
Basophils Relative: 1.2 %
Eosinophils Absolute: 180 cells/uL (ref 15–500)
Eosinophils Relative: 2.4 %
HCT: 42.5 % (ref 38.5–50.0)
Hemoglobin: 14.1 g/dL (ref 13.2–17.1)
Lymphs Abs: 1875 cells/uL (ref 850–3900)
MCH: 32.7 pg (ref 27.0–33.0)
MCHC: 33.2 g/dL (ref 32.0–36.0)
MCV: 98.6 fL (ref 80.0–100.0)
MPV: 9.7 fL (ref 7.5–12.5)
Monocytes Relative: 13.4 %
Neutro Abs: 4350 cells/uL (ref 1500–7800)
Neutrophils Relative %: 58 %
Platelets: 214 10*3/uL (ref 140–400)
RBC: 4.31 10*6/uL (ref 4.20–5.80)
RDW: 12.3 % (ref 11.0–15.0)
Total Lymphocyte: 25 %
WBC: 7.5 10*3/uL (ref 3.8–10.8)

## 2021-06-25 LAB — URINALYSIS, ROUTINE W REFLEX MICROSCOPIC
Bilirubin Urine: NEGATIVE
Glucose, UA: NEGATIVE
Hgb urine dipstick: NEGATIVE
Ketones, ur: NEGATIVE
Leukocytes,Ua: NEGATIVE
Nitrite: NEGATIVE
Protein, ur: NEGATIVE
Specific Gravity, Urine: 1.016 (ref 1.001–1.035)
pH: 6 (ref 5.0–8.0)

## 2021-06-25 LAB — LIPID PANEL
Cholesterol: 214 mg/dL — ABNORMAL HIGH (ref <200)
HDL: 60 mg/dL (ref >=40)
LDL Cholesterol (Calc): 126 mg/dL (calc) — ABNORMAL HIGH
Non-HDL Cholesterol (Calc): 154 mg/dL (calc) — ABNORMAL HIGH (ref <130)
Total CHOL/HDL Ratio: 3.6 (calc) (ref <5.0)
Triglycerides: 163 mg/dL — ABNORMAL HIGH (ref <150)

## 2021-06-25 LAB — PSA: PSA: 0.17 ng/mL (ref <=4.00)

## 2021-06-25 LAB — MAGNESIUM: Magnesium: 2.1 mg/dL (ref 1.5–2.5)

## 2021-06-25 LAB — TSH: TSH: 2.66 mIU/L (ref 0.40–4.50)

## 2021-06-25 LAB — MICROALBUMIN / CREATININE URINE RATIO
Creatinine, Urine: 116 mg/dL (ref 20–320)
Microalb Creat Ratio: 46 mcg/mg creat — ABNORMAL HIGH (ref <30)
Microalb, Ur: 5.3 mg/dL

## 2021-06-26 NOTE — Progress Notes (Signed)
============================================================ - Test results slightly outside the reference range are not unusual. If there is anything important, I will review this with you,  otherwise it is considered normal test values.  If you have further questions,  please do not hesitate to contact me at the office or via My Chart.  ============================================================ ============================================================  -  PSA  -  very Low   - Great ! ============================================================ ============================================================  - Total Chol =  214   is very high risk for Heart Attack /Stroke /Vascular Dementia          (  Ideal or Goal is less than 180  !  )  - and  - Bad /Dangerous LDL Chol = 126  - - >> Sitting on a time Bomb !           (  Ideal or Goal is less than 70  !  )    - Treating with meds to lower Cholesterol is treating the result                                                                                   & NOT  treating the cause  - The cause is Bad Diet !  - Read or listen to      Dr Wyman Songster 's book       " How Not to Die ! "    - Recommend  a stricter plant based low cholesterol diet   - Cholesterol only comes from animal sources  - ie. meat, dairy, egg yolks  - Eat all the vegetables you want.  - Avoid meat, especially red meat - Beef AND Pork .  - Avoid cheese & dairy - milk & ice cream.     - Cheese is the most concentrated form of trans-fats which  is the worst thing to clog up our arteries.   - Veggie cheese is OK which can be found in the fresh  produce section at [employer] or [employer] or [employer] ============================================================ ============================================================   - As a last resort, you can take medicines if you refuse to improve your diet    ============================================================ ============================================================   - Thyroid is Normal          ============================================================ ============================================================   - A1c is normal - No Diabetes  - Great  ! ============================================================ ============================================================   - Vitamin D = 36 - is extremely low   - Vitamin D goal is between 70-100.   - Please Take  Vitamin D 2 capsules of 5,000 units /daily = 10,000 units /daily   - It is very important as a natural anti-inflammatory and helping the  immune system protect against viral infections, like the Covid-19    helping hair, skin, and nails, as well as reducing stroke and  heart attack risk.   - It helps your bones and helps with mood.  - It also decreases numerous cancer risks so please  take it as directed.   - Low Vit D is associated with a 200-300% higher risk for  CANCER   and 200-300% higher risk for HEART   ATTACK  &  STROKE.    -  It is also associated with higher death rate at younger ages,   autoimmune diseases like Rheumatoid arthritis, Lupus,  Multiple Sclerosis.     - Also many other serious conditions, like depression, Alzheimer's  Dementia, infertility, muscle aches, fatigue, fibromyalgia   - just to name a few. ============================================================ ============================================================   - All Else - CBC - Kidneys - U/A - Electrolytes - Liver - Magnesium & Thyroid    - all  Normal / OK  ============================================================ ============================================================

## 2021-07-04 ENCOUNTER — Encounter: Payer: Self-pay | Admitting: Internal Medicine

## 2021-07-13 ENCOUNTER — Telehealth: Payer: Self-pay | Admitting: Internal Medicine

## 2021-07-13 NOTE — Chronic Care Management (AMB) (Signed)
  Chronic Care Management   Outreach Note  07/13/2021 Name: SI JACHIM MRN: [id number] DOB: 1946/01/21  Referred by: Unk Pinto, MD Reason for referral : No chief complaint on file.   An unsuccessful telephone outreach was attempted today. The patient was referred to the pharmacist for assistance with care management and care coordination.   Follow Up Plan: PT WANTS TO DISCUSS WITH PCP  Tatjana Dellinger [employer] Scheduler

## 2021-07-26 ENCOUNTER — Ambulatory Visit: Payer: Medicare Other | Admitting: Internal Medicine

## 2021-08-22 ENCOUNTER — Other Ambulatory Visit: Payer: Self-pay | Admitting: Adult Health Nurse Practitioner

## 2021-08-22 DIAGNOSIS — J4520 Mild intermittent asthma, uncomplicated: Secondary | ICD-10-CM

## 2021-09-17 ENCOUNTER — Other Ambulatory Visit: Payer: Self-pay | Admitting: Nurse Practitioner

## 2021-09-28 ENCOUNTER — Other Ambulatory Visit: Payer: Self-pay | Admitting: Adult Health

## 2021-12-17 ENCOUNTER — Encounter (HOSPITAL_COMMUNITY): Payer: Self-pay

## 2021-12-22 ENCOUNTER — Other Ambulatory Visit: Payer: Self-pay | Admitting: Vascular Surgery

## 2022-01-07 ENCOUNTER — Other Ambulatory Visit: Payer: Self-pay

## 2022-01-07 DIAGNOSIS — I6522 Occlusion and stenosis of left carotid artery: Secondary | ICD-10-CM

## 2022-01-18 NOTE — Progress Notes (Signed)
MEDICARE ANNUAL WELLNESS VISIT AND FOLLOW UP ?Assessment:  ? ? ?Encounter for Annual Medicare Wellness Visit ?- declines vaccines, colonoscopy/cologuard, CXR, CT ? ?Atherosclerosis of native coronary artery of native heart without angina pectoris ?-DASH diet ?-BP control ?-recommended statin for LDL goal reduction which patient declines ? 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 Stanford Breed ? ?Labile hypertension ?-well controlled currently ?-dash diet ?-monitor at home ?-exercise as tolerated ?- TSH ? 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 ?Medication management ?- CBC with Differential/Platelet ?- CMP WITH GFR ? 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 ?Personal history of lung cancer ?-resolved s/p radiation ?-followed by oncology ? ?Hypothyroidism, unspecified type ?-TSH ?-cont levothyroxine ?-dose adjust if necessary  ? ?BPH/Prostatism ?-cont finasteride ?-pateint declined further medication ? ?Hyperlipidemia, diet controlled ?-recommended statin therapy ?-patient refused ?- lipid panel ? ?Other abnormal glucose ?-cont diet and exercise ?- A1c ? ?Vitamin D deficiency ?-cont Vit D supplement ? ?Over 30 minutes of exam, counseling, chart review, and critical decision making was performed ? ?Future Appointments  ?Date Time Provider [place]  ?02/01/2022  9:20 AM Cherre Robins, MD [facility]  ?06/27/2022  2:00 PM Unk Pinto, MD [facility] None  ?12/14/2022 11:00 AM Magda Bernheim, NP [facility] None  ? ? ? ?Plan:  ? ?During the course of the visit the patient was educated and counseled about appropriate screening and preventive services including:  ? ?Pneumococcal vaccine  ?Influenza vaccine ?Prevnar 13 ?Td vaccine ?Screening electrocardiogram ?Colorectal cancer screening ?Diabetes screening ?Glaucoma screening ?Nutrition counseling   ? ? ?Subjective:  ?Christopher Peck is a 76 y.o. male who presents for Medicare Annual Wellness Visit and 3 month follow up for HTN, hyperlipidemia, glucose management, and vitamin D Def.  ? ?Continues to have hip pain and insomnia- currently not using medication.  ? ?He does bruise easily from use of Plavix. Follows with Dr. Stanford Breed for carotid artery stenosis. He had Left common carotid artery stenting 12/09/20. Last visit was 01/12/21, has follow up appointment 02/01/22 ? 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 He refuses to have any further CXR's or CT scans. Brother has pulmonary fibrosis from cotton mills, wife had shingles.  ? ?BMI is Body mass index is 32.87 kg/m?., he has not been working on diet and exercise, he is not interested in working on this.  ?Wt Readings from Last 3 Encounters:  ?01/19/22 222 lb 9.6 oz (101 kg)  ?06/24/21 225 lb 9.6 oz (102.3 kg)  ?03/02/21 219 lb 8 oz (99.6 kg)  ? ?His blood pressure has been controlled at home, today their BP is BP: 140/82  ?BP Readings from Last 3 Encounters:  ?01/19/22 140/82  ?06/24/21 136/86  ?03/02/21 138/76  ? ?He does not workout. He denies chest pain, shortness of breath, dizziness. He reports that he has been doing yard work and has been trying to walk around as much as he can.   ?He does have some palpitations that occur with anxiety and use of Albuterol inhaler.  ? ?He is not on cholesterol medication and denies myalgias. His cholesterol is not at goal. The cholesterol last visit was:   ?Lab Results  ?Component Value Date  ? CHOL 214 (H) 06/24/2021  ? HDL 60 06/24/2021  ? [place] 126 (H) 06/24/2021  ? TRIG 163 (H) 06/24/2021  ?  CHOLHDL 3.6 06/24/2021  ? ? He has been working on diet and exercise for prediabetes, and denies increased appetite, nausea, paresthesia of the feet, polydipsia, polyuria, visual disturbances and vomiting. Last A1C in the office was:  ?Lab Results  ?Component  Value Date  ? HGBA1C 5.2 06/24/2021  ? ?He is on thyroid medication. His medication was not changed last visit.   ?Lab Results  ?Component Value Date  ? TSH 2.66 06/24/2021  ?Marland Kitchen  ?Last GFR ?Lab Results  ?Component Value Date  ? GFRNONAA >60 11/25/2020  ? ?Patient is not on Vitamin D supplement, declines to supplement, takes a multivitamin.  ?Lab Results  ?Component Value Date  ? VD25OH 36 06/24/2021  ?   ? ? ?Medication Review: ?Current Outpatient Medications on File Prior to Visit  ?Medication Sig Dispense Refill  ? albuterol (ACCUNEB) 1.25 MG/3ML nebulizer solution USE 1 VIAL VIA NEBULIZER EVERY 6 HOURS AS NEEDED FOR WHEEZING 75 mL 20  ? albuterol (VENTOLIN HFA) 108 (90 Base) MCG/ACT inhaler USE 2 INHALATIONS 15 MINUTES APART EVERY 4 HOURS TO RESCUE ASTHMA 25.5 each 3  ? citalopram (CELEXA) 20 MG tablet TAKE 1 TABLET BY MOUTH DAILY FOR MOOD 90 tablet 1  ? clopidogrel (PLAVIX) 75 MG tablet TAKE 1 TABLET BY MOUTH EVERY DAY 90 tablet 3  ? levothyroxine (SYNTHROID) 125 MCG tablet TAKE 1/2-1 TAB DAILY AS DIRECTED ON AN EMPTY STOMACH WITH ONLY WATER FOR 30 MINUTES & NO ANTACID MEDS, CALCIUM OR MAGNESIUM FOR 4 HOURS & AVOID BIOTIN 90 tablet 1  ? ?No current facility-administered medications on file prior to visit.  ? ? ?Allergies: ?No Known Allergies ? 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 ? ?Screening Tests ?Immunization History  ?Administered Date(s) Administered  ? Influenza, High Dose Seasonal PF 07/21/2014,  06/19/2018, 05/23/2019, 06/22/2020  ? Influenza-Unspecified 06/24/2015, 06/07/2017  ? Pneumococcal Conjugate-13 06/24/2015  ? Pneumococcal Polysaccharide-23 01/03/2012, 08/23/2017  ? Td 01/03/2012  ? ? ?Preventative care: ?Last colonoscopy: never has had, refuses colonoscopy or cologuard, does hemoccult annually at CPE ? ?Tetanus: 2013 ?Influenza: 2019 ?Pneumonia 2018 ?Prevnar 13: 2016 ?Shingles: declines ? ?Names of Other Physician/Practitioners you currently use: ?1. [facility] here for primary care ?2. Dr. Satira Sark, eye doctor, has appt scheduled 03/2022 ?3. Does not see, dentist, last visit 2018, full dentures  ? ?Patient Care Team: ?Unk Pinto, MD as PCP - General (Internal Medicine) ?Curt Bears, MD as Consulting Physician (Oncology) ?Rutherford Guys, MD as Consulting Physician (Ophthalmology) ? ?Surgical: ?He  has a past surgical history that includes Hernia repair; Cataract extraction, bilateral; AORTIC ARCH ANGIOGRAPHY (N/A, 12/09/2020); and PERIPHERAL VASCULAR INTERVENTION (Left, 12/09/2020). ?Family ?His family history includes COPD in his father; Kidney failure in his father. ?Social history  ?He reports that he quit smoking about 17 years ago. His smoking use included cigarettes. He has never used smokeless tobacco. He reports that he does not currently use alcohol after a past usage of about 3.0 standard drinks per week. He reports that he does not use drugs. ? ?MEDICARE WELLNESS OBJECTIVES: ?Physical activity: Current Exercise Habits: Home exercise routine, Type of exercise: Other - see comments (  yard work), Time (Minutes): 20, Frequency (Times/Week): 3, Weekly Exercise (Minutes/Week): 60, Intensity: Mild, Exercise limited by: None identified ?Cardiac risk factors: Cardiac Risk Factors include: advanced age (>42men, >30 women);dyslipidemia;hypertension;male gender;sedentary lifestyle;obesity (BMI >30kg/m2) ?Depression/mood screen:   ? ?  01/19/2022  ? 10:55 AM   ?Depression screen PHQ 2/9  ?Decreased Interest 0  ?Down, Depressed, Hopeless 0  ?PHQ - 2 Score 0  ?  ?ADLs:  ? ?  01/19/2022  ? 10:56 AM 06/24/2021  ? 12:09 AM  ?In your present state of health, do you have any dif

## 2022-01-19 ENCOUNTER — Ambulatory Visit (INDEPENDENT_AMBULATORY_CARE_PROVIDER_SITE_OTHER): Payer: Medicare Other | Admitting: Nurse Practitioner

## 2022-01-19 ENCOUNTER — Encounter: Payer: Self-pay | Admitting: Nurse Practitioner

## 2022-01-19 VITALS — BP 140/82 | HR 72 | Temp 97.7°F | Wt 222.6 lb

## 2022-01-19 DIAGNOSIS — Z85118 Personal history of other malignant neoplasm of bronchus and lung: Secondary | ICD-10-CM

## 2022-01-19 DIAGNOSIS — E559 Vitamin D deficiency, unspecified: Secondary | ICD-10-CM | POA: Diagnosis not present

## 2022-01-19 DIAGNOSIS — F3341 Major depressive disorder, recurrent, in partial remission: Secondary | ICD-10-CM | POA: Diagnosis not present

## 2022-01-19 DIAGNOSIS — E039 Hypothyroidism, unspecified: Secondary | ICD-10-CM | POA: Diagnosis not present

## 2022-01-19 DIAGNOSIS — Z0001 Encounter for general adult medical examination with abnormal findings: Secondary | ICD-10-CM | POA: Diagnosis not present

## 2022-01-19 DIAGNOSIS — R6889 Other general symptoms and signs: Secondary | ICD-10-CM

## 2022-01-19 DIAGNOSIS — N320 Bladder-neck obstruction: Secondary | ICD-10-CM

## 2022-01-19 DIAGNOSIS — R7309 Other abnormal glucose: Secondary | ICD-10-CM | POA: Diagnosis not present

## 2022-01-19 DIAGNOSIS — Z0000 Encounter for general adult medical examination without abnormal findings: Secondary | ICD-10-CM

## 2022-01-19 DIAGNOSIS — I2510 Atherosclerotic heart disease of native coronary artery without angina pectoris: Secondary | ICD-10-CM

## 2022-01-19 DIAGNOSIS — E663 Overweight: Secondary | ICD-10-CM

## 2022-01-19 DIAGNOSIS — R0989 Other specified symptoms and signs involving the circulatory and respiratory systems: Secondary | ICD-10-CM

## 2022-01-19 DIAGNOSIS — I63032 Cerebral infarction due to thrombosis of left carotid artery: Secondary | ICD-10-CM

## 2022-01-19 DIAGNOSIS — E782 Mixed hyperlipidemia: Secondary | ICD-10-CM

## 2022-01-19 DIAGNOSIS — J701 Chronic and other pulmonary manifestations due to radiation: Secondary | ICD-10-CM

## 2022-01-20 LAB — HEMOGLOBIN A1C
Hgb A1c MFr Bld: 5.5 % of total Hgb (ref <5.7)
Mean Plasma Glucose: 111 mg/dL
eAG (mmol/L): 6.2 mmol/L

## 2022-01-20 LAB — CBC WITH DIFFERENTIAL/PLATELET
Absolute Monocytes: 876 {cells}/uL (ref 200–950)
Basophils Absolute: 80 {cells}/uL (ref 0–200)
Basophils Relative: 1.1 %
Eosinophils Absolute: 183 {cells}/uL (ref 15–500)
Eosinophils Relative: 2.5 %
HCT: 48.2 % (ref 38.5–50.0)
Hemoglobin: 16.3 g/dL (ref 13.2–17.1)
Lymphs Abs: 1453 {cells}/uL (ref 850–3900)
MCH: 32.3 pg (ref 27.0–33.0)
MCHC: 33.8 g/dL (ref 32.0–36.0)
MCV: 95.6 fL (ref 80.0–100.0)
MPV: 9.4 fL (ref 7.5–12.5)
Monocytes Relative: 12 %
Neutro Abs: 4709 {cells}/uL (ref 1500–7800)
Neutrophils Relative %: 64.5 %
Platelets: 236 Thousand/uL (ref 140–400)
RBC: 5.04 Million/uL (ref 4.20–5.80)
RDW: 12.1 % (ref 11.0–15.0)
Total Lymphocyte: 19.9 %
WBC: 7.3 Thousand/uL (ref 3.8–10.8)

## 2022-01-20 LAB — COMPLETE METABOLIC PANEL WITH GFR
AG Ratio: 1.5 (calc) (ref 1.0–2.5)
ALT: 14 U/L (ref 9–46)
AST: 23 U/L (ref 10–35)
Albumin: 4.2 g/dL (ref 3.6–5.1)
Alkaline phosphatase (APISO): 84 U/L (ref 35–144)
BUN: 17 mg/dL (ref 7–25)
CO2: 25 mmol/L (ref 20–32)
Calcium: 9.3 mg/dL (ref 8.6–10.3)
Chloride: 105 mmol/L (ref 98–110)
Creat: 1.21 mg/dL (ref 0.70–1.28)
Globulin: 2.8 g/dL (calc) (ref 1.9–3.7)
Glucose, Bld: 75 mg/dL (ref 65–99)
Potassium: 5.1 mmol/L (ref 3.5–5.3)
Sodium: 139 mmol/L (ref 135–146)
Total Bilirubin: 0.4 mg/dL (ref 0.2–1.2)
Total Protein: 7 g/dL (ref 6.1–8.1)
eGFR: 63 mL/min/{1.73_m2} (ref >=60)

## 2022-01-20 LAB — TSH: TSH: 0.61 mIU/L (ref 0.40–4.50)

## 2022-01-20 LAB — LIPID PANEL
Cholesterol: 219 mg/dL — ABNORMAL HIGH (ref <200)
HDL: 63 mg/dL (ref >=40)
LDL Cholesterol (Calc): 122 mg/dL (calc) — ABNORMAL HIGH
Non-HDL Cholesterol (Calc): 156 mg/dL (calc) — ABNORMAL HIGH (ref <130)
Total CHOL/HDL Ratio: 3.5 (calc) (ref <5.0)
Triglycerides: 225 mg/dL — ABNORMAL HIGH (ref <150)

## 2022-01-23 ENCOUNTER — Other Ambulatory Visit: Payer: Self-pay | Admitting: Nurse Practitioner

## 2022-01-29 ENCOUNTER — Ambulatory Visit (HOSPITAL_COMMUNITY)
Admission: RE | Admit: 2022-01-29 | Discharge: 2022-01-29 | Disposition: A | Discharge status: Home / Self Care | Payer: Medicare Other | Source: Ambulatory Visit | Attending: Vascular Surgery | Admitting: Vascular Surgery

## 2022-01-29 DIAGNOSIS — I6522 Occlusion and stenosis of left carotid artery: Secondary | ICD-10-CM | POA: Diagnosis not present

## 2022-01-29 MED ORDER — SODIUM CHLORIDE (PF) 0.9 % IJ SOLN
INTRAMUSCULAR | Status: AC
  Filled 2022-01-29: qty 50

## 2022-01-29 MED ORDER — IOHEXOL 350 MG/ML SOLN
75.0000 mL | Freq: Once | INTRAVENOUS | Status: AC | PRN
  Administered 2022-01-29: 75 mL via INTRAVENOUS

## 2022-01-31 NOTE — Progress Notes (Unsigned)
[facility] [place]  ASSESSMENT / PLAN: 76 y.o. male with proximal, severe left common carotid artery stenosis status post balloon expandable covered stenting 12/09/20. He tolerated this well.  His symptoms of disequilibrium have improved.  Continue aspirin and statin therapy.  CHIEF COMPLAINT: stroke, syncope. Proximal left carotid stenosis on recent CT.  HISTORY OF PRESENT ILLNESS: Christopher Peck is a 76 y.o. male referred to clinic for evaluation of proximal carotid artery stenosis.  Patient has had difficulty with orthostatic hypotension for some time.  Recently he suffered a syncopal event with some seizure-like activity.  Did presentation to the ER, and a referral to Dr. Krista Blue of neurology.  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  He has a history of radiation therapy to the chest for lung cancer 16 years ago.   01/12/2021: Returns after left common carotid artery stenting 12/09/2020.  He tolerated this very well.  He reports his dizzy spells have improved significantly.  He has had no focal symptoms.  02/01/22: Doing well. No new focal / neurologic symptoms. Wants to stop plavix.   Past Medical History:  Diagnosis Date   Emphysema of lung ([place])    History of bilateral inguinal hernia repair 05/11/2009   Hypertension     Past Surgical History:  Procedure Laterality Date   AORTIC ARCH ANGIOGRAPHY N/A 12/09/2020   Procedure: AORTIC ARCH ANGIOGRAPHY;  Surgeon: Cherre Robins, MD;  Location: [place] [facility];  Service: Cardiovascular;  Laterality: N/A;   CATARACT EXTRACTION, BILATERAL     08/21, 09/21   HERNIA REPAIR     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   Procedure: PERIPHERAL VASCULAR INTERVENTION;  Surgeon: Cherre Robins, MD;  Location: [place] [facility];  Service: Cardiovascular;  Laterality: Left;  LCCA    Family History  Problem Relation Age of Onset   COPD Father     Kidney failure Father     Social History   Socioeconomic History   Marital status: Married    Spouse name: Not on file   Number of children: Not on file   Years of education: Not on file   Highest education level: Not on file  Occupational History   Not on file  Tobacco Use   Smoking status: Former    Types: Cigarettes    Quit date: 09/05/2004    Years since quitting: 17.4   Smokeless tobacco: Never  Vaping Use   Vaping Use: Never used  Substance and Sexual Activity   Alcohol use: Not Currently    Alcohol/week: 3.0 standard drinks    Types: 3 Standard drinks or equivalent per week    Comment: none   Drug use: Never   Sexual activity: Not on file  Other Topics Concern   Not on file  Social History Narrative   Right handed   Caffeine use:  1 cup coffee and tea per day   Lives with spouse, Raquel Sarna.   Social Determinants of Health   Financial Resource Strain: Not on file  Food Insecurity: Not on file  Transportation Needs: Not on file  Physical Activity: Not on file  Stress: Not on file  Social Connections: Not on file  Intimate Partner Violence: Not on file    Allergies  Allergen Reactions   Spiractone [Spironolactone]     Facial nerve pain    Current Outpatient Medications  Medication Sig Dispense Refill   albuterol (ACCUNEB) 1.25 MG/3ML nebulizer solution  USE 1 VIAL VIA NEBULIZER EVERY 6 HOURS AS NEEDED FOR WHEEZING 75 mL 20   albuterol (VENTOLIN HFA) 108 (90 Base) MCG/ACT inhaler USE 2 INHALATIONS 15 MINUTES APART EVERY 4 HOURS TO RESCUE ASTHMA 25.5 each 3   citalopram (CELEXA) 20 MG tablet Take  1 tablet  Daily  for Mood                                     /                               TAKE                  BY                   MOUTH 90 tablet 3   clopidogrel (PLAVIX) 75 MG tablet TAKE 1 TABLET BY MOUTH EVERY DAY 90 tablet 3   levothyroxine (SYNTHROID) 125 MCG tablet Take  1 tablet  Daily  on an empty stomach with only water for 30 minutes & no Antacid meds,  Calcium or Magnesium for 4 hours & avoid Biotin 90 tablet 3   No current facility-administered medications for this visit.    PHYSICAL EXAM  Vitals:   02/01/22 0904  BP: 135/72  Pulse: (!) 59  Resp: 18  Temp: 98.1 F (36.7 C)  TempSrc: Temporal  SpO2: 100%  Weight: 220 lb (99.8 kg)  Height: 5\' 11"  (1.803 m)     Constitutional: well appearing. no distress. Appears well nourished.  Neurologic: CN intact. no focal findings. no sensory loss. Psychiatric: Mood and affect symmetric and appropriate. Eyes: No icterus. No conjunctival pallor. Ears, nose, throat: mucous membranes moist. Midline trachea.  Cardiac: regular rate and rhythm.  Respiratory: unlabored. Abdominal: soft, non-tender, non-distended.  Peripheral vascular:  2+ femoral pulses Extremity: No edema. No cyanosis. No pallor.  Skin: No gangrene. No ulceration.  Lymphatic: No Stemmer's sign. No palpable lymphadenopathy.  PERTINENT LABORATORY AND RADIOLOGIC DATA  Most recent CBC    Latest Ref Rng & Units 01/19/2022   11:12 AM 06/24/2021    2:02 PM 12/09/2020   12:04 PM  CBC  WBC 3.8 - 10.8 Thousand/uL 7.3   7.5     Hemoglobin 13.2 - 17.1 g/dL 16.3   14.1   15.3    Hematocrit 38.5 - 50.0 % 48.2   42.5   45.0    Platelets 140 - 400 Thousand/uL 236   214        Most recent CMP    Latest Ref Rng & Units 01/19/2022   11:12 AM 06/24/2021    2:02 PM 12/09/2020   12:04 PM  CMP  Glucose 65 - 99 mg/dL 75   81   91    BUN 7 - 25 mg/dL 17   16   17     Creatinine 0.70 - 1.28 mg/dL 1.21   1.32   1.10    Sodium 135 - 146 mmol/L 139   141   143    Potassium 3.5 - 5.3 mmol/L 5.1   4.2   4.1    Chloride 98 - 110 mmol/L 105   105   102    CO2 20 - 32 mmol/L 25   23     Calcium 8.6 - 10.3 mg/dL 9.3   9.2  Total Protein 6.1 - 8.1 g/dL 7.0   6.6     Total Bilirubin 0.2 - 1.2 mg/dL 0.4   0.4     AST 10 - 35 U/L 23   26     ALT 9 - 46 U/L 14   16       Renal function CrCl cannot be calculated (Unknown ideal  weight.).  Hgb A1c MFr Bld (% of total Hgb)  Date Value  01/19/2022 5.5    LDL Cholesterol (Calc)  Date Value Ref Range Status  01/19/2022 122 (H) mg/dL (calc) Final    Comment:    Reference range: <100 . Desirable range <100 mg/dL for primary prevention;   <70 mg/dL for patients with CHD or diabetic patients  with > or = 2 CHD risk factors. Marland Kitchen LDL-C is now calculated using the Martin-Hopkins  calculation, which is a validated novel method providing  better accuracy than the Friedewald equation in the  estimation of LDL-C.  Cresenciano Genre et al. Annamaria Helling. 7034;035(24): 2061-2068  (http://education.[employer].com/faq/FAQ164)     CT angiogram personally reviewed Continued primary patency of stent without stenosis.  Yevonne Aline. Stanford Breed, MD [facility] Phone Number: [phone] 02/01/2022 9:04 AM

## 2022-02-01 ENCOUNTER — Ambulatory Visit: Payer: Medicare Other | Admitting: Vascular Surgery

## 2022-02-01 ENCOUNTER — Encounter: Payer: Self-pay | Admitting: Vascular Surgery

## 2022-02-01 VITALS — BP 135/72 | HR 59 | Temp 98.1°F | Resp 18 | Ht 71.0 in | Wt 220.0 lb

## 2022-02-01 DIAGNOSIS — Z95828 Presence of other vascular implants and grafts: Secondary | ICD-10-CM | POA: Diagnosis not present

## 2022-02-01 DIAGNOSIS — Z9889 Other specified postprocedural states: Secondary | ICD-10-CM | POA: Diagnosis not present

## 2022-02-02 ENCOUNTER — Encounter: Payer: Self-pay | Admitting: Nurse Practitioner

## 2022-02-02 DIAGNOSIS — Z5320 Procedure and treatment not carried out because of patient's decision for unspecified reasons: Secondary | ICD-10-CM | POA: Insufficient documentation

## 2022-02-04 ENCOUNTER — Other Ambulatory Visit: Payer: Self-pay | Admitting: *Deleted

## 2022-02-04 DIAGNOSIS — Z95828 Presence of other vascular implants and grafts: Secondary | ICD-10-CM

## 2022-02-16 ENCOUNTER — Other Ambulatory Visit: Payer: Self-pay | Admitting: Vascular Surgery

## 2022-03-09 DIAGNOSIS — H0100A Unspecified blepharitis right eye, upper and lower eyelids: Secondary | ICD-10-CM | POA: Diagnosis not present

## 2022-03-09 DIAGNOSIS — H52203 Unspecified astigmatism, bilateral: Secondary | ICD-10-CM | POA: Diagnosis not present

## 2022-03-09 DIAGNOSIS — H43813 Vitreous degeneration, bilateral: Secondary | ICD-10-CM | POA: Diagnosis not present

## 2022-03-09 DIAGNOSIS — Z961 Presence of intraocular lens: Secondary | ICD-10-CM | POA: Diagnosis not present

## 2022-03-16 ENCOUNTER — Other Ambulatory Visit: Payer: Self-pay | Admitting: Adult Health

## 2022-05-12 ENCOUNTER — Telehealth: Payer: Self-pay | Admitting: Internal Medicine

## 2022-05-12 NOTE — Chronic Care Management (AMB) (Signed)
  Chronic Care Management   Note  05/12/2022 Name: Christopher Peck MRN: [id number] DOB: 03-02-1946  Christopher Peck is a 76 y.o. year old male who is a primary care patient of Unk Pinto, MD. I reached out to Gwenevere Abbot by phone today in response to a referral sent by Mr. Christopher Peck's PCP, Unk Pinto, MD.   Christopher Peck was given information about Chronic Care Management services today including:  CCM service includes personalized support from designated clinical staff supervised by his physician, including individualized plan of care and coordination with other care providers 24/7 contact phone numbers for assistance for urgent and routine care needs. Service will only be billed when office clinical staff spend 20 minutes or more in a month to coordinate care. Only one practitioner may furnish and bill the service in a calendar month. The patient may stop CCM services at any time (effective at the end of the month) by phone call to the office staff.   Patient did not agree to enrollment in care management services and does not wish to consider at this time.  Follow up plan:   Tatjana Secretary/administrator

## 2022-06-26 ENCOUNTER — Encounter: Payer: Self-pay | Admitting: Internal Medicine

## 2022-06-26 NOTE — Progress Notes (Unsigned)
Annual  Screening/Preventative Visit  & Comprehensive Evaluation & Examination  Future Appointments  Date Time Provider Department  06/27/2022                                  cpe  2:00 PM Unk Pinto, MD [facility]  12/14/2022                                     wellness 11:00 AM Alycia Rossetti, NP [facility]  07/03/2023                                   cpe  2:00 PM Unk Pinto, MD [facility]             This very nice 76 y.o. MWM with HTN, HLD, Prediabetes and Vitamin D Deficiency presents for a Screening /Preventative Visit & comprehensive evaluation and management of multiple medical co-morbidities.  CT scan in 2010 showed Coronary Aa Atherosclerosis.             In March 2022, patient had a seizure  & was started on Keppra. (followed by Dr Krista Blue)  Patient was also found to have an occluded Lt Common carotid Aa which he had stented in April by Dr Jamelle Haring. Patient also is known to have a Left Parietal lobe lesion unchanged since 2006 & followed by Dr Krista Blue.         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 Previous CXR's have shown COPD. He has refused any CXR or chest CT's since 2017.        Patient has hx/o labile HTN (2012) . Patient's BP has been controlled at home.  Today's BP is                          .   Patient also has hx/o parox. Afib (CHADs2VASC = 2) since age 25 yo & has refused any meds.  Patient denies any cardiac symptoms as chest pain, palpitations, shortness of breath, dizziness or ankle swelling.        Patient's hyperlipidemia is not controlled with diet and patient has been reticient to take meds for Cholesterol.  Last lipids were not at goal :  Lab Results  Component Value Date   CHOL 219 (H) 01/19/2022   HDL 63 01/19/2022   LDLCALC 122 (H) 01/19/2022   TRIG 225 (H) 01/19/2022   CHOLHDL 3.5 01/19/2022         Patient was dx'd Hypothyroid in 2013 and  does take replacement meds.         Patient has been monitored expectantly for glucose intolerance and patient denies reactive hypoglycemic symptoms, visual blurring, diabetic polys or paresthesias. Last A1c was normal & at goal :   Lab Results  Component Value Date   HGBA1C 5.5 01/19/2022         Finally, patient has history of Vitamin D Deficiency  ("34" /2008 &"23" /2016) and last vitamin D was still very low (goal 70-100) and patient refuses to take recommended Vit D supplements:   Lab Results  Component Value Date   VD25OH 36 06/24/2021       Current  Outpatient Medications  Medication Instructions   albuterol (ACCUNEB) 1.25 MG/3ML nebulizer solution USE 1 VIAL VIA NEBULIZER EVERY 6 HOURS AS NEEDED FOR WHEEZING   albuterol (VENTOLIN HFA) 108 (90 Base) MCG/ACT inhaler USE 2 INHALATIONS 15 MINUTES APART EVERY 4 HOURS TO RESCUE ASTHMA   ASPIRIN LOW DOSE 81 MG tablet TAKE 1 TABLET DAILY   citalopram (CELEXA) 20 MG tablet Take  1 tablet  Daily     clopidogrel (PLAVIX) 75 MG tablet TAKE 1 TABLET EVERY DAY   levothyroxine (SYNTHROID) 125 MCG tablet Take  1 tablet  Daily       Allergies  Allergen Reactions   Spiractone [Spironolactone]     Facial nerve pain     Past Medical History:  Diagnosis Date   Emphysema of lung (HCC)    History of bilateral inguinal hernia repair 05/11/2009   Hypertension      Health Maintenance  Topic Date Due   COVID-19 Vaccine (1) Never done   Zoster Vaccines- Shingrix (1 of 2) Never done   INFLUENZA VACCINE  04/05/2021   TETANUS/TDAP  01/02/2022   Pneumonia Vaccine 51+ Years old  Completed   HPV VACCINES  Aged Out    Immunization History  Administered Date(s) Administered   Influenza, High Dose  07/21/2014, 06/19/2018, 05/23/2019, 06/22/2020   Influenza 06/24/2015, 06/07/2017   Pneumococcal -13 06/24/2015   Pneumococcal -23 01/03/2012, 08/23/2017   Td 01/03/2012    Last Colon - always refuses   Past Surgical History:   Procedure Laterality Date   AORTIC ARCH ANGIOGRAPHY N/A 12/09/2020   Procedure: AORTIC ARCH ANGIOGRAPHY;  Surgeon: Cherre Robins, MD;  Location: [facility];  Service: Cardiovascular;  Laterality: N/A;   CATARACT EXTRACTION, BILATERAL     08/21, 09/21   HERNIA REPAIR     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   Procedure: PERIPHERAL VASCULAR INTERVENTION;  Surgeon: Cherre Robins, MD;  Location: [facility];  Service: Cardiovascular;  Laterality: Left;  LCCA     Family History  Problem Relation Age of Onset   COPD Father    Kidney failure Father      Social History   Tobacco Use   Smoking status: Former    Types: Cigarettes    Quit date: 09/05/2004    Years since quitting: 16.8   Smokeless tobacco: Never  Vaping Use   Vaping Use: Never used  Substance Use Topics   Alcohol use: Not Currently    Alcohol/week: 3.0 standard drinks    Types: 3 Standard drinks or equivalent per week    Comment: none   Drug use: Never      ROS Constitutional: Denies fever, chills, weight loss/gain, headaches, insomnia,  night sweats or change in appetite. Does c/o fatigue. Eyes: Denies redness, blurred vision, diplopia, discharge, itchy or watery eyes.  ENT: Denies discharge, congestion, post nasal drip, epistaxis, sore throat, earache, hearing loss, dental pain, Tinnitus, Vertigo, Sinus pain or snoring.  Cardio: Denies chest pain, palpitations, irregular heartbeat, syncope, dyspnea, diaphoresis, orthopnea, PND, claudication or edema Respiratory: denies cough, dyspnea, DOE, pleurisy, hoarseness, laryngitis or wheezing.  Gastrointestinal: Denies dysphagia, heartburn, reflux, water brash, pain, cramps, nausea, vomiting, bloating, diarrhea, constipation, hematemesis, melena, hematochezia, jaundice or hemorrhoids Genitourinary: Denies dysuria, frequency, urgency,  discharge, hematuria or flank pain. Has occasional urgency, nocturia x 1-2 x & occasional  hesitancy. Musculoskeletal: Denies arthralgia, myalgia, stiffness, Jt. Swelling, pain, limp or strain/sprain. Denies Falls. Skin: Denies puritis, rash, hives, warts, acne, eczema or change in  skin lesion Neuro: No weakness, tremor, incoordination, spasms, paresthesia or pain Psychiatric: Denies confusion, memory loss or sensory loss. Denies Depression. Endocrine: Denies change in weight, skin, hair change, nocturia, and paresthesia, diabetic polys, visual blurring or hyper / hypo glycemic episodes.  Heme/Lymph: No excessive bleeding, bruising or enlarged lymph nodes.   Physical Exam  There were no vitals taken for this visit.  General Appearance: Well nourished and well groomed and in no apparent distress.  Eyes: PERRLA, EOMs, conjunctiva no swelling or erythema, normal fundi and vessels. Sinuses: No frontal/maxillary tenderness ENT/Mouth: EACs patent / TMs  nl. Nares clear without erythema, swelling, mucoid exudates. Oral hygiene is good. No erythema, swelling, or exudate. Tongue normal, non-obstructing. Tonsils not swollen or erythematous. Hearing normal.  Neck: Supple, thyroid not palpable. No bruits, nodes or JVD. Respiratory: Respiratory effort normal.  BS equal and clear bilateral without rales, rhonci, wheezing or stridor. Cardio: Heart sounds are normal with regular rate and rhythm and no murmurs, rubs or gallops. Peripheral pulses are normal and equal bilaterally without edema. No aortic or femoral bruits. Chest: symmetric with normal excursions and percussion.  Abdomen: Soft, with Nl bowel sounds. Nontender, no guarding, rebound, hernias, masses, or organomegaly.  Lymphatics: Non tender without lymphadenopathy.  Musculoskeletal: Full ROM all peripheral extremities, joint stability, 5/5 strength, and normal gait. Skin: Warm and dry without rashes, lesions, cyanosis, clubbing or  ecchymosis.  Neuro: Cranial nerves intact, reflexes equal bilaterally. Normal muscle tone, no cerebellar  symptoms. Sensation intact.  Pysch: Alert and oriented X 3 with normal affect, insight and judgment appropriate.   Assessment and Plan  1. Annual Preventative/Screening Exam    2. Labile hypertension  - EKG 12-Lead - [place], RETROPERITNL ABD,  LTD - Urinalysis, Routine w reflex microscopic - Microalbumin / creatinine urine ratio - CBC with Differential/Platelet - COMPLETE METABOLIC PANEL WITH GFR - Magnesium - TSH  3. Hyperlipidemia, mixed  - EKG 12-Lead - [place], RETROPERITNL ABD,  LTD - Lipid panel - TSH  4. Abnormal glucose  - EKG 12-Lead - [place], RETROPERITNL ABD,  LTD - Hemoglobin A1c - Insulin, random  5. Vitamin D deficiency  - VITAMIN D 25 Hydroxy  6. Hypothyroidism - TSH  7. Atherosclerosis of native coronary artery  of native heart without angina pectoris  - EKG 12-Lead - Lipid panel  8. BPH without obstruction/lower urinary tract symptoms  - PSA  9. 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 Seizures (HCC)  - Levetiracetam level  12. Screening for colorectal cancer  - POC Hemoccult Bld/Stl   13. Screening for heart disease  - EKG 12-Lead  14. Former smoker  - EKG 12-Lead - [place], RETROPERITNL ABD,  LTD - Lipid panel  15. Screening for AAA (aortic abdominal aneurysm)  - [place], RETROPERITNL ABD,  LTD  16. Medication management  - CBC with Differential/Platelet - COMPLETE METABOLIC PANEL WITH GFR - Magnesium - Lipid panel - TSH - Hemoglobin A1c - Insulin, random - VITAMIN D 25 Hydroxy - Levetiracetam level          Patient was counseled in prudent diet, weight control to achieve/maintain BMI less than 25, BP monitoring, regular exercise and medications as discussed.  Discussed med effects and SE's. Routine screening labs and tests as requested with regular follow-up as recommended. Over 40 minutes of exam, counseling, chart review and high complex critical decision making was performed   Kirtland Bouchard, MD

## 2022-06-26 NOTE — Patient Instructions (Signed)

## 2022-06-27 ENCOUNTER — Ambulatory Visit (INDEPENDENT_AMBULATORY_CARE_PROVIDER_SITE_OTHER): Payer: Medicare Other | Admitting: Internal Medicine

## 2022-06-27 ENCOUNTER — Encounter: Payer: Self-pay | Admitting: Internal Medicine

## 2022-06-27 VITALS — BP 138/78 | HR 68 | Temp 97.9°F | Resp 18 | Ht 69.0 in | Wt 227.2 lb

## 2022-06-27 DIAGNOSIS — R0989 Other specified symptoms and signs involving the circulatory and respiratory systems: Secondary | ICD-10-CM

## 2022-06-27 DIAGNOSIS — I700 Atherosclerosis of aorta: Secondary | ICD-10-CM

## 2022-06-27 DIAGNOSIS — N400 Enlarged prostate without lower urinary tract symptoms: Secondary | ICD-10-CM

## 2022-06-27 DIAGNOSIS — Z23 Encounter for immunization: Secondary | ICD-10-CM

## 2022-06-27 DIAGNOSIS — R569 Unspecified convulsions: Secondary | ICD-10-CM

## 2022-06-27 DIAGNOSIS — Z0001 Encounter for general adult medical examination with abnormal findings: Secondary | ICD-10-CM

## 2022-06-27 DIAGNOSIS — R7309 Other abnormal glucose: Secondary | ICD-10-CM | POA: Diagnosis not present

## 2022-06-27 DIAGNOSIS — Z87891 Personal history of nicotine dependence: Secondary | ICD-10-CM

## 2022-06-27 DIAGNOSIS — E782 Mixed hyperlipidemia: Secondary | ICD-10-CM | POA: Diagnosis not present

## 2022-06-27 DIAGNOSIS — Z125 Encounter for screening for malignant neoplasm of prostate: Secondary | ICD-10-CM

## 2022-06-27 DIAGNOSIS — Z136 Encounter for screening for cardiovascular disorders: Secondary | ICD-10-CM | POA: Diagnosis not present

## 2022-06-27 DIAGNOSIS — Z0000 Encounter for general adult medical examination without abnormal findings: Secondary | ICD-10-CM

## 2022-06-27 DIAGNOSIS — E559 Vitamin D deficiency, unspecified: Secondary | ICD-10-CM

## 2022-06-27 DIAGNOSIS — E039 Hypothyroidism, unspecified: Secondary | ICD-10-CM

## 2022-06-27 DIAGNOSIS — Z79899 Other long term (current) drug therapy: Secondary | ICD-10-CM

## 2022-06-27 DIAGNOSIS — I2510 Atherosclerotic heart disease of native coronary artery without angina pectoris: Secondary | ICD-10-CM

## 2022-06-27 DIAGNOSIS — J701 Chronic and other pulmonary manifestations due to radiation: Secondary | ICD-10-CM

## 2022-06-27 DIAGNOSIS — Z1211 Encounter for screening for malignant neoplasm of colon: Secondary | ICD-10-CM

## 2022-06-28 ENCOUNTER — Other Ambulatory Visit: Payer: Self-pay | Admitting: Internal Medicine

## 2022-06-28 DIAGNOSIS — E039 Hypothyroidism, unspecified: Secondary | ICD-10-CM

## 2022-06-28 NOTE — Progress Notes (Signed)
<><><><><><><><><><><><><><><><><><><><><><><><><><><><><><><><><> <><><><><><><><><><><><><><><><><><><><><><><><><><><><><><><><><>  -   TSH    is very low which means thyroid hormone is too high in blood, So   Recommend Decrease  to 1 whole tablet     3 x   /week     on Mon Wed & Fri  &                                             1/2 tablet           4 x   /week      on   Tues Thurs Sat Sun   - And call office to schedule a Nurse visit in re-check thyroid level in 1 month   <><><><><><><><><><><><><><><><><><><><><><><><><><><><><><><><><> <><><><><><><><><><><><><><><><><><><><><><><><><><><><><><><><><>  -  PSA - Very low  - Great !    No Prostate cancer <><><><><><><><><><><><><><><><><><><><><><><><><><><><><><><><><>  - A1c  - Normal - No Diabetes  - Great  ! <><><><><><><><><><><><><><><><><><><><><><><><><><><><><><><><><>  - Vitamin D = 33 - Very Very low  ( is bad )   - Vitamin D goal is between 70-100.  -.-.-.-.-.-.-.-.-.-.-.-.-.-.-.-.-.-.-.-.-.-.-.-.-.-.-.-.-.-.-.-.-.-.-.-.-.-.-.-.-.-.-.-.-.-.-.-.-.-.-.-.-.-.-.-.-.-.-.-.-.-.-.- -.-.-.-.-.-.-.-.-.-.-.-.-.-.-.-.-.-.-.-.-.-.-.-.-.-.-.-.-.-.-.-.-.-.-.-.-.-.-.-.-.-.-.-.-.-.-.-.-.-.-.-.-.-.-.-.-.-.-.-.-.-.-.-  - Please let me know how much Vitamin D that you're taking ,                              so I can calculate a new dose to take your Vitamin D as directed.   -.-.-.-.-.-.-.-.-.-.-.-.-.-.-.-.-.-.-.-.-.-.-.-.-.-.-.-.-.-.-.-.-.-.-.-.-.-.-.-.-.-.-.-.-.-.-.-.-.-.-.-.-.-.-.-.-.-.-.-.-.-.-.- -.-.-.-.-.-.-.-.-.-.-.-.-.-.-.-.-.-.-.-.-.-.-.-.-.-.-.-.-.-.-.-.-.-.-.-.-.-.-.-.-.-.-.-.-.-.-.-.-.-.-.-.-.-.-.-.-.-.-.-.-.-.-.-  - It is very important as a natural anti-inflammatory and helping the  immune system protect against viral infections, like the Covid-19   helping hair, skin, and nails, as well as reducing stroke and  heart attack risk.   - It helps your bones and helps with mood.  - It also decreases numerous cancer risks  so please  take it as directed.   - Low Vit D is associated with a 200-300% higher risk for  CANCER   and 200-300% higher risk for HEART   ATTACK  &  STROKE.    - It is also associated with higher death rate at younger ages,   autoimmune diseases like Rheumatoid arthritis, Lupus,  Multiple Sclerosis.     - Also many other serious conditions, like depression, Alzheimer's  Dementia, infertility, muscle aches, fatigue, fibromyalgia  <><><><><><><><><><><><><><><><><><><><><><><><><><><><><><><><><>  - All Else - CBC - Kidneys - Electrolytes - Liver - Magnesium & Thyroid    - all  Normal / OK <><><><><><><><><><><><><><><><><><><><><><><><><><><><><><><><><> <><><><><><><><><><><><><><><><><><><><><><><><><><><><><><><><><> -

## 2022-07-03 LAB — COMPLETE METABOLIC PANEL WITH GFR
AG Ratio: 1.5 (calc) (ref 1.0–2.5)
ALT: 13 U/L (ref 9–46)
AST: 20 U/L (ref 10–35)
Albumin: 4 g/dL (ref 3.6–5.1)
Alkaline phosphatase (APISO): 90 U/L (ref 35–144)
BUN: 16 mg/dL (ref 7–25)
CO2: 27 mmol/L (ref 20–32)
Calcium: 9.3 mg/dL (ref 8.6–10.3)
Chloride: 105 mmol/L (ref 98–110)
Creat: 1.18 mg/dL (ref 0.70–1.28)
Globulin: 2.7 g/dL (calc) (ref 1.9–3.7)
Glucose, Bld: 88 mg/dL (ref 65–99)
Potassium: 4.4 mmol/L (ref 3.5–5.3)
Sodium: 141 mmol/L (ref 135–146)
Total Bilirubin: 0.4 mg/dL (ref 0.2–1.2)
Total Protein: 6.7 g/dL (ref 6.1–8.1)
eGFR: 65 mL/min/{1.73_m2} (ref >=60)

## 2022-07-03 LAB — CBC WITH DIFFERENTIAL/PLATELET
Absolute Monocytes: 1056 cells/uL — ABNORMAL HIGH (ref 200–950)
Basophils Absolute: 91 cells/uL (ref 0–200)
Basophils Relative: 1.2 %
Eosinophils Absolute: 198 cells/uL (ref 15–500)
Eosinophils Relative: 2.6 %
HCT: 46.7 % (ref 38.5–50.0)
Hemoglobin: 15.4 g/dL (ref 13.2–17.1)
Lymphs Abs: 1474 cells/uL (ref 850–3900)
MCH: 32.3 pg (ref 27.0–33.0)
MCHC: 33 g/dL (ref 32.0–36.0)
MCV: 97.9 fL (ref 80.0–100.0)
MPV: 9.9 fL (ref 7.5–12.5)
Monocytes Relative: 13.9 %
Neutro Abs: 4780 cells/uL (ref 1500–7800)
Neutrophils Relative %: 62.9 %
Platelets: 252 10*3/uL (ref 140–400)
RBC: 4.77 10*6/uL (ref 4.20–5.80)
RDW: 12 % (ref 11.0–15.0)
Total Lymphocyte: 19.4 %
WBC: 7.6 10*3/uL (ref 3.8–10.8)

## 2022-07-03 LAB — URINALYSIS, ROUTINE W REFLEX MICROSCOPIC
Bacteria, UA: NONE SEEN /HPF
Bilirubin Urine: NEGATIVE
Glucose, UA: NEGATIVE
Hgb urine dipstick: NEGATIVE
Hyaline Cast: NONE SEEN /LPF
Ketones, ur: NEGATIVE
Leukocytes,Ua: NEGATIVE
Nitrite: NEGATIVE
RBC / HPF: NONE SEEN /HPF (ref 0–2)
Specific Gravity, Urine: 1.014 (ref 1.001–1.035)
Squamous Epithelial / HPF: NONE SEEN /HPF (ref <=5)
WBC, UA: NONE SEEN /HPF (ref 0–5)
pH: 6.5 (ref 5.0–8.0)

## 2022-07-03 LAB — MICROSCOPIC MESSAGE

## 2022-07-03 LAB — INSULIN, RANDOM: Insulin: 11.3 u[IU]/mL

## 2022-07-03 LAB — LIPID PANEL
Cholesterol: 184 mg/dL (ref <200)
HDL: 56 mg/dL (ref >=40)
LDL Cholesterol (Calc): 106 mg/dL (calc) — ABNORMAL HIGH
Non-HDL Cholesterol (Calc): 128 mg/dL (calc) (ref <130)
Total CHOL/HDL Ratio: 3.3 (calc) (ref <5.0)
Triglycerides: 128 mg/dL (ref <150)

## 2022-07-03 LAB — VITAMIN D 25 HYDROXY (VIT D DEFICIENCY, FRACTURES): Vit D, 25-Hydroxy: 33 ng/mL (ref 30–100)

## 2022-07-03 LAB — HEMOGLOBIN A1C
Hgb A1c MFr Bld: 5.5 % of total Hgb (ref <5.7)
Mean Plasma Glucose: 111 mg/dL
eAG (mmol/L): 6.2 mmol/L

## 2022-07-03 LAB — LEVETIRACETAM LEVEL: Keppra (Levetiracetam): <2 ug/mL — ABNORMAL LOW

## 2022-07-03 LAB — MAGNESIUM: Magnesium: 2.1 mg/dL (ref 1.5–2.5)

## 2022-07-03 LAB — TSH: TSH: 0.04 mIU/L — ABNORMAL LOW (ref 0.40–4.50)

## 2022-07-03 LAB — MICROALBUMIN / CREATININE URINE RATIO
Creatinine, Urine: 114 mg/dL (ref 20–320)
Microalb Creat Ratio: 53 mcg/mg creat — ABNORMAL HIGH (ref <30)
Microalb, Ur: 6 mg/dL

## 2022-07-03 LAB — PSA: PSA: 0.38 ng/mL (ref <=4.00)

## 2022-07-19 ENCOUNTER — Other Ambulatory Visit: Payer: Self-pay | Admitting: Internal Medicine

## 2022-07-19 DIAGNOSIS — J4520 Mild intermittent asthma, uncomplicated: Secondary | ICD-10-CM

## 2022-07-19 MED ORDER — ALBUTEROL SULFATE HFA 108 (90 BASE) MCG/ACT IN AERS: INHALATION_SPRAY | RESPIRATORY_TRACT | 3 refills | Status: DC

## 2022-08-22 ENCOUNTER — Other Ambulatory Visit: Payer: Self-pay | Admitting: Internal Medicine

## 2022-08-22 DIAGNOSIS — J4540 Moderate persistent asthma, uncomplicated: Secondary | ICD-10-CM

## 2022-08-22 MED ORDER — BREZTRI AEROSPHERE 160-9-4.8 MCG/ACT IN AERO: INHALATION_SPRAY | RESPIRATORY_TRACT | 3 refills | Status: DC

## 2022-08-30 ENCOUNTER — Encounter: Payer: Self-pay | Admitting: Internal Medicine

## 2022-08-30 ENCOUNTER — Other Ambulatory Visit: Payer: Self-pay

## 2022-08-30 ENCOUNTER — Ambulatory Visit (INDEPENDENT_AMBULATORY_CARE_PROVIDER_SITE_OTHER): Payer: Medicare Other | Admitting: Internal Medicine

## 2022-08-30 VITALS — BP 142/88 | HR 88 | Temp 97.2°F | Resp 20 | Ht 69.0 in | Wt 226.8 lb

## 2022-08-30 DIAGNOSIS — J22 Unspecified acute lower respiratory infection: Secondary | ICD-10-CM | POA: Diagnosis not present

## 2022-08-30 DIAGNOSIS — Z1152 Encounter for screening for COVID-19: Secondary | ICD-10-CM

## 2022-08-30 DIAGNOSIS — J069 Acute upper respiratory infection, unspecified: Secondary | ICD-10-CM | POA: Diagnosis not present

## 2022-08-30 DIAGNOSIS — R6889 Other general symptoms and signs: Secondary | ICD-10-CM | POA: Diagnosis not present

## 2022-08-30 LAB — POCT INFLUENZA A/B
Influenza A, POC: NEGATIVE
Influenza B, POC: NEGATIVE

## 2022-08-30 LAB — POC COVID19 BINAXNOW: SARS Coronavirus 2 Ag: NEGATIVE

## 2022-08-30 MED ORDER — BENZONATATE 200 MG PO CAPS: ORAL_CAPSULE | ORAL | 1 refills | Status: DC

## 2022-08-30 MED ORDER — DEXAMETHASONE 4 MG PO TABS: ORAL_TABLET | ORAL | 0 refills | Status: DC

## 2022-08-30 NOTE — Progress Notes (Signed)
Future Appointments  Date Time Provider Department  08/30/2022  3:30 PM Unk Pinto, MD [facility]  09/27/2022  2:30 PM Alycia Rossetti, NP [facility]  01/02/2023  2:30 PM Unk Pinto, MD [facility]  07/03/2023  2:00 PM Unk Pinto, MD [facility]    History of Present Illness:      The patient is a very nice 76 y.o. 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 Now over the last 1-2 days he has developed today with c/o  a dry persistent hacking cough. Denies any sputum, wheezing or dyspnea.    Medications     levothyroxine (SYNTHROID) 125 MCG tablet, Take  1 tablet  Daily  BREZTRI AEROSPHERE 160-9-4.8 MCG/ACT AERO, Use  2 inhalations  15 minutes apart  2 x /day (every 12 hours)   albuterol (ACCUNEB) 1.25 MG/3ML nebulizer solution, USE 1 VIAL VIA NEBULIZER EVERY 6 HOURS AS NEEDED FOR WHEEZING   albuterol (VENTOLIN HFAinhaler, Use  2 inhalations  15 minutes apart  every 4 hours  as needed to Rescue Asthma Attack   ASPIRIN LOW DOSE 81 MG tablet, TAKE 1 TABLET  DAILY   citalopram (CELEXA) 20 MG tablet, Take  1 tablet  Daily  for Mood                                     /                                 Problem list He has CORONARY ATHEROSCLEROSIS by CT scan in 2010; 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   Observations/Objective:  BP (!)  142/88   Pulse 88   Temp (!) 97.2 F (36.2 C)   Resp 20   Ht 5\' 9"  (1.753 m)   Wt 226 lb 12.8 oz (102.9 kg)   SpO2 93%   BMI 33.49 kg/m   Dry hacking tickle type cough. No Stridor.   HEENT - WNL. Neck - supple.  Chest - CBS decreased with few scattered rales. Cor - Nl HS. RRR w/o sig MGR. PP 1(+). No edema. MS- FROM w/o deformities.  Gait Nl. Neuro -  Nl w/o focal abnormalities.   Assessment and Plan:  1. Lower respiratory infection  - dexamethasone  4 MG tablet; \ Take 2 tablets Now & 1 tablet at Bedtime today,  then tomorrow start 1 tab 3 x day for 2 days,  then 2 x day for  3 days, then 1 tab daily  Dispense: 20 tablet  - benzonatate  200 MG capsule;  Take 1 perle 3 x / day to prevent cough   Dispense: 30 capsule; Refill: 1   2. Viral upper respiratory tract infection  - dexamethasone (DECADRON) 4 MG tablet; Take 2 tablets Now & 1 tablet at Bedtime today, then tomorrow start 1 tab 3  x day for 2 days, then 2 x day for  3 days, then 1 tab daily  Dispense: 20 tablet; Refill: 0   3. Flu-like symptoms  - POCT Influenza A/B - >Negative   4. Encounter for screening for COVID-19  - POC COVID-19 - >  Negative   Follow Up Instructions:        I discussed the assessment and treatment plan with the patient. The patient was provided an opportunity to ask questions and all were answered. The patient agreed with the plan and demonstrated an understanding of the instructions.       The patient was advised to call back or seek an in-person evaluation if the symptoms worsen or if the condition fails to improve as anticipated. Patient given strict return  or ER precautions.     Kirtland Bouchard, MD

## 2022-08-30 NOTE — Patient Instructions (Signed)
Viral Respiratory Infection A respiratory infection is an illness that affects part of the respiratory system, such as the lungs, nose, or throat. A respiratory infection that is caused by a virus is called a viral respiratory infection. Common types of viral respiratory infections include: A cold. The flu (influenza). A respiratory syncytial virus (RSV) infection. What are the causes? This condition is caused by a virus. The virus may spread through contact with droplets or direct contact with infected people or their mucus or secretions. The virus may spread from person to person (is contagious). What are the signs or symptoms? Symptoms of this condition include: A stuffy or runny nose. A sore throat or cough. Shortness of breath or difficulty breathing. Yellow or green mucus (sputum). Other symptoms may include: A fever. Sweating or chills. Fatigue. Achy muscles. A headache. How is this diagnosed? This condition may be diagnosed based on: Your symptoms. A physical exam. Testing of secretions from the nose or throat. Chest X-ray. How is this treated? This condition may be treated with medicines, such as: Antiviral medicine. This may shorten the length of time a person has symptoms. Expectorants. These make it easier to cough up mucus. Decongestant nasal sprays. Acetaminophen or NSAIDs, such as ibuprofen, to relieve fever and pain. Antibiotic medicines are not prescribed for viral infections.This is because antibiotics are designed to kill bacteria. They do not kill viruses. Follow these instructions at home: Managing pain and congestion Take over-the-counter and prescription medicines only as told by your health care provider. If you have a sore throat, gargle with a mixture of salt and water 3-4 times a day or as needed. To make salt water, completely dissolve -1 tsp (3-6 g) of salt in 1 cup (237 mL) of warm water. Use nose drops made from salt water to ease congestion and  soften raw skin around your nose. Take 2 tsp (10 mL) of honey at bedtime to lessen coughing at night. Do not give honey to children who are younger than 1 year. Drink enough fluid to keep your urine pale yellow. This helps prevent dehydration and helps loosen up mucus. General instructions  Rest as much as possible. Do not drink alcohol. Do not use any products that contain nicotine or tobacco. These products include cigarettes, chewing tobacco, and vaping devices, such as e-cigarettes. If you need help quitting, ask your health care provider. Keep all follow-up visits. This is important. How is this prevented?     Get an annual flu shot. You may get the flu shot in late summer, fall, or winter. Ask your health care provider when you should get your flu shot. Avoid spreading your infection to other people. If you are sick: Wash your hands with soap and water often, especially after you cough or sneeze. Wash for at least 20 seconds. If soap and water are not available, use alcohol-based hand sanitizer. Cover your mouth when you cough. Cover your nose and mouth when you sneeze. Do not share cups or eating utensils. Clean commonly used objects often. Clean commonly touched surfaces. Stay home from work or school as told by your health care provider. Avoid contact with people who are sick during cold and flu season. This is generally fall and winter. Contact a health care provider if: Your symptoms last for 10 days or longer. Your symptoms get worse over time. You have severe sinus pain in your face or forehead. The glands in your jaw or neck become very swollen. You have shortness of breath. Get  help right away if you: Feel pain or pressure in your chest. Have trouble breathing. Faint or feel like you will faint. Have severe and persistent vomiting. Feel confused or disoriented. These symptoms may represent a serious problem that is an emergency. Do not wait to see if the symptoms will  go away. Get medical help right away. Call your local emergency services (911 in the [place] Do not drive yourself to the hospital. Summary A respiratory infection is an illness that affects part of the respiratory system, such as the lungs, nose, or throat. A respiratory infection that is caused by a virus is called a viral respiratory infection. Common types of viral respiratory infections include a cold, influenza, and respiratory syncytial virus (RSV) infection. Symptoms of this condition include a stuffy or runny nose, cough, fatigue, achy muscles, sore throat, and fevers or chills. Antibiotic medicines are not prescribed for viral infections. This is because antibiotics are designed to kill bacteria. They are not effective against viruses. This information is not intended to replace advice given to you by your health care provider. Make sure you discuss any questions you have with your health care provider. Document Revised: 11/26/2020 Document Reviewed: 11/26/2020 Elsevier Patient Education  [place].

## 2022-09-07 ENCOUNTER — Other Ambulatory Visit: Payer: Self-pay | Admitting: Nurse Practitioner

## 2022-09-12 ENCOUNTER — Other Ambulatory Visit: Payer: Self-pay | Admitting: Nurse Practitioner

## 2022-09-12 DIAGNOSIS — J4520 Mild intermittent asthma, uncomplicated: Secondary | ICD-10-CM

## 2022-09-26 NOTE — Progress Notes (Signed)
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 smoking history -pateint cannot tolerated inhaled corticosteroids they cause CP - Not followed by pulmonology, recommended for PFT's for possible O2 therapy but pt declines  Medication management - CBC with Differential/Platelet - CMP WITH GFR  Radiation fibrosis of lung (HCC) -he does not see pulmonology and declines referral for pulmonary function tests  Personal history of lung cancer -resolved s/p radiation -followed by oncology  Hypothyroidism, unspecified type -TSH -cont levothyroxine -dose adjust if necessary   BPH/Prostatism -cont finasteride -pateint declined further medication  Hyperlipidemia, diet controlled - has been recommended statin therapy and pt declines - lipid panel   Other nonthrombocytopenic purpura - CBC - Monitor for bruising and bleeding due to Plavix  Other abnormal glucose -cont diet and exercise   Vitamin D deficiency -cont Vit D supplement  Over 30 minutes of exam, counseling, chart review, and critical decision making was performed  Future Appointments  Date Time Provider Department Center  01/02/2023  2:30 PM Lucky Cowboy, MD [facility] None  07/03/2023  2:00 PM Lucky Cowboy, MD [facility] None     Plan:   During the course of the  visit the patient was educated and counseled about appropriate screening and preventive services including:   Pneumococcal vaccine  Influenza vaccine Prevnar 13 Td vaccine Screening electrocardiogram Colorectal cancer screening Diabetes screening Glaucoma screening Nutrition counseling    Subjective:  Christopher Peck is a 77 y.o. male who presents for Medicare Annual Wellness Visit and 3 month follow up for HTN, hyperlipidemia, glucose management, and vitamin D Def.   Continues to have  insomnia- currently not using medication. Since he had the lung infection and trouble with Markus Daft he gets anxious sleeping flat so sleeping in a recliner  Follows with Dr. Lenell Antu for carotid artery stenosis. He had Left common carotid artery stenting 12/09/20. 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 He refuses to have any further CXR's or CT scans. Brother has pulmonary fibrosis from cotton mills, wife had shingles. Continues to have a little bit of dry cough, unable to get phlegm up. Does not follow with pulmonology  BMI is Body mass index is 32.22 kg/m., he has not been working on diet and exercise, he is not interested in working on this.  Wt Readings from Last 3 Encounters:  09/27/22 218 lb 3.2 oz (99 kg)  08/30/22 226 lb 12.8 oz (102.9 kg)  06/27/22 227 lb 3.2 oz (103.1 kg)   His blood pressure has been controlled at home, today their BP is BP: 110/70  BP Readings from Last 3 Encounters:  09/27/22 110/70  08/30/22 (!) 142/88  06/27/22 138/78  He does not workout. He denies chest pain, dizziness headaches  He does have some palpitations that  occur with anxiety and use of Albuterol inhaler.   He is not on cholesterol medication and declines use of meds. His cholesterol is not at goal. The cholesterol last visit was:   Lab  Results  Component Value Date   CHOL 184 06/27/2022   HDL 56 06/27/2022   LDLCALC 106 (H) 06/27/2022   TRIG 128 06/27/2022   CHOLHDL 3.3 06/27/2022    He has been working on diet and exercise for prediabetes, and denies increased appetite, nausea, paresthesia of the feet, polydipsia, polyuria, visual disturbances and vomiting. Last A1C in the office was:  Lab Results  Component Value Date   HGBA1C 5.5 06/27/2022   He is on thyroid medication. He take whole pill M-W-F and then 1/2 the other days. His medication was not changed last visit.   Lab Results  Component Value Date   TSH 0.04 (L) 06/27/2022  .  Last GFR Lab Results  Component Value Date   EGFR 65 06/27/2022    Patient is not on Vitamin D supplement, declines to supplement, takes a multivitamin.  Lab Results  Component Value Date   VD25OH 33 06/27/2022       Medication Review: Current Outpatient Medications on File Prior to Visit  Medication Sig Dispense Refill   albuterol (ACCUNEB) 1.25 MG/3ML nebulizer solution USE 1 VIAL VIA NEBULIZER EVERY 6 HOURS AS NEEDED FOR WHEEZING 75 mL 20   albuterol (VENTOLIN HFA) 108 (90 Base) MCG/ACT inhaler Use  2 inhalations  15 minutes apart  every 4 hours  as needed to Rescue Asthma Attack 48 g 3   citalopram (CELEXA) 20 MG tablet Take  1 tablet  Daily  for Mood                                     /                               TAKE                  BY                   MOUTH 90 tablet 3   finasteride (PROSCAR) 5 MG tablet Take 1 tablet (5 mg total) by mouth daily. 90 tablet 1   levothyroxine (SYNTHROID) 125 MCG tablet Take  1 tablet  Daily  on an empty stomach with only water for 30 minutes & no Antacid meds, Calcium or Magnesium for 4 hours & avoid Biotin 90 tablet 3   benzonatate (TESSALON) 200 MG capsule Take 1 perle 3 x / day to prevent cough (Patient not taking: Reported on 09/27/2022) 30 capsule 1   Budeson-Glycopyrrol-Formoterol (BREZTRI AEROSPHERE) 160-9-4.8 MCG/ACT AERO Use   2 inhalations  15 minutes apart  2 x /day (every 12 hours) (Patient not taking: Reported on 09/27/2022) 32.7 g 3   dexamethasone (DECADRON) 4 MG tablet Take 2 tablets Now & 1 tablet at Bedtime today, then tomorrow start 1 tab 3 x day for 2 days, then 2 x day for  3 days, then 1 tab daily (Patient not taking: Reported on 09/27/2022) 20 tablet 0   No current facility-administered medications on file prior to visit.    Allergies: Allergies  Allergen Reactions   Breztri Aerosphere [Budeson-Glycopyrrol-Formoterol]     Bad weezing    Spiractone [Spironolactone]  Facial nerve pain    Current Problems (verified) has CORONARY ATHEROSCLEROSIS by CT scan in 2010; Radiation fibrosis of lung (HCC); Labile hypertension; Vitamin D deficiency; Hyperlipidemia, mixed; Hypothyroidism; COPD; BPH/Prostatism; Depression, major, recurrent, in partial remission (HCC); Abnormal glucose; Overweight (BMI 25.0-29.9); Seizures (HCC); Cerebral infarction due to thrombosis of left carotid artery (HCC); Peripheral neuropathy; Gait abnormality; Abnormal finding on MRI of brain; Left parietal lobe lesion; Hypotension; and Statin declined on their problem list.  Screening Tests Immunization History  Administered Date(s) Administered   Influenza, High Dose Seasonal PF 07/21/2014, 06/19/2018, 05/23/2019, 06/22/2020, 06/27/2022   Influenza-Unspecified 06/24/2015, 06/07/2017, 06/24/2021   PNEUMOCOCCAL CONJUGATE-20 06/27/2022   Pneumococcal Conjugate-13 06/24/2015   Pneumococcal Polysaccharide-23 01/03/2012, 08/23/2017   Td 01/03/2012    Preventative care: Last colonoscopy: never has had, refuses colonoscopy or cologuard, does hemoccult annually at CPE  Tetanus: 2013 Influenza: 2019 Pneumonia 2018 Prevnar 13: 2016 Shingles: declines  Names of Other Physician/Practitioners you currently use: 1. [facility] here for primary care 2. Dr. Burgess Estelle, eye doctor, has appt scheduled  03/2022 3. Does not see, dentist, last visit 2018, full dentures   Patient Care Team: Lucky Cowboy, MD as PCP - General (Internal Medicine) Si Gaul, MD as Consulting Physician (Oncology) Jethro Bolus, MD as Consulting Physician (Ophthalmology)  Surgical: He  has a past surgical history that includes Hernia repair; Cataract extraction, bilateral; AORTIC ARCH ANGIOGRAPHY (N/A, 12/09/2020); and PERIPHERAL VASCULAR INTERVENTION (Left, 12/09/2020). Family His family history includes COPD in his father; Kidney failure in his father. Social history  He reports that he quit smoking about 18 years ago. His smoking use included cigarettes. He has never used smokeless tobacco. He reports that he does not currently use alcohol after a past usage of about 3.0 standard drinks of alcohol per week. He reports that he does not use drugs.  MEDICARE WELLNESS OBJECTIVES: Physical activity: Current Exercise Habits: The patient does not participate in regular exercise at present, Exercise limited by: respiratory conditions(s) Cardiac risk factors: Cardiac Risk Factors include: advanced age (>73men, >40 women);dyslipidemia;hypertension;male gender;sedentary lifestyle;obesity (BMI >30kg/m2);smoking/ tobacco exposure Depression/mood screen:      09/27/2022    2:41 PM  Depression screen PHQ 2/9  Decreased Interest 0  Down, Depressed, Hopeless 0  PHQ - 2 Score 0    ADLs:     09/27/2022    2:43 PM 09/27/2022    2:42 PM  In your present state of health, do you have any difficulty performing the following activities:  Hearing? 0 0  Vision? 0   Difficulty concentrating or making decisions? 0   Walking or climbing stairs? 0   Dressing or bathing? 0   Doing errands, shopping? 0      Cognitive Testing  Alert? Yes  Normal Appearance?Yes  Oriented to person? Yes  Place? Yes   Time? Yes  Recall of three objects?  Yes  Can perform simple calculations? Yes  Displays appropriate judgment?Yes  Can read  the correct time from a watch face?Yes  EOL planning: Does Patient Have a Medical Advance Directive?: Yes Type of Advance Directive: Healthcare Power of Attorney, Living will Does patient want to make changes to medical advance directive?: No - Patient declined Copy of Healthcare Power of Attorney in Chart?: No - copy requested   Objective:   Today's Vitals   09/27/22 1417  BP: 110/70  Pulse: 77  Temp: 97.7 F (36.5 C)  SpO2: 97%  Weight: 218 lb 3.2 oz (99 kg)  Height: 5'  9" (1.753 m)     Body mass index is 32.22 kg/m.  General appearance: alert, no distress, WD/WN, male HEENT: normocephalic, sclerae anicteric, TMs pearly, nares patent, no discharge or erythema, pharynx normal Oral cavity: MMM, no lesions Neck: supple, no lymphadenopathy, no thyromegaly, no masses Heart: RRR, normal S1, S2, no murmurs Lungs: Breath sounds diminished with crackles in left lower lobe Abdomen: +bs, soft, non tender, non distended, no masses, no hepatomegaly, no splenomegaly Musculoskeletal: nontender, no swelling, no obvious deformity Extremities: no edema, no cyanosis, no clubbing Pulses: 2+ symmetric, upper and lower extremities, normal cap refill Neurological: alert, oriented x 3, CN2-12 intact, strength normal upper extremities and lower extremities, sensation normal throughout, DTRs 2+ throughout, no cerebellar signs, gait normal Psychiatric: normal affect, behavior normal, pleasant   Medicare Attestation I have personally reviewed: The patient's medical and social history Their use of alcohol, tobacco or illicit drugs Their current medications and supplements The patient's functional ability including ADLs,fall risks, home safety risks, cognitive, and hearing and visual impairment Diet and physical activities Evidence for depression or mood disorders  The patient's weight, height, BMI, and visual acuity have been recorded in the chart.  I have made referrals, counseling, and provided  education to the patient based on review of the above and I have provided the patient with a written personalized care plan for preventive services.     Raynelle Dick, NP   09/27/2022

## 2022-09-27 ENCOUNTER — Ambulatory Visit (INDEPENDENT_AMBULATORY_CARE_PROVIDER_SITE_OTHER): Payer: Medicare Other | Admitting: Nurse Practitioner

## 2022-09-27 ENCOUNTER — Encounter: Payer: Self-pay | Admitting: Nurse Practitioner

## 2022-09-27 VITALS — BP 110/70 | HR 77 | Temp 97.7°F | Ht 69.0 in | Wt 218.2 lb

## 2022-09-27 DIAGNOSIS — Z79899 Other long term (current) drug therapy: Secondary | ICD-10-CM | POA: Diagnosis not present

## 2022-09-27 DIAGNOSIS — J701 Chronic and other pulmonary manifestations due to radiation: Secondary | ICD-10-CM | POA: Diagnosis not present

## 2022-09-27 DIAGNOSIS — E782 Mixed hyperlipidemia: Secondary | ICD-10-CM | POA: Diagnosis not present

## 2022-09-27 DIAGNOSIS — D692 Other nonthrombocytopenic purpura: Secondary | ICD-10-CM

## 2022-09-27 DIAGNOSIS — Z0001 Encounter for general adult medical examination with abnormal findings: Secondary | ICD-10-CM

## 2022-09-27 DIAGNOSIS — J069 Acute upper respiratory infection, unspecified: Secondary | ICD-10-CM

## 2022-09-27 DIAGNOSIS — I63032 Cerebral infarction due to thrombosis of left carotid artery: Secondary | ICD-10-CM | POA: Diagnosis not present

## 2022-09-27 DIAGNOSIS — I2510 Atherosclerotic heart disease of native coronary artery without angina pectoris: Secondary | ICD-10-CM | POA: Diagnosis not present

## 2022-09-27 DIAGNOSIS — E559 Vitamin D deficiency, unspecified: Secondary | ICD-10-CM | POA: Diagnosis not present

## 2022-09-27 DIAGNOSIS — E039 Hypothyroidism, unspecified: Secondary | ICD-10-CM

## 2022-09-27 DIAGNOSIS — N400 Enlarged prostate without lower urinary tract symptoms: Secondary | ICD-10-CM

## 2022-09-27 DIAGNOSIS — Z0000 Encounter for general adult medical examination without abnormal findings: Secondary | ICD-10-CM

## 2022-09-27 DIAGNOSIS — R6889 Other general symptoms and signs: Secondary | ICD-10-CM | POA: Diagnosis not present

## 2022-09-27 DIAGNOSIS — R0989 Other specified symptoms and signs involving the circulatory and respiratory systems: Secondary | ICD-10-CM | POA: Diagnosis not present

## 2022-09-27 DIAGNOSIS — R7309 Other abnormal glucose: Secondary | ICD-10-CM

## 2022-09-27 DIAGNOSIS — J449 Chronic obstructive pulmonary disease, unspecified: Secondary | ICD-10-CM

## 2022-09-27 DIAGNOSIS — Z85118 Personal history of other malignant neoplasm of bronchus and lung: Secondary | ICD-10-CM | POA: Diagnosis not present

## 2022-09-27 MED ORDER — AZITHROMYCIN 250 MG PO TABS: ORAL_TABLET | ORAL | 1 refills | Status: DC

## 2022-09-27 MED ORDER — LEVOTHYROXINE SODIUM 125 MCG PO TABS: ORAL_TABLET | ORAL | 3 refills | Status: DC

## 2022-09-27 NOTE — Patient Instructions (Signed)

## 2022-09-28 LAB — COMPLETE METABOLIC PANEL WITH GFR
AG Ratio: 1.1 (calc) (ref 1.0–2.5)
ALT: 21 U/L (ref 9–46)
AST: 26 U/L (ref 10–35)
Albumin: 3.4 g/dL — ABNORMAL LOW (ref 3.6–5.1)
Alkaline phosphatase (APISO): 85 U/L (ref 35–144)
BUN: 11 mg/dL (ref 7–25)
CO2: 26 mmol/L (ref 20–32)
Calcium: 8.8 mg/dL (ref 8.6–10.3)
Chloride: 104 mmol/L (ref 98–110)
Creat: 1.12 mg/dL (ref 0.70–1.28)
Globulin: 3 g/dL (calc) (ref 1.9–3.7)
Glucose, Bld: 90 mg/dL (ref 65–99)
Potassium: 4.5 mmol/L (ref 3.5–5.3)
Sodium: 139 mmol/L (ref 135–146)
Total Bilirubin: 0.3 mg/dL (ref 0.2–1.2)
Total Protein: 6.4 g/dL (ref 6.1–8.1)
eGFR: 69 mL/min/{1.73_m2} (ref >=60)

## 2022-09-28 LAB — CBC WITH DIFFERENTIAL/PLATELET
Absolute Monocytes: 763 cells/uL (ref 200–950)
Basophils Absolute: 62 cells/uL (ref 0–200)
Basophils Relative: 1 %
Eosinophils Absolute: 298 cells/uL (ref 15–500)
Eosinophils Relative: 4.8 %
HCT: 41.9 % (ref 38.5–50.0)
Hemoglobin: 14.3 g/dL (ref 13.2–17.1)
Lymphs Abs: 1333 cells/uL (ref 850–3900)
MCH: 32.9 pg (ref 27.0–33.0)
MCHC: 34.1 g/dL (ref 32.0–36.0)
MCV: 96.3 fL (ref 80.0–100.0)
MPV: 9.6 fL (ref 7.5–12.5)
Monocytes Relative: 12.3 %
Neutro Abs: 3745 cells/uL (ref 1500–7800)
Neutrophils Relative %: 60.4 %
Platelets: 360 10*3/uL (ref 140–400)
RBC: 4.35 10*6/uL (ref 4.20–5.80)
RDW: 11.9 % (ref 11.0–15.0)
Total Lymphocyte: 21.5 %
WBC: 6.2 10*3/uL (ref 3.8–10.8)

## 2022-09-28 LAB — LIPID PANEL
Cholesterol: 163 mg/dL (ref <200)
HDL: 49 mg/dL (ref >=40)
LDL Cholesterol (Calc): 87 mg/dL (calc)
Non-HDL Cholesterol (Calc): 114 mg/dL (calc) (ref <130)
Total CHOL/HDL Ratio: 3.3 (calc) (ref <5.0)
Triglycerides: 173 mg/dL — ABNORMAL HIGH (ref <150)

## 2022-09-28 LAB — TSH: TSH: 1.67 mIU/L (ref 0.40–4.50)

## 2022-12-14 ENCOUNTER — Ambulatory Visit: Payer: Medicare Other | Admitting: Nurse Practitioner

## 2023-01-01 ENCOUNTER — Encounter: Payer: Self-pay | Admitting: Internal Medicine

## 2023-01-01 NOTE — Patient Instructions (Signed)

## 2023-01-01 NOTE — Progress Notes (Unsigned)
Future Appointments  Date Time Provider Department  01/02/2023                   6 mo ov   2:30 PM Lucky Cowboy, MD [facility]  07/03/2023                  cpe                       2:00 PM Lucky Cowboy, MD [facility]  Had wellness Jan 23,2024 Christopher Peck    History of Present Illness:       This very nice 77 y.o.male presents for 3 month follow up with HTN, HLD,  COPD, Pre-Diabetes and Vitamin D Deficiency.  CT scan in 2-010 showed Aortic Atherosclerosis.  In 2006, patient underwent curative Chemoradiation of a small cell LUL lung ca with  complications of radiation fibrosis. Previous CXR's have shown COPD. He has refused any CXR or chest CT's since 2017.         Patient is treated for HTN  since   2012   & BP has been controlled at home. Today's  .    Patient also has hx/o parox. Afib (CHADs2VASC = 2) since age 64 yo & has refused any meds.  Patient has had no complaints of any cardiac type chest pain, palpitations, dyspnea / orthopnea / PND, dizziness, claudication, or dependent edema.        Hyperlipidemia is controlled with diet & meds. Patient denies myalgias or other med SE's. Last Lipids were  Lab Results  Component Value Date   CHOL 163 09/27/2022   HDL 49 09/27/2022   LDLCALC 87 09/27/2022   TRIG 173 (H) 09/27/2022   CHOLHDL 3.3 09/27/2022                 Patient was dx'd Hypothyroid in 2013 and does take replacement meds.    Also, the patient has history of PreDiabetes and has had no symptoms of reactive hypoglycemia, diabetic polys, paresthesias or visual blurring.  Last A1c was   Lab Results  Component Value Date   HGBA1C 5.5 06/27/2022        Further, the patient also has history of Vitamin D Deficiency and supplements vitamin D . Last vitamin D was  Lab Results  Component Value Date   VD25OH 33 06/27/2022     Current Outpatient Medications on File Prior to Visit  Medication Sig   albuterol (ACCUNEB) 1.25 MG/3ML nebulizer solution USE 1 VIAL VIA  NEBULIZER EVERY 6 HOURS AS NEEDED FOR WHEEZING   albuterol (VENTOLIN HFA) 108 (90 Base) MCG/ACT inhaler Use  2 inhalations  15 minutes apart  every 4 hours  as needed to Rescue Asthma Attack   citalopram (CELEXA) 20 MG tablet Take  1 tablet  Daily    finasteride (PROSCAR) 5 MG tablet Take 1 tablet   daily.   levothyroxine 125 MCG tablet Take  1 tablet M-W-F and 1/2 tab the other 4 days       Allergies  Allergen Reactions   Breztri Aerosphere [Budeson-Glycopyrrol-Formoterol]     Bad weezing    Spiractone [Spironolactone]     Facial nerve pain     PMHx:   Past Medical History:  Diagnosis Date   Emphysema of lung (HCC)    History of bilateral inguinal hernia repair 05/11/2009   Hypertension      Immunization History  Administered Date(s) Administered   Influenza, High Dose  05/23/2019, 06/22/2020, 06/27/2022   Influenza 06/24/2015, 06/07/2017, 06/24/2021   PNEUMOCOCCAL CONJUGATE-20 06/27/2022   Pneumococcal -13 06/24/2015   Pneumococcal -23 01/03/2012, 08/23/2017   Td 01/03/2012     Past Surgical History:  Procedure Laterality Date   AORTIC ARCH ANGIOGRAPHY N/A 12/09/2020   Procedure: AORTIC ARCH ANGIOGRAPHY;  Surgeon: Leonie Douglas, MD;  Location: [facility];  Service: Cardiovascular;  Laterality: N/A;   CATARACT EXTRACTION, BILATERAL     08/21, 09/21   HERNIA REPAIR     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   Procedure: PERIPHERAL VASCULAR INTERVENTION;  Surgeon: Leonie Douglas, MD;  Location: [facility];  Service: Cardiovascular;  Laterality: Left;  LCCA     FHx:    Reviewed / unchanged   SHx:    Reviewed / unchanged    Systems Review:  Constitutional: Denies fever, chills, wt changes, headaches, insomnia, fatigue, night sweats, change in appetite. Eyes: Denies redness, blurred vision, diplopia, discharge, itchy, watery eyes.  ENT: Denies discharge, congestion, post nasal drip, epistaxis, sore throat, earache, hearing loss, dental  pain, tinnitus, vertigo, sinus pain, snoring.  CV: Denies chest pain, palpitations, irregular heartbeat, syncope, dyspnea, diaphoresis, orthopnea, PND, claudication or edema. Respiratory: denies cough, dyspnea, DOE, pleurisy, hoarseness, laryngitis, wheezing.  Gastrointestinal: Denies dysphagia, odynophagia, heartburn, reflux, water brash, abdominal pain or cramps, nausea, vomiting, bloating, diarrhea, constipation, hematemesis, melena, hematochezia  or hemorrhoids. Genitourinary: Denies dysuria, frequency, urgency, nocturia, hesitancy, discharge, hematuria or flank pain. Musculoskeletal: Denies arthralgias, myalgias, stiffness, jt. swelling, pain, limping or strain/sprain.  Skin: Denies pruritus, rash, hives, warts, acne, eczema or change in skin lesion(s). Neuro: No weakness, tremor, incoordination, spasms, paresthesia or pain. Psychiatric: Denies confusion, memory loss or sensory loss. Endo: Denies change in weight, skin or hair change.  Heme/Lymph: No excessive bleeding, bruising or enlarged lymph nodes.   Physical Exam  There were no vitals taken for this visit.  Appears  well nourished, well groomed  and in no distress.  Eyes: PERRLA, EOMs, conjunctiva no swelling or erythema. Sinuses: No frontal/maxillary tenderness ENT/Mouth: EAC's clear, TM's nl w/o erythema, bulging. Nares clear w/o erythema, swelling, exudates. Oropharynx clear without erythema or exudates. Oral hygiene is good. Tongue normal, non obstructing. Hearing intact.  Neck: Supple. Thyroid not palpable. Car 2+/2+ without bruits, nodes or JVD. Chest: Respirations nl with BS clear & equal w/o rales, rhonchi, wheezing or stridor.  Cor: Heart sounds normal w/ regular rate and rhythm without sig. murmurs, gallops, clicks or rubs. Peripheral pulses normal and equal  without edema.  Abdomen: Soft & bowel sounds normal. Non-tender w/o guarding, rebound, hernias, masses or organomegaly.  Lymphatics: Unremarkable.   Musculoskeletal: Full ROM all peripheral extremities, joint stability, 5/5 strength and normal gait.  Skin: Warm, dry without exposed rashes, lesions or ecchymosis apparent.  Neuro: Cranial nerves intact, reflexes equal bilaterally. Sensory-motor testing grossly intact. Tendon reflexes grossly intact.  Pysch: Alert & oriented x 3.  Insight and judgement nl & appropriate. No ideations.   Assessment and Plan:   1. Labile hypertension  - Continue medication, monitor blood pressure at home.  - Continue DASH diet.  Reminder to go to the ER if any CP,  SOB, nausea, dizziness, severe HA, changes vision/speech.    - CBC with Differential/Platelet - COMPLETE METABOLIC PANEL WITH GFR - Magnesium - TSH  2. Hyperlipidemia, mixed  - Continue diet/meds, exercise,& lifestyle modifications.  - Continue monitor periodic cholesterol/liver & renal functions    - Lipid panel - TSH  3.  Abnormal glucose  - Continue diet, exercise  - Lifestyle modifications.  - Monitor appropriate labs   - Hemoglobin A1c - Insulin, random  4. Vitamin D deficiency  - Continue supplementation   - VITAMIN D 25 Hydroxy  5. Hypothyroidism  - TSH  6. Medication management  - CBC with Differential/Platelet - COMPLETE METABOLIC PANEL WITH GFR - Magnesium - Lipid panel - TSH - Hemoglobin A1c - Insulin, random - VITAMIN D 25 Hydroxy           Discussed  regular exercise, BP monitoring, weight control to achieve/maintain BMI less than 25 and discussed med and SE's. Recommended labs to assess /monitor clinical status .  I discussed the assessment and treatment plan with the patient. The patient was provided an opportunity to ask questions and all were answered. The patient agreed with the plan and demonstrated an understanding of the instructions.  I provided over 30 minutes of exam, counseling, chart review and  complex critical decision making.        The patient was advised to call back or seek an  in-person evaluation if the symptoms worsen or if the condition fails to improve as anticipated.   Marinus Maw, MD

## 2023-01-02 ENCOUNTER — Encounter: Payer: Self-pay | Admitting: Internal Medicine

## 2023-01-02 ENCOUNTER — Ambulatory Visit (INDEPENDENT_AMBULATORY_CARE_PROVIDER_SITE_OTHER): Payer: Medicare Other | Admitting: Internal Medicine

## 2023-01-02 VITALS — BP 132/76 | HR 76 | Temp 97.9°F | Resp 16 | Ht 69.0 in | Wt 218.2 lb

## 2023-01-02 DIAGNOSIS — R0989 Other specified symptoms and signs involving the circulatory and respiratory systems: Secondary | ICD-10-CM

## 2023-01-02 DIAGNOSIS — E559 Vitamin D deficiency, unspecified: Secondary | ICD-10-CM | POA: Diagnosis not present

## 2023-01-02 DIAGNOSIS — Z79899 Other long term (current) drug therapy: Secondary | ICD-10-CM

## 2023-01-02 DIAGNOSIS — E039 Hypothyroidism, unspecified: Secondary | ICD-10-CM

## 2023-01-02 DIAGNOSIS — R7309 Other abnormal glucose: Secondary | ICD-10-CM

## 2023-01-02 DIAGNOSIS — E782 Mixed hyperlipidemia: Secondary | ICD-10-CM

## 2023-01-03 LAB — COMPLETE METABOLIC PANEL WITH GFR
AG Ratio: 1.3 (calc) (ref 1.0–2.5)
ALT: 15 U/L (ref 9–46)
AST: 27 U/L (ref 10–35)
Albumin: 3.8 g/dL (ref 3.6–5.1)
Alkaline phosphatase (APISO): 91 U/L (ref 35–144)
BUN: 18 mg/dL (ref 7–25)
CO2: 28 mmol/L (ref 20–32)
Calcium: 9 mg/dL (ref 8.6–10.3)
Chloride: 105 mmol/L (ref 98–110)
Creat: 1.16 mg/dL (ref 0.70–1.28)
Globulin: 2.9 g/dL (calc) (ref 1.9–3.7)
Glucose, Bld: 86 mg/dL (ref 65–99)
Potassium: 4.1 mmol/L (ref 3.5–5.3)
Sodium: 142 mmol/L (ref 135–146)
Total Bilirubin: 0.4 mg/dL (ref 0.2–1.2)
Total Protein: 6.7 g/dL (ref 6.1–8.1)
eGFR: 66 mL/min/{1.73_m2} (ref >=60)

## 2023-01-03 LAB — LIPID PANEL
Cholesterol: 159 mg/dL (ref <200)
HDL: 59 mg/dL (ref >=40)
LDL Cholesterol (Calc): 77 mg/dL (calc)
Non-HDL Cholesterol (Calc): 100 mg/dL (calc) (ref <130)
Total CHOL/HDL Ratio: 2.7 (calc) (ref <5.0)
Triglycerides: 124 mg/dL (ref <150)

## 2023-01-03 LAB — CBC WITH DIFFERENTIAL/PLATELET
Absolute Monocytes: 998 cells/uL — ABNORMAL HIGH (ref 200–950)
Basophils Absolute: 103 cells/uL (ref 0–200)
Basophils Relative: 1.2 %
Eosinophils Absolute: 138 cells/uL (ref 15–500)
Eosinophils Relative: 1.6 %
HCT: 42.5 % (ref 38.5–50.0)
Hemoglobin: 14.2 g/dL (ref 13.2–17.1)
Lymphs Abs: 1720 cells/uL (ref 850–3900)
MCH: 31.8 pg (ref 27.0–33.0)
MCHC: 33.4 g/dL (ref 32.0–36.0)
MCV: 95.3 fL (ref 80.0–100.0)
MPV: 9.5 fL (ref 7.5–12.5)
Monocytes Relative: 11.6 %
Neutro Abs: 5642 cells/uL (ref 1500–7800)
Neutrophils Relative %: 65.6 %
Platelets: 236 10*3/uL (ref 140–400)
RBC: 4.46 10*6/uL (ref 4.20–5.80)
RDW: 11.9 % (ref 11.0–15.0)
Total Lymphocyte: 20 %
WBC: 8.6 10*3/uL (ref 3.8–10.8)

## 2023-01-03 LAB — HEMOGLOBIN A1C
Hgb A1c MFr Bld: 5.6 % of total Hgb (ref <5.7)
Mean Plasma Glucose: 114 mg/dL
eAG (mmol/L): 6.3 mmol/L

## 2023-01-03 LAB — TSH: TSH: 0.48 mIU/L (ref 0.40–4.50)

## 2023-01-03 LAB — VITAMIN D 25 HYDROXY (VIT D DEFICIENCY, FRACTURES): Vit D, 25-Hydroxy: 37 ng/mL (ref 30–100)

## 2023-01-03 LAB — MAGNESIUM: Magnesium: 2 mg/dL (ref 1.5–2.5)

## 2023-01-03 LAB — INSULIN, RANDOM: Insulin: 11 u[IU]/mL

## 2023-01-03 NOTE — Progress Notes (Signed)
^<^<^<^<^<^<^<^<^<^<^<^<^<^<^<^<^<^<^<^<^<^<^<^<^<^<^<^<^<^<^<^<^<^<^<^<^ ^>^>^>^>^>^>^>^>^>^>^>>^>^>^>^>^>^>^>^>^>^>^>^>^>^>^>^>^>^>^>^>^>^>^>^>^ -  Test results slightly outside the reference range are not unusual. If there is anything important, I will review this with you,  otherwise it is considered normal test values.  If you have further questions,  please do not hesitate to contact me at the office or via My Chart.  ^<^<^<^<^<^<^<^<^<^<^<^<^<^<^<^<^<^<^<^<^<^<^<^<^<^<^<^<^<^<^<^<^<^<^<^<^ ^>^>^>^>^>^>^>^>^>^>^>^>^>^>^>^>^>^>^>^>^>^>^>^>^>^>^>^>^>^>^>^>^>^>^>^>^  -  Chol = 159  -  Excellent   - Very low risk for Heart Attack  / Stroke ^<^<^<^<^<^<^<^<^<^<^<^<^<^<^<^<^<^<^<^<^<^<^<^<^<^<^<^<^<^<^<^<^<^<^<^<^ ^>^>^>^>^>^>^>^>^>^>^>^>^>^>^>^>^>^>^>^>^>^>^>^>^>^>^>^>^>^>^>^>^>^>^>^>^  - A1c - Normal - No Diabetes  - Great !  ^<^<^<^<^<^<^<^<^<^<^<^<^<^<^<^<^<^<^<^<^<^<^<^<^<^<^<^<^<^<^<^<^<^<^<^<^ ^>^>^>^>^>^>^>^>^>^>^>^>^>^>^>^>^>^>^>^>^>^>^>^>^>^>^>^>^>^>^>^>^>^>^>^>^  - Vit D = 37 - Very Low   - Vitamin D goal is between 70-100.   - Please take  Vitamin D 5,000 units capsule Daily   - It is very important as a natural anti-inflammatory and helping the  immune system protect against viral infections, like the Covid-19    helping hair, skin, and nails, as well as reducing stroke and  heart attack risk.   - It helps your bones and helps with mood.  - It also decreases numerous cancer risks so please  take it as directed.   - Low Vit D is associated with a 200-300% higher risk for  CANCER   and 200-300% higher risk for HEART   ATTACK  &  STROKE.    - It is also associated with higher death rate at younger ages,   autoimmune diseases like Rheumatoid arthritis, Lupus,  Multiple Sclerosis.     - Also many other serious conditions, like depression, Alzheimer's  Dementia, infertility, muscle aches, fatigue, fibromyalgia   ^<^<^<^<^<^<^<^<^<^<^<^<^<^<^<^<^<^<^<^<^<^<^<^<^<^<^<^<^<^<^<^<^<^<^<^<^ ^>^>^>^>^>^>^>^>^>^>^>^>^>^>^>^>^>^>^>^>^>^>^>^>^>^>^>^>^>^>^>^>^>^>^>^>^  - All Else - CBC - Kidneys - Electrolytes - Liver - Magnesium & Thyroid    - all  Normal / OK  ^<^<^<^<^<^<^<^<^<^<^<^<^<^<^<^<^<^<^<^<^<^<^<^<^<^<^<^<^<^<^<^<^<^<^<^<^ ^>^>^>^>^>^>^>^>^>^>^>^>^>^>^>^>^>^>^>^>^>^>^>^>^>^>^>^>^>^>^>^>^>^>^>^>^

## 2023-02-07 ENCOUNTER — Other Ambulatory Visit: Payer: Self-pay | Admitting: *Deleted

## 2023-02-07 DIAGNOSIS — Z95828 Presence of other vascular implants and grafts: Secondary | ICD-10-CM

## 2023-02-13 NOTE — Progress Notes (Unsigned)
[facility] [place]  ASSESSMENT / PLAN: 77 y.o. male with proximal, severe left common carotid artery stenosis status post balloon expandable covered stenting 12/09/20. He tolerated this well.  His symptoms of disequilibrium have improved.  Continue aspirin and statin therapy.  CHIEF COMPLAINT: stroke, syncope. Proximal left carotid stenosis on recent CT.  HISTORY OF PRESENT ILLNESS: Christopher Peck is a 77 y.o. male referred to clinic for evaluation of proximal carotid artery stenosis.  Patient has had difficulty with orthostatic hypotension for some time.  Recently he suffered a syncopal event with some seizure-like activity.  Did presentation to the ER, and a referral to Dr. Terrace Arabia of neurology.  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  He has a history of radiation therapy to the chest for lung cancer 16 years ago.   01/12/2021: Returns after left common carotid artery stenting 12/09/2020.  He tolerated this very well.  He reports his dizzy spells have improved significantly.  He has had no focal symptoms.  02/01/22: Doing well. No new focal / neurologic symptoms. Wants to stop plavix.   Past Medical History:  Diagnosis Date   Emphysema of lung (HCC)    History of bilateral inguinal hernia repair 05/11/2009   Hypertension     Past Surgical History:  Procedure Laterality Date   AORTIC ARCH ANGIOGRAPHY N/A 12/09/2020   Procedure: AORTIC ARCH ANGIOGRAPHY;  Surgeon: Leonie Douglas, MD;  Location: [facility];  Service: Cardiovascular;  Laterality: N/A;   CATARACT EXTRACTION, BILATERAL     08/21, 09/21   HERNIA REPAIR     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   Procedure: PERIPHERAL VASCULAR INTERVENTION;  Surgeon: Leonie Douglas, MD;  Location: [facility];  Service: Cardiovascular;  Laterality: Left;  LCCA    Family History  Problem Relation Age of Onset   COPD Father     Kidney failure Father     Social History   Socioeconomic History   Marital status: Married    Spouse name: Not on file   Number of children: Not on file   Years of education: Not on file   Highest education level: Not on file  Occupational History   Not on file  Tobacco Use   Smoking status: Former    Types: Cigarettes    Quit date: 09/05/2004    Years since quitting: 18.4   Smokeless tobacco: Never  Vaping Use   Vaping Use: Never used  Substance and Sexual Activity   Alcohol use: Not Currently    Alcohol/week: 3.0 standard drinks of alcohol    Types: 3 Standard drinks or equivalent per week    Comment: none   Drug use: Never   Sexual activity: Not on file  Other Topics Concern   Not on file  Social History Narrative   Right handed   Caffeine use:  1 cup coffee and tea per day   Lives with spouse, Christopher Peck.   Social Determinants of Health   Financial Resource Strain: Not on file  Food Insecurity: Not on file  Transportation Needs: Not on file  Physical Activity: Not on file  Stress: Not on file  Social Connections: Not on file  Intimate Partner Violence: Not on file    Allergies  Allergen Reactions   Breztri Aerosphere [Budeson-Glycopyrrol-Formoterol]     Bad weezing    Spiractone [Spironolactone]     Facial nerve pain    Current Outpatient  Medications  Medication Sig Dispense Refill   albuterol (ACCUNEB) 1.25 MG/3ML nebulizer solution USE 1 VIAL VIA NEBULIZER EVERY 6 HOURS AS NEEDED FOR WHEEZING 75 mL 20   albuterol (VENTOLIN HFA) 108 (90 Base) MCG/ACT inhaler Use  2 inhalations  15 minutes apart  every 4 hours  as needed to Rescue Asthma Attack 48 g 3   citalopram (CELEXA) 20 MG tablet Take  1 tablet  Daily  for Mood                                     /                               TAKE                  BY                   MOUTH 90 tablet 3   finasteride (PROSCAR) 5 MG tablet Take 1 tablet (5 mg total) by mouth daily. 90 tablet 1   levothyroxine (SYNTHROID)  125 MCG tablet Take  1 tablet M-W-F and 1/2 tab the other 4 days  on an empty stomach with only water for 30 minutes & no Antacid meds, Calcium or Magnesium for 4 hours & avoid Biotin 90 tablet 3   No current facility-administered medications for this visit.    PHYSICAL EXAM  There were no vitals filed for this visit.    Constitutional: well appearing. no distress. Appears well nourished.  Neurologic: CN intact. no focal findings. no sensory loss. Psychiatric: Mood and affect symmetric and appropriate. Eyes: No icterus. No conjunctival pallor. Ears, nose, throat: mucous membranes moist. Midline trachea.  Cardiac: regular rate and rhythm.  Respiratory: unlabored. Abdominal: soft, non-tender, non-distended.  Peripheral vascular:  2+ femoral pulses Extremity: No edema. No cyanosis. No pallor.  Skin: No gangrene. No ulceration.  Lymphatic: No Stemmer's sign. No palpable lymphadenopathy.  PERTINENT LABORATORY AND RADIOLOGIC DATA  Most recent CBC    Latest Ref Rng & Units 01/02/2023    2:05 PM 09/27/2022    2:52 PM 06/27/2022    3:16 PM  CBC  WBC 3.8 - 10.8 Thousand/uL 8.6  6.2  7.6   Hemoglobin 13.2 - 17.1 g/dL 16.1  09.6  04.5   Hematocrit 38.5 - 50.0 % 42.5  41.9  46.7   Platelets 140 - 400 Thousand/uL 236  360  252      Most recent CMP    Latest Ref Rng & Units 01/02/2023    2:05 PM 09/27/2022    2:52 PM 06/27/2022    3:16 PM  CMP  Glucose 65 - 99 mg/dL 86  90  88   BUN 7 - 25 mg/dL 18  11  16    Creatinine 0.70 - 1.28 mg/dL 4.09  8.11  9.14   Sodium 135 - 146 mmol/L 142  139  141   Potassium 3.5 - 5.3 mmol/L 4.1  4.5  4.4   Chloride 98 - 110 mmol/L 105  104  105   CO2 20 - 32 mmol/L 28  26  27    Calcium 8.6 - 10.3 mg/dL 9.0  8.8  9.3   Total Protein 6.1 - 8.1 g/dL 6.7  6.4  6.7   Total Bilirubin 0.2 - 1.2 mg/dL 0.4  0.3  0.4  AST 10 - 35 U/L 27  26  20    ALT 9 - 46 U/L 15  21  13      Renal function CrCl cannot be calculated (Patient's most recent lab result is  older than the maximum 21 days allowed.).  Hgb A1c MFr Bld (% of total Hgb)  Date Value  01/02/2023 5.6    LDL Cholesterol (Calc)  Date Value Ref Range Status  01/02/2023 77 mg/dL (calc) Final    Comment:    Reference range: <100 . Desirable range <100 mg/dL for primary prevention;   <70 mg/dL for patients with CHD or diabetic patients  with > or = 2 CHD risk factors. Marland Kitchen LDL-C is now calculated using the Martin-Hopkins  calculation, which is a validated novel method providing  better accuracy than the Friedewald equation in the  estimation of LDL-C.  Horald Pollen et al. Lenox Ahr. 1610;960(45): 2061-2068  (http://education.[employer].com/faq/FAQ164)     CT angiogram personally reviewed Continued primary patency of stent without stenosis.  Rande Brunt. Lenell Antu, MD [facility] Phone Number: [phone] 02/13/2023 12:05 PM

## 2023-02-14 ENCOUNTER — Encounter: Payer: Self-pay | Admitting: Vascular Surgery

## 2023-02-14 ENCOUNTER — Ambulatory Visit (HOSPITAL_COMMUNITY)
Admission: RE | Admit: 2023-02-14 | Discharge: 2023-02-14 | Disposition: A | Discharge status: Home / Self Care | Payer: Medicare Other | Source: Ambulatory Visit | Attending: Vascular Surgery | Admitting: Vascular Surgery

## 2023-02-14 ENCOUNTER — Ambulatory Visit: Payer: Medicare Other | Admitting: Vascular Surgery

## 2023-02-14 VITALS — BP 148/91 | HR 63 | Temp 98.6°F | Resp 20 | Ht 69.0 in | Wt 215.8 lb

## 2023-02-14 DIAGNOSIS — Z95828 Presence of other vascular implants and grafts: Secondary | ICD-10-CM | POA: Diagnosis not present

## 2023-02-14 DIAGNOSIS — Z9889 Other specified postprocedural states: Secondary | ICD-10-CM | POA: Diagnosis not present

## 2023-03-01 ENCOUNTER — Other Ambulatory Visit: Payer: Self-pay | Admitting: Internal Medicine

## 2023-03-01 ENCOUNTER — Other Ambulatory Visit: Payer: Self-pay | Admitting: Nurse Practitioner

## 2023-03-02 ENCOUNTER — Other Ambulatory Visit: Payer: Self-pay | Admitting: Internal Medicine

## 2023-03-13 DIAGNOSIS — Z961 Presence of intraocular lens: Secondary | ICD-10-CM | POA: Diagnosis not present

## 2023-03-13 DIAGNOSIS — H43813 Vitreous degeneration, bilateral: Secondary | ICD-10-CM | POA: Diagnosis not present

## 2023-03-13 DIAGNOSIS — H04123 Dry eye syndrome of bilateral lacrimal glands: Secondary | ICD-10-CM | POA: Diagnosis not present

## 2023-03-13 DIAGNOSIS — H524 Presbyopia: Secondary | ICD-10-CM | POA: Diagnosis not present

## 2023-03-13 DIAGNOSIS — H52203 Unspecified astigmatism, bilateral: Secondary | ICD-10-CM | POA: Diagnosis not present

## 2023-04-17 ENCOUNTER — Other Ambulatory Visit: Payer: Self-pay | Admitting: Internal Medicine

## 2023-04-17 DIAGNOSIS — J4520 Mild intermittent asthma, uncomplicated: Secondary | ICD-10-CM

## 2023-07-02 ENCOUNTER — Encounter: Payer: Self-pay | Admitting: Internal Medicine

## 2023-07-02 NOTE — Progress Notes (Signed)
Annual  Screening/Preventative Visit  & Comprehensive Evaluation & Examination   Future Appointments  Date Time Provider Department  07/03/2023  2:00 PM Lucky Cowboy, MD [facility]  07/09/2024  2:00 PM Lucky Cowboy, MD [facility]            This very nice 77 y.o. MWM with HTN, HLD, Prediabetes and Vitamin D Deficiency presents for a Screening /Preventative Visit & comprehensive evaluation and management of multiple medical co-morbidities.  CT scan in 2010 showed Coronary Aa Atherosclerosis.              In 2022, patient had a seizure  & was started on Keppra  by Dr Terrace Arabia & patient later stopped the Keppra in 2022 & has had no more seizures  since then .   Patient was also found to have an occluded Lt Common carotid Aa which he had stented in April by Dr Heath Lark. Patient also is known to have a Left  Brain Parietal lobe lesion unchanged since 2006 & followed by Dr Terrace Arabia.         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 Previous CXR's have shown COPD. He has refused any CXR or chest CT's since 2017.        Patient has hx/o labile HTN predating from 2012.  Patient's BP has been controlled at home.  Today's BP is  at goal -138/70.   Patient also has hx/o parox. Afib (CHADs2VASC = 2) since age 89 yo & has refused any meds.  Patient denies any cardiac symptoms as chest pain, palpitations, shortness of breath, dizziness or ankle swelling.       Patient's hyperlipidemia is not controlled with diet and patient has been reticient to take meds for Cholesterol.  Last lipids were not at goal :  Lab Results  Component Value Date   CHOL 159 01/02/2023   HDL 59 01/02/2023   LDLCALC 77 01/02/2023   TRIG 124 01/02/2023   CHOLHDL 2.7 01/02/2023         Patient was dx'd Hypothyroid in 2013 and does take replacement meds.         Patient has been monitored expectantly for glucose intolerance and  patient denies reactive hypoglycemic symptoms, visual blurring, diabetic polys or paresthesias. Last A1c was normal & at goal :   Lab Results  Component Value Date   HGBA1C 5.6 01/02/2023         Finally, patient has history of Vitamin D Deficiency  ("34" /2008 &"23" /2016) and last vitamin D was still very low (goal 70-100) and patient refuses to take recommended Vit D supplements:   Lab Results  Component Value Date   VD25OH 37 01/02/2023       Current Outpatient Medications  Medication Instructions   albuterol 1.25 MG/3ML neb  soln 1 VIAL VIA NEB  EVERY 6 HRS AS NEEDED    albuterol  HFA inhaler 2 INHALATIONS EVERY 4 HRS AS NEEDED    citalopram  20 MG tablet TAKE 1 TABLET  EVERY DAY    Finasteride   5 mg  Daily   levothyroxine  125 MCG tablet TAKE 1 TABLET DAILY      Allergies  Allergen Reactions   Spiractone [Spironolactone]     Facial nerve pain     Past Medical History:  Diagnosis Date   Emphysema of lung (HCC)    History of bilateral inguinal hernia repair  05/11/2009   Hypertension      Health Maintenance  Topic Date Due   COVID-19 Vaccine (1) Never done   Zoster Vaccines- Shingrix (1 of 2) Never done   INFLUENZA VACCINE  04/05/2021   TETANUS/TDAP  01/02/2022   Pneumonia Vaccine 66+ Years old  Completed   HPV VACCINES  Aged Out    Immunization History  Administered Date(s) Administered   Influenza, High Dose  05/23/2019, 06/22/2020   Influenza 06/24/2015, 06/07/2017   Pneumococcal -13 06/24/2015   Pneumococcal -23 01/03/2012, 08/23/2017   Td 01/03/2012    Last Colon - always refuses   Past Surgical History:  Procedure Laterality Date   AORTIC ARCH ANGIOGRAPHY N/A 12/09/2020   Procedure: AORTIC ARCH ANGIOGRAPHY;  Surgeon: Leonie Douglas, MD;  Location: [facility];  Service: Cardiovascular;  Laterality: N/A;   CATARACT EXTRACTION, BILATERAL     08/21, 09/21   HERNIA REPAIR     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   Procedure:  PERIPHERAL VASCULAR INTERVENTION;  Surgeon: Leonie Douglas, MD;  Location: [facility];  Service: Cardiovascular;  Laterality: Left;  LCCA     Family History  Problem Relation Age of Onset   COPD Father    Kidney failure Father      Social History   Tobacco Use   Smoking status: Former    Types: Cigarettes    Quit date: 09/05/2004    Years since quitting: 16.8   Smokeless tobacco: Never  Vaping Use   Vaping Use: Never used  Substance Use Topics   Alcohol use: Not Currently    Alcohol/week: 3.0 standard drinks    Types: 3 Standard drinks or equivalent per week    Comment: none   Drug use: Never      ROS Constitutional: Denies fever, chills, weight loss/gain, headaches, insomnia,  night sweats or change in appetite. Does c/o fatigue. Eyes: Denies redness, blurred vision, diplopia, discharge, itchy or watery eyes.  ENT: Denies discharge, congestion, post nasal drip, epistaxis, sore throat, earache, hearing loss, dental pain, Tinnitus, Vertigo, Sinus pain or snoring.  Cardio: Denies chest pain, palpitations, irregular heartbeat, syncope, dyspnea, diaphoresis, orthopnea, PND, claudication or edema Respiratory: denies cough, dyspnea, DOE, pleurisy, hoarseness, laryngitis or wheezing.  Gastrointestinal: Denies dysphagia, heartburn, reflux, water brash, pain, cramps, nausea, vomiting, bloating, diarrhea, constipation, hematemesis, melena, hematochezia, jaundice or hemorrhoids Genitourinary: Denies dysuria, frequency, urgency,  discharge, hematuria or flank pain. Has occasional urgency, nocturia x 1-2 x & occasional hesitancy. Musculoskeletal: Denies arthralgia, myalgia, stiffness, Jt. Swelling, pain, limp or strain/sprain. Denies Falls. Skin: Denies puritis, rash, hives, warts, acne, eczema or change in skin lesion Neuro: No weakness, tremor, incoordination, spasms, paresthesia or pain Psychiatric: Denies confusion, memory loss or sensory loss. Denies Depression. Endocrine:  Denies change in weight, skin, hair change, nocturia, and paresthesia, diabetic polys, visual blurring or hyper / hypo glycemic episodes.  Heme/Lymph: No excessive bleeding, bruising or enlarged lymph nodes.   Physical Exam  BP 138/70   Pulse 82   Temp 97.9 F (36.6 C)   Resp 16   Ht 5\' 9"  (1.753 m)   Wt 219 lb 6.4 oz (99.5 kg)   SpO2 95%   BMI 32.40 kg/m   General Appearance: Well nourished and well groomed and in no apparent distress.  Eyes: PERRLA, EOMs, conjunctiva no swelling or erythema, normal fundi and vessels. Sinuses: No frontal/maxillary tenderness ENT/Mouth: EACs patent / TMs  nl. Nares clear without erythema, swelling, mucoid exudates. Oral hygiene is  good. No erythema, swelling, or exudate. Tongue normal, non-obstructing. Tonsils not swollen or erythematous. Hearing normal.  Neck: Supple, thyroid not palpable. No bruits, nodes or JVD. Respiratory: Respiratory effort normal.  BS equal and clear bilateral without rales, rhonci, wheezing or stridor. Cardio: Heart sounds are soft with irregular rate & irregular  rhythm and no murmurs, rubs or gallops. Peripheral pulses are 1+/1+  and equal bilaterally without edema. No aortic or femoral bruits. Chest: symmetric with normal excursions and percussion.  Abdomen: Soft, with Nl bowel sounds. Nontender, no guarding, rebound, hernias, masses, or organomegaly.  Lymphatics: Non tender without lymphadenopathy.  Musculoskeletal: Full ROM all peripheral extremities, joint stability, 5/5 strength, and normal gait. Skin: Warm and dry without rashes, lesions, cyanosis, clubbing or  ecchymosis.  Neuro: Cranial nerves intact, reflexes equal bilaterally. Normal muscle tone, no cerebellar symptoms. Sensation intact.  Pysch: Alert and oriented X 3 with normal affect, insight and judgment appropriate.   Assessment and Plan  1. Annual Preventative/Screening Exam    2. Labile hypertension  - EKG 12-Lead - - Urinalysis, Routine w reflex  microscopic - Microalbumin / creatinine urine ratio - CBC with Differential/Platelet - COMPLETE METABOLIC PANEL WITH GFR - Magnesium - TSH   3. Hyperlipidemia, mixed  - EKG 12-Lead - Lipid panel - TSH   4. Abnormal glucose  - EKG 12-Lead - - Hemoglobin A1c - Insulin, random   5. Vitamin D deficiency  - VITAMIN D 25 Hydroxy   6. Hypothyroidism  - TSH  7. Atherosclerosis of native coronary artery                                                      of native heart without angina pectoris  - EKG 12-Lead - > Shows A fib & patient refuse treatment   - Lipid panel   8. BPH without obstruction/lower urinary tract symptoms  - PSA   9. 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 Seizures (HCC)  - Levetiracetam level   12. Screening for colorectal cancer  -   -  Refused   13. Screening for heart disease  - EKG 12-Lead   14. Former smoker  - EKG 12-Lead - [place], RETROPERITNL ABD,  LTD - Lipid panel   15. Screening for AAA (aortic abdominal aneurysm)  -  - Refused     16. Medication management  - CBC with Differential/Platelet - COMPLETE METABOLIC PANEL WITH GFR - Magnesium - Lipid panel - TSH - Hemoglobin A1c - Insulin, random - VITAMIN D 25 Hydroxy - Levetiracetam level           Patient was counseled in prudent diet, weight control to achieve/maintain BMI less than 25, BP monitoring, regular exercise and medications as discussed.  Discussed med effects and SE's. Routine screening labs and tests as requested with regular follow-up as recommended. Over 40 minutes of exam, counseling, chart review and high complex critical decision making was performed   Marinus Maw, MD

## 2023-07-02 NOTE — Patient Instructions (Signed)

## 2023-07-03 ENCOUNTER — Encounter: Payer: Self-pay | Admitting: Internal Medicine

## 2023-07-03 ENCOUNTER — Ambulatory Visit (INDEPENDENT_AMBULATORY_CARE_PROVIDER_SITE_OTHER): Payer: Medicare Other | Admitting: Internal Medicine

## 2023-07-03 VITALS — BP 138/70 | HR 82 | Temp 97.9°F | Resp 16 | Ht 69.0 in | Wt 219.4 lb

## 2023-07-03 DIAGNOSIS — E782 Mixed hyperlipidemia: Secondary | ICD-10-CM

## 2023-07-03 DIAGNOSIS — R7309 Other abnormal glucose: Secondary | ICD-10-CM | POA: Diagnosis not present

## 2023-07-03 DIAGNOSIS — I480 Paroxysmal atrial fibrillation: Secondary | ICD-10-CM | POA: Insufficient documentation

## 2023-07-03 DIAGNOSIS — Z79899 Other long term (current) drug therapy: Secondary | ICD-10-CM

## 2023-07-03 DIAGNOSIS — Z0000 Encounter for general adult medical examination without abnormal findings: Secondary | ICD-10-CM

## 2023-07-03 DIAGNOSIS — E559 Vitamin D deficiency, unspecified: Secondary | ICD-10-CM

## 2023-07-03 DIAGNOSIS — Z125 Encounter for screening for malignant neoplasm of prostate: Secondary | ICD-10-CM

## 2023-07-03 DIAGNOSIS — I700 Atherosclerosis of aorta: Secondary | ICD-10-CM | POA: Diagnosis not present

## 2023-07-03 DIAGNOSIS — J701 Chronic and other pulmonary manifestations due to radiation: Secondary | ICD-10-CM

## 2023-07-03 DIAGNOSIS — Z136 Encounter for screening for cardiovascular disorders: Secondary | ICD-10-CM

## 2023-07-03 DIAGNOSIS — Z0001 Encounter for general adult medical examination with abnormal findings: Secondary | ICD-10-CM

## 2023-07-03 DIAGNOSIS — R0989 Other specified symptoms and signs involving the circulatory and respiratory systems: Secondary | ICD-10-CM

## 2023-07-03 DIAGNOSIS — N400 Enlarged prostate without lower urinary tract symptoms: Secondary | ICD-10-CM

## 2023-07-03 DIAGNOSIS — Z87891 Personal history of nicotine dependence: Secondary | ICD-10-CM

## 2023-07-03 DIAGNOSIS — Z1211 Encounter for screening for malignant neoplasm of colon: Secondary | ICD-10-CM

## 2023-07-03 DIAGNOSIS — I2510 Atherosclerotic heart disease of native coronary artery without angina pectoris: Secondary | ICD-10-CM

## 2023-07-03 DIAGNOSIS — R569 Unspecified convulsions: Secondary | ICD-10-CM

## 2023-07-03 DIAGNOSIS — E039 Hypothyroidism, unspecified: Secondary | ICD-10-CM

## 2023-07-03 DIAGNOSIS — I4891 Unspecified atrial fibrillation: Secondary | ICD-10-CM | POA: Insufficient documentation

## 2023-07-04 LAB — URINALYSIS, ROUTINE W REFLEX MICROSCOPIC
Bilirubin Urine: NEGATIVE
Glucose, UA: NEGATIVE
Hgb urine dipstick: NEGATIVE
Ketones, ur: NEGATIVE
Leukocytes,Ua: NEGATIVE
Nitrite: NEGATIVE
Protein, ur: NEGATIVE
Specific Gravity, Urine: 1.011 (ref 1.001–1.035)
pH: 7 (ref 5.0–8.0)

## 2023-07-04 LAB — LIPID PANEL
Cholesterol: 173 mg/dL (ref <200)
HDL: 61 mg/dL (ref >=40)
LDL Cholesterol (Calc): 90 mg/dL
Non-HDL Cholesterol (Calc): 112 mg/dL (ref <130)
Total CHOL/HDL Ratio: 2.8 (calc) (ref <5.0)
Triglycerides: 119 mg/dL (ref <150)

## 2023-07-04 LAB — COMPLETE METABOLIC PANEL WITH GFR
AG Ratio: 1.3 (calc) (ref 1.0–2.5)
ALT: 16 U/L (ref 9–46)
AST: 28 U/L (ref 10–35)
Albumin: 3.8 g/dL (ref 3.6–5.1)
Alkaline phosphatase (APISO): 89 U/L (ref 35–144)
BUN: 15 mg/dL (ref 7–25)
CO2: 28 mmol/L (ref 20–32)
Calcium: 9.3 mg/dL (ref 8.6–10.3)
Chloride: 104 mmol/L (ref 98–110)
Creat: 1.25 mg/dL (ref 0.70–1.28)
Globulin: 3 g/dL (ref 1.9–3.7)
Glucose, Bld: 80 mg/dL (ref 65–99)
Potassium: 4.4 mmol/L (ref 3.5–5.3)
Sodium: 141 mmol/L (ref 135–146)
Total Bilirubin: 0.5 mg/dL (ref 0.2–1.2)
Total Protein: 6.8 g/dL (ref 6.1–8.1)
eGFR: 60 mL/min/{1.73_m2} (ref >=60)

## 2023-07-04 LAB — CBC WITH DIFFERENTIAL/PLATELET
Absolute Lymphocytes: 1263 {cells}/uL (ref 850–3900)
Absolute Monocytes: 1009 {cells}/uL — ABNORMAL HIGH (ref 200–950)
Basophils Absolute: 77 {cells}/uL (ref 0–200)
Basophils Relative: 1 %
Eosinophils Absolute: 177 {cells}/uL (ref 15–500)
Eosinophils Relative: 2.3 %
HCT: 45.8 % (ref 38.5–50.0)
Hemoglobin: 15 g/dL (ref 13.2–17.1)
MCH: 32.8 pg (ref 27.0–33.0)
MCHC: 32.8 g/dL (ref 32.0–36.0)
MCV: 100 fL (ref 80.0–100.0)
MPV: 9.9 fL (ref 7.5–12.5)
Monocytes Relative: 13.1 %
Neutro Abs: 5174 {cells}/uL (ref 1500–7800)
Neutrophils Relative %: 67.2 %
Platelets: 234 10*3/uL (ref 140–400)
RBC: 4.58 10*6/uL (ref 4.20–5.80)
RDW: 12.5 % (ref 11.0–15.0)
Total Lymphocyte: 16.4 %
WBC: 7.7 10*3/uL (ref 3.8–10.8)

## 2023-07-04 LAB — PSA: PSA: 0.33 ng/mL (ref <=4.00)

## 2023-07-04 LAB — TSH: TSH: 1.56 m[IU]/L (ref 0.40–4.50)

## 2023-07-04 LAB — MICROALBUMIN / CREATININE URINE RATIO
Creatinine, Urine: 100 mg/dL (ref 20–320)
Microalb Creat Ratio: 34 mg/g{creat} — ABNORMAL HIGH (ref <30)
Microalb, Ur: 3.4 mg/dL

## 2023-07-04 LAB — HEMOGLOBIN A1C
Hgb A1c MFr Bld: 5.7 %{Hb} — ABNORMAL HIGH (ref <5.7)
Mean Plasma Glucose: 117 mg/dL
eAG (mmol/L): 6.5 mmol/L

## 2023-07-04 LAB — VITAMIN D 25 HYDROXY (VIT D DEFICIENCY, FRACTURES): Vit D, 25-Hydroxy: 28 ng/mL — ABNORMAL LOW (ref 30–100)

## 2023-07-04 LAB — INSULIN, RANDOM: Insulin: 8.1 u[IU]/mL

## 2023-07-04 LAB — MAGNESIUM: Magnesium: 2.1 mg/dL (ref 1.5–2.5)

## 2023-07-04 NOTE — Progress Notes (Signed)
<>*<>*<>*<>*<>*<>*<>*<>*<>*<>*<>*<>*<>*<>*<>*<>*<>*<>*<>*<>*<>*<>*<>*<>*<> <>*<>*<>*<>*<>*<>*<>*<>*<>*<>*<>*<>*<>*<>*<>*<>*<>*<>*<>*<>*<>*<>*<>*<>*<>  -  Test results slightly outside the reference range are not unusual. If there is anything important, I will review this with you,  otherwise it is considered normal test values.  If you have further questions,  please do not hesitate to contact me at the office or via My Chart.   <>*<>*<>*<>*<>*<>*<>*<>*<>*<>*<>*<>*<>*<>*<>*<>*<>*<>*<>*<>*<>*<>*<>*<>*<> <>*<>*<>*<>*<>*<>*<>*<>*<>*<>*<>*<>*<>*<>*<>*<>*<>*<>*<>*<>*<>*<>*<>*<>*<>  -  A1c = 5.7% - borderline high normal elevated glucose   - Recommend    - Avoid Sweets, Candy & White Stuff   - White Rice, White Potatoes, White Flour  - Breads &  Pasta  <>*<>*<>*<>*<>*<>*<>*<>*<>*<>*<>*<>*<>*<>*<>*<>*<>*<>*<>*<>*<>*<>*<>*<>*<> <>*<>*<>*<>*<>*<>*<>*<>*<>*<>*<>*<>*<>*<>*<>*<>*<>*<>*<>*<>*<>*<>*<>*<>*<>  -  Vitamin D = 28 is EXTREMELY LOW  ( as usual )   - Vitamin D goal is between 70-100.   - Please take  Vitamin D  5,000 unit capsules  x 2 capsules =                                                       10,000 units / daily as previously recommended   - It is very important as a natural anti-inflammatory and helping the                           immune system protect against viral infections, like the Covid-19    - Vityamin D also helps hair, skin, and nails, as well as reducing stroke and heart attack risk.   - It helps your bones and helps with mood.  - It also decreases numerous cancer risks so please                                                                                           take it as directed.   - Low Vit D is associated with a 200-300% higher risk for CANCER   and 200-300% higher risk for HEART   ATTACK  &  STROKE.    - Low Vitamin D  is also associated with higher death rate at younger ages,   autoimmune diseases like Rheumatoid arthritis, Lupus, Multiple  Sclerosis.     - Also many other serious conditions, like depression, Alzheimer's  Dementia,  muscle aches, fatigue, fibromyalgia   <>*<>*<>*<>*<>*<>*<>*<>*<>*<>*<>*<>*<>*<>*<>*<>*<>*<>*<>*<>*<>*<>*<>*<>*<> <>*<>*<>*<>*<>*<>*<>*<>*<>*<>*<>*<>*<>*<>*<>*<>*<>*<>*<>*<>*<>*<>*<>*<>*<>  -  PSA very low - No Prostate Cancer  -  Great !   <>*<>*<>*<>*<>*<>*<>*<>*<>*<>*<>*<>*<>*<>*<>*<>*<>*<>*<>*<>*<>*<>*<>*<>*<> <>*<>*<>*<>*<>*<>*<>*<>*<>*<>*<>*<>*<>*<>*<>*<>*<>*<>*<>*<>*<>*<>*<>*<>*<>  -  Chol = 173  =  Excellent   - Very low risk for Heart Attack  / Stroke  <>*<>*<>*<>*<>*<>*<>*<>*<>*<>*<>*<>*<>*<>*<>*<>*<>*<>*<>*<>*<>*<>*<>*<>*<> <>*<>*<>*<>*<>*<>*<>*<>*<>*<>*<>*<>*<>*<>*<>*<>*<>*<>*<>*<>*<>*<>*<>*<>*<>  -  All Else - CBC - Kidneys - Electrolytes - Liver - Magnesium & Thyroid    - all  Normal / OK  <>*<>*<>*<>*<>*<>*<>*<>*<>*<>*<>*<>*<>*<>*<>*<>*<>*<>*<>*<>*<>*<>*<>*<>*<> <>*<>*<>*<>*<>*<>*<>*<>*<>*<>*<>*<>*<>*<>*<>*<>*<>*<>*<>*<>*<>*<>*<>*<>*<>

## 2023-09-16 ENCOUNTER — Other Ambulatory Visit: Payer: Self-pay | Admitting: Nurse Practitioner

## 2023-10-03 ENCOUNTER — Ambulatory Visit: Payer: Medicare Other | Admitting: Nurse Practitioner

## 2023-10-04 ENCOUNTER — Other Ambulatory Visit: Payer: Self-pay | Admitting: Nurse Practitioner

## 2023-10-04 DIAGNOSIS — J4520 Mild intermittent asthma, uncomplicated: Secondary | ICD-10-CM

## 2023-10-05 ENCOUNTER — Ambulatory Visit: Payer: Medicare Other | Admitting: Nurse Practitioner

## 2023-10-30 ENCOUNTER — Inpatient Hospital Stay (HOSPITAL_BASED_OUTPATIENT_CLINIC_OR_DEPARTMENT_OTHER)
Admission: EM | Admit: 2023-10-30 | Discharge: 2023-11-02 | DRG: 193 | Disposition: A | Discharge status: Home / Self Care | Payer: Medicare Other | Attending: Internal Medicine | Admitting: Internal Medicine

## 2023-10-30 ENCOUNTER — Ambulatory Visit: Payer: Self-pay | Admitting: Internal Medicine

## 2023-10-30 ENCOUNTER — Emergency Department (HOSPITAL_BASED_OUTPATIENT_CLINIC_OR_DEPARTMENT_OTHER): Payer: Medicare Other

## 2023-10-30 ENCOUNTER — Encounter (HOSPITAL_BASED_OUTPATIENT_CLINIC_OR_DEPARTMENT_OTHER): Payer: Self-pay | Admitting: Emergency Medicine

## 2023-10-30 DIAGNOSIS — D630 Anemia in neoplastic disease: Secondary | ICD-10-CM | POA: Diagnosis not present

## 2023-10-30 DIAGNOSIS — I10 Essential (primary) hypertension: Secondary | ICD-10-CM | POA: Diagnosis not present

## 2023-10-30 DIAGNOSIS — Z79899 Other long term (current) drug therapy: Secondary | ICD-10-CM | POA: Diagnosis not present

## 2023-10-30 DIAGNOSIS — I480 Paroxysmal atrial fibrillation: Secondary | ICD-10-CM | POA: Diagnosis not present

## 2023-10-30 DIAGNOSIS — Z1152 Encounter for screening for COVID-19: Secondary | ICD-10-CM | POA: Diagnosis not present

## 2023-10-30 DIAGNOSIS — E039 Hypothyroidism, unspecified: Secondary | ICD-10-CM | POA: Diagnosis not present

## 2023-10-30 DIAGNOSIS — R918 Other nonspecific abnormal finding of lung field: Secondary | ICD-10-CM | POA: Diagnosis not present

## 2023-10-30 DIAGNOSIS — Z79890 Hormone replacement therapy (postmenopausal): Secondary | ICD-10-CM

## 2023-10-30 DIAGNOSIS — Z87891 Personal history of nicotine dependence: Secondary | ICD-10-CM | POA: Diagnosis not present

## 2023-10-30 DIAGNOSIS — Z841 Family history of disorders of kidney and ureter: Secondary | ICD-10-CM

## 2023-10-30 DIAGNOSIS — E66811 Obesity, class 1: Secondary | ICD-10-CM | POA: Diagnosis present

## 2023-10-30 DIAGNOSIS — I700 Atherosclerosis of aorta: Secondary | ICD-10-CM | POA: Diagnosis present

## 2023-10-30 DIAGNOSIS — J701 Chronic and other pulmonary manifestations due to radiation: Secondary | ICD-10-CM | POA: Diagnosis present

## 2023-10-30 DIAGNOSIS — Z888 Allergy status to other drugs, medicaments and biological substances status: Secondary | ICD-10-CM

## 2023-10-30 DIAGNOSIS — C3490 Malignant neoplasm of unspecified part of unspecified bronchus or lung: Secondary | ICD-10-CM | POA: Diagnosis present

## 2023-10-30 DIAGNOSIS — D649 Anemia, unspecified: Secondary | ICD-10-CM | POA: Diagnosis present

## 2023-10-30 DIAGNOSIS — Y842 Radiological procedure and radiotherapy as the cause of abnormal reaction of the patient, or of later complication, without mention of misadventure at the time of the procedure: Secondary | ICD-10-CM | POA: Diagnosis present

## 2023-10-30 DIAGNOSIS — I4892 Unspecified atrial flutter: Secondary | ICD-10-CM | POA: Diagnosis not present

## 2023-10-30 DIAGNOSIS — E782 Mixed hyperlipidemia: Secondary | ICD-10-CM | POA: Diagnosis present

## 2023-10-30 DIAGNOSIS — J4489 Other specified chronic obstructive pulmonary disease: Secondary | ICD-10-CM | POA: Diagnosis present

## 2023-10-30 DIAGNOSIS — J440 Chronic obstructive pulmonary disease with acute lower respiratory infection: Secondary | ICD-10-CM | POA: Diagnosis present

## 2023-10-30 DIAGNOSIS — J189 Pneumonia, unspecified organism: Principal | ICD-10-CM | POA: Diagnosis present

## 2023-10-30 DIAGNOSIS — I2510 Atherosclerotic heart disease of native coronary artery without angina pectoris: Secondary | ICD-10-CM | POA: Diagnosis not present

## 2023-10-30 DIAGNOSIS — R590 Localized enlarged lymph nodes: Secondary | ICD-10-CM | POA: Diagnosis not present

## 2023-10-30 DIAGNOSIS — Z8673 Personal history of transient ischemic attack (TIA), and cerebral infarction without residual deficits: Secondary | ICD-10-CM

## 2023-10-30 DIAGNOSIS — I4891 Unspecified atrial fibrillation: Secondary | ICD-10-CM | POA: Diagnosis not present

## 2023-10-30 DIAGNOSIS — J9601 Acute respiratory failure with hypoxia: Secondary | ICD-10-CM | POA: Diagnosis present

## 2023-10-30 DIAGNOSIS — E559 Vitamin D deficiency, unspecified: Secondary | ICD-10-CM | POA: Diagnosis present

## 2023-10-30 DIAGNOSIS — F3341 Major depressive disorder, recurrent, in partial remission: Secondary | ICD-10-CM | POA: Diagnosis present

## 2023-10-30 DIAGNOSIS — J439 Emphysema, unspecified: Secondary | ICD-10-CM | POA: Diagnosis not present

## 2023-10-30 DIAGNOSIS — Z825 Family history of asthma and other chronic lower respiratory diseases: Secondary | ICD-10-CM

## 2023-10-30 DIAGNOSIS — G40909 Epilepsy, unspecified, not intractable, without status epilepticus: Secondary | ICD-10-CM | POA: Diagnosis present

## 2023-10-30 DIAGNOSIS — E785 Hyperlipidemia, unspecified: Secondary | ICD-10-CM | POA: Diagnosis not present

## 2023-10-30 DIAGNOSIS — G629 Polyneuropathy, unspecified: Secondary | ICD-10-CM

## 2023-10-30 DIAGNOSIS — J441 Chronic obstructive pulmonary disease with (acute) exacerbation: Principal | ICD-10-CM | POA: Diagnosis present

## 2023-10-30 DIAGNOSIS — J90 Pleural effusion, not elsewhere classified: Secondary | ICD-10-CM | POA: Diagnosis not present

## 2023-10-30 DIAGNOSIS — N400 Enlarged prostate without lower urinary tract symptoms: Secondary | ICD-10-CM | POA: Diagnosis present

## 2023-10-30 DIAGNOSIS — N320 Bladder-neck obstruction: Secondary | ICD-10-CM | POA: Diagnosis present

## 2023-10-30 DIAGNOSIS — R062 Wheezing: Secondary | ICD-10-CM | POA: Diagnosis not present

## 2023-10-30 DIAGNOSIS — Z6830 Body mass index (BMI) 30.0-30.9, adult: Secondary | ICD-10-CM

## 2023-10-30 DIAGNOSIS — J984 Other disorders of lung: Secondary | ICD-10-CM | POA: Diagnosis not present

## 2023-10-30 LAB — MAGNESIUM: Magnesium: 1.9 mg/dL (ref 1.7–2.4)

## 2023-10-30 LAB — TROPONIN I (HIGH SENSITIVITY)
Troponin I (High Sensitivity): 10 ng/L (ref <18)
Troponin I (High Sensitivity): 11 ng/L (ref <18)

## 2023-10-30 LAB — BASIC METABOLIC PANEL
Anion gap: 11 (ref 5–15)
BUN: 17 mg/dL (ref 8–23)
CO2: 26 mmol/L (ref 22–32)
Calcium: 9.7 mg/dL (ref 8.9–10.3)
Chloride: 99 mmol/L (ref 98–111)
Creatinine, Ser: 1.03 mg/dL (ref 0.61–1.24)
GFR, Estimated: >60 mL/min (ref >60)
Glucose, Bld: 104 mg/dL — ABNORMAL HIGH (ref 70–99)
Potassium: 4.1 mmol/L (ref 3.5–5.1)
Sodium: 136 mmol/L (ref 135–145)

## 2023-10-30 LAB — CBC
HCT: 41.7 % (ref 39.0–52.0)
Hemoglobin: 13.6 g/dL (ref 13.0–17.0)
MCH: 31.9 pg (ref 26.0–34.0)
MCHC: 32.6 g/dL (ref 30.0–36.0)
MCV: 97.9 fL (ref 80.0–100.0)
Platelets: 365 10*3/uL (ref 150–400)
RBC: 4.26 MIL/uL (ref 4.22–5.81)
RDW: 12.7 % (ref 11.5–15.5)
WBC: 11.3 10*3/uL — ABNORMAL HIGH (ref 4.0–10.5)
nRBC: 0 % (ref 0.0–0.2)

## 2023-10-30 LAB — RESP PANEL BY RT-PCR (RSV, FLU A&B, COVID)  RVPGX2
Influenza A by PCR: NEGATIVE
Influenza B by PCR: NEGATIVE
Resp Syncytial Virus by PCR: NEGATIVE
SARS Coronavirus 2 by RT PCR: NEGATIVE

## 2023-10-30 LAB — BRAIN NATRIURETIC PEPTIDE: B Natriuretic Peptide: 237.5 pg/mL — ABNORMAL HIGH (ref 0.0–100.0)

## 2023-10-30 MED ORDER — IPRATROPIUM-ALBUTEROL 0.5-2.5 (3) MG/3ML IN SOLN: 3.0000 mL | Freq: Once | RESPIRATORY_TRACT | Status: DC

## 2023-10-30 MED ORDER — LEVALBUTEROL HCL 0.63 MG/3ML IN NEBU
0.6300 mg | INHALATION_SOLUTION | Freq: Three times a day (TID) | RESPIRATORY_TRACT | Status: AC
  Administered 2023-10-31: 0.63 mg via RESPIRATORY_TRACT
  Filled 2023-10-30 (×2): qty 3

## 2023-10-30 MED ORDER — GUAIFENESIN 100 MG/5ML PO LIQD: 5.0000 mL | ORAL | Status: DC | PRN

## 2023-10-30 MED ORDER — ALBUTEROL SULFATE (2.5 MG/3ML) 0.083% IN NEBU: 5.0000 mg | INHALATION_SOLUTION | Freq: Once | RESPIRATORY_TRACT | Status: DC

## 2023-10-30 MED ORDER — METHYLPREDNISOLONE SODIUM SUCC 125 MG IJ SOLR
125.0000 mg | Freq: Once | INTRAMUSCULAR | Status: AC
  Administered 2023-10-30: 125 mg via INTRAVENOUS
  Filled 2023-10-30: qty 2

## 2023-10-30 MED ORDER — MAGNESIUM SULFATE 2 GM/50ML IV SOLN
2.0000 g | Freq: Once | INTRAVENOUS | Status: AC
  Administered 2023-10-30: 2 g via INTRAVENOUS
  Filled 2023-10-30: qty 50

## 2023-10-30 MED ORDER — IPRATROPIUM-ALBUTEROL 0.5-2.5 (3) MG/3ML IN SOLN
3.0000 mL | RESPIRATORY_TRACT | Status: DC | PRN
  Administered 2023-10-30: 3 mL via RESPIRATORY_TRACT
  Filled 2023-10-30: qty 3

## 2023-10-30 MED ORDER — LEVALBUTEROL HCL 0.63 MG/3ML IN NEBU: 0.6300 mg | INHALATION_SOLUTION | Freq: Once | RESPIRATORY_TRACT | Status: DC

## 2023-10-30 MED ORDER — LEVALBUTEROL HCL 0.63 MG/3ML IN NEBU
INHALATION_SOLUTION | RESPIRATORY_TRACT | Status: AC
  Administered 2023-10-30: 0.63 mg
  Filled 2023-10-30: qty 3

## 2023-10-30 MED ORDER — ALBUTEROL SULFATE (2.5 MG/3ML) 0.083% IN NEBU: 2.5000 mg | INHALATION_SOLUTION | RESPIRATORY_TRACT | Status: DC | PRN

## 2023-10-30 MED ORDER — IOHEXOL 350 MG/ML SOLN
100.0000 mL | Freq: Once | INTRAVENOUS | Status: AC | PRN
  Administered 2023-10-30: 75 mL via INTRAVENOUS

## 2023-10-30 NOTE — ED Triage Notes (Signed)
 Pt c/o shob with cough and wheezing, worse with exertion x 3 days. Pt assessed by RT in triage

## 2023-10-30 NOTE — ED Notes (Signed)
 RT Note:  Patient was placed on 2lpm [place] due to increased WOB

## 2023-10-30 NOTE — ED Notes (Signed)
 Patient back from CT.

## 2023-10-30 NOTE — ED Provider Notes (Signed)
 [place] EMERGENCY DEPARTMENT AT [facility] Provider Note   CSN: [id number] Arrival date & time: 10/30/23  1752     History  Chief Complaint  Patient presents with   Shortness of Breath    Christopher Peck is a 78 y.o. male, history of A-fib, without treatment, hypertension, emphysema, who presents to the ED secondary to worsening shortness of breath, productive cough, that is been going on for the last week.  He denies any fevers, chills, but does state that he has some some pain, and his shoulders, knees, hips.  He notes that he has not been having any chest pain, has been using his albuterol every 6 hours, without relief.  Notes that he feels like he cannot catch his breath.  No sick contacts    Home Medications Prior to Admission medications   Medication Sig Start Date End Date Taking? Authorizing Provider  albuterol (ACCUNEB) 1.25 MG/3ML nebulizer solution USE 1 VIAL VIA NEBULIZER EVERY 6 HOURS AS NEEDED FOR WHEEZING 10/04/23   Cranford, Archie Patten, NP  albuterol (VENTOLIN HFA) 108 (90 Base) MCG/ACT inhaler USE 2 INHALATIONS 15 MINUTES APART EVERY 4 HOURS AS NEEDED TO RESCUE ASTHMA ATTACK 04/17/23   Raynelle Dick, NP  citalopram (CELEXA) 20 MG tablet TAKE 1 TABLET BY MOUTH EVERY DAY FOR MOOD 03/01/23   Raynelle Dick, NP  finasteride (PROSCAR) 5 MG tablet TAKE 1 TABLET (5 MG TOTAL) BY MOUTH DAILY. 09/18/23 03/16/24  Raynelle Dick, NP  levothyroxine (SYNTHROID) 125 MCG tablet TAKE 1 TABLET DAILY ON AN EMPTY STOMACH WITH WATER FOR 30 MINUTES & NO ANTACID MEDS, CALCIUM OR MAGNESIUM FOR 4 HOURS & AVOID BIOTIN 03/02/23   Raynelle Dick, NP      Allergies    Markus Daft aerosphere [budeson-glycopyrrol-formoterol] and Spiractone [spironolactone]    Review of Systems   Review of Systems  Respiratory:  Positive for shortness of breath.   Cardiovascular:  Negative for chest pain.    Physical Exam Updated Vital Signs BP (!) 142/94   Pulse 85   Temp 98.6 F (37 C) (Oral)    Resp 20   Wt 99.8 kg   SpO2 98%   BMI 32.49 kg/m  Physical Exam Vitals and nursing note reviewed.  Constitutional:      General: He is not in acute distress.    Appearance: He is well-developed.  HENT:     Head: Normocephalic and atraumatic.  Eyes:     Conjunctiva/sclera: Conjunctivae normal.  Cardiovascular:     Rate and Rhythm: Normal rate and regular rhythm.     Heart sounds: No murmur heard. Pulmonary:     Effort: Tachypnea and respiratory distress present.     Breath sounds: Decreased breath sounds present.     Comments: Expiratory and inspiratory wheezing.  No retractions noted on exam.  Has increased work of breathing however with tachypnea, and requiring 2 L O2, to speak in full sentences Abdominal:     Palpations: Abdomen is soft.     Tenderness: There is no abdominal tenderness.  Musculoskeletal:        General: No swelling.     Cervical back: Neck supple.  Skin:    General: Skin is warm and dry.     Capillary Refill: Capillary refill takes less than 2 seconds.  Neurological:     Mental Status: He is alert.  Psychiatric:        Mood and Affect: Mood normal.     ED Results / Procedures /  Treatments   Labs (all labs ordered are listed, but only abnormal results are displayed) Labs Reviewed  BASIC METABOLIC PANEL - Abnormal; Notable for the following components:      Result Value   Glucose, Bld 104 (*)    All other components within normal limits  CBC - Abnormal; Notable for the following components:   WBC 11.3 (*)    All other components within normal limits  BRAIN NATRIURETIC PEPTIDE - Abnormal; Notable for the following components:   B Natriuretic Peptide 237.5 (*)    All other components within normal limits  RESP PANEL BY RT-PCR (RSV, FLU A&B, COVID)  RVPGX2  MAGNESIUM  TROPONIN I (HIGH SENSITIVITY)  TROPONIN I (HIGH SENSITIVITY)    EKG EKG Interpretation Date/Time:  Monday October 30 2023 18:04:38 EST Ventricular Rate:  81 PR Interval:     QRS Duration:  76 QT Interval:  358 QTC Calculation: 415 R Axis:   21  Text Interpretation: Atrial fibrillation Septal infarct (cited on or before 30-Oct-2023) Abnormal ECG When compared with ECG of 30-Oct-2023 18:03, No significant change was found Confirmed by Coralee Pesa [phone]) on 10/30/2023 7:56:51 PM  Radiology CT Angio Chest PE W and/or Wo Contrast Result Date: 10/30/2023 CLINICAL DATA:  Shortness of breath and cough for 3 days. History of lung cancer. EXAM: CT ANGIOGRAPHY CHEST WITH CONTRAST TECHNIQUE: Multidetector CT imaging of the chest was performed using the standard protocol during bolus administration of intravenous contrast. Multiplanar CT image reconstructions and MIPs were obtained to evaluate the vascular anatomy. RADIATION DOSE REDUCTION: This exam was performed according to the departmental dose-optimization program which includes automated exposure control, adjustment of the mA and/or kV according to patient size and/or use of iterative reconstruction technique. CONTRAST:  75mL OMNIPAQUE IOHEXOL 350 MG/ML SOLN COMPARISON:  Chest CT from 2017 FINDINGS: Cardiovascular: The heart is normal in size for age. No pericardial effusion. The aorta is normal in caliber. Scattered atherosclerotic calcifications. No dissection. Scattered coronary artery calcifications. The pulmonary arterial tree is well opacified. No filling defects to suggest pulmonary embolism. Mediastinum/Nodes: Mildly enlarged mediastinal and hilar lymph nodes. 15 mm right hilar node previously measured 6 mm. The esophagus is grossly normal. Lungs/Pleura: Moderate pleural thickening medially involving the left hemithorax may be progressive radiation fibrosis. There is a loculated left pleural fluid collection new since the prior chest CT. This measures as simple fluid. Associated loss of volume in the left hemithorax and dense radiation changes involving the paramediastinal lung tissue. No findings suspicious for recurrent  tumor. Severe underlying lung disease with emphysema and respiratory bronchiolitis. No worrisome pulmonary nodules to suggest metastatic disease. The central tracheobronchial tree is unremarkable. Upper Abdomen: No significant upper abdominal findings. No hepatic or adrenal gland lesions to suggest metastatic disease. Scattered aortic calcifications. No upper abdominal adenopathy. Musculoskeletal: No significant bony findings. Exaggerated thoracic kyphosis and midthoracic compression deformities. No worrisome bone lesions. Review of the MIP images confirms the above findings. IMPRESSION: 1. No CT findings for pulmonary embolism. 2. Normal caliber thoracic aorta with scattered atherosclerotic calcifications. 3. Scattered coronary artery calcifications. 4. Moderate pleural thickening medially involving the left hemithorax may be progressive radiation fibrosis. There is a loculated left pleural fluid collection new since the prior chest CT. This measures as simple fluid. Associated loss of volume in the left hemithorax and dense radiation changes involving the paramediastinal lung tissue. No findings suspicious for recurrent tumor. 5. Severe underlying lung disease with emphysema and respiratory bronchiolitis. 6. Mildly enlarged mediastinal and hilar lymph  nodes. These are likely reactive. Electronically Signed   By: Rudie Meyer M.D.   On: 10/30/2023 22:00   DG Chest Port 1 View Result Date: 10/30/2023 CLINICAL DATA:  Atrial fibrillation with cough and wheezing. EXAM: PORTABLE CHEST 1 VIEW COMPARISON:  Chest x-ray 11/25/2020 FINDINGS: There is a moderate-sized left pleural effusion. Chronic opacity in the left suprahilar area with volume loss again noted. Right lung is clear. Cardiomediastinal silhouette is stable and within normal limits. No acute fractures are seen. IMPRESSION: 1. New moderate-sized left pleural effusion. Can not exclude underlying atelectasis/airspace disease. 2. Chronic opacity in the left  suprahilar area with volume loss again noted. Electronically Signed   By: Darliss Cheney M.D.   On: 10/30/2023 20:55    Procedures Procedures    Medications Ordered in ED Medications  albuterol (PROVENTIL) (2.5 MG/3ML) 0.083% nebulizer solution 5 mg (0 mg Nebulization Hold 10/30/23 1827)  levalbuterol (XOPENEX) nebulizer solution 0.63 mg (0.63 mg Nebulization Not Given 10/30/23 1930)  guaiFENesin (ROBITUSSIN) 100 MG/5ML liquid 5 mL (has no administration in time range)  albuterol (PROVENTIL) (2.5 MG/3ML) 0.083% nebulizer solution 2.5 mg (has no administration in time range)  levalbuterol (XOPENEX) nebulizer solution 0.63 mg (0.63 mg Nebulization Not Given 10/30/23 2329)  methylPREDNISolone sodium succinate (SOLU-MEDROL) 125 mg/2 mL injection 125 mg (125 mg Intravenous Given 10/30/23 1949)  magnesium sulfate IVPB 2 g 50 mL (0 g Intravenous Stopped 10/30/23 2118)  levalbuterol (XOPENEX) 0.63 MG/3ML nebulizer solution (0.63 mg  Given 10/30/23 1929)  iohexol (OMNIPAQUE) 350 MG/ML injection 100 mL (75 mLs Intravenous Contrast Given 10/30/23 2009)    ED Course/ Medical Decision Making/ A&P                                 Medical Decision Making Patient is a 78 year old male, here for severe shortness of breath, has been going on for the last week, he has had increased sputum, history of COPD, and radiation, fibrosis.  He has a history of A-fib, but has never sought care, for this as he has had disagreements with cardiologist in the past.  He is not on any blood thinners for this.  Initially his rate was 130s, but has now gone down to 73 while in the room.  He is overall very dyspneic on exam, tachypneic, and has expiratory wheezing, and severely wheezing throughout.  We have started him on levoalbuterol, given history of A-fib, with tachycardia.  Will also obtain BNP, and troponin for further evaluation.  CTA PE study ordered given A-fib, with no anticoagulation now severe shortness of breath  Amount  and/or Complexity of Data Reviewed Labs: ordered.    Details: Troponins within normal limits, BNP within normal limits Radiology: ordered.    Details: Chest x-ray shows new moderate size left pleural effusion, CTA shows respiratory bronchiolitis, as well as severe emphysema, and radiation fibrosis Discussion of management or test interpretation with external provider(s): Patient is still very dyspneic, unable to walk to the bathroom, without severe shortness of breath, requiring 2 L, O2 sat in the high 90s, he has improved a little bit after the breathing treatments, however, given his persistent dyspnea, difficulty doing daily activities, new effusion, and bronchiolitis as well as COPD exacerbation, we will admit him, for further breathing treatments, further evaluation of this effusion, as well as likely cardiology consultation secondary to uncontrolled A-fib, without anticoagulation.  Admitted to Dr. Joneen Roach, she accepts admission  Risk Prescription drug  management. Decision regarding hospitalization.    Final Clinical Impression(s) / ED Diagnoses Final diagnoses:  COPD exacerbation (HCC)  Atrial fibrillation, unspecified type (HCC)  Radiation fibrosis of lung (HCC)  Pleural effusion on left    Rx / DC Orders ED Discharge Orders     None         Reilley Latorre Elbert Ewings, PA 10/30/23 2331    Rozelle Logan, DO 10/30/23 2335

## 2023-10-30 NOTE — Telephone Encounter (Signed)
 Chief Complaint: SOB, joint swelling Symptoms: moderate SOB, joint pain in hands/shoulders/knees, moderate swelling to bilateral hands, post nasal drip, productive cough Frequency: x 2 weeks Pertinent Negatives: Patient's wife denies leg or ankle swelling, fever Disposition: [x] ED /[] Urgent Care (no appt availability in office) / [] Appointment(In office/virtual)/ []  [facility]/ [] Home Care/ [] Refused Recommended Disposition /[]  Mobile Bus/ []  Follow-up with PCP Additional Notes: Wife states it started several weeks ago patient was complaining of dry eyes. She states she feels like his COPD started to flare up shortly after with wheezing and a productive cough. She states also pain in his joints and now swelling in his hands. Wife placed patient on the phone and patient SOB while speaking and actively wheezing. Patient states if he has to get up and walk around or go up the stairs he has to stop and gasp for air. Advised patient call 911 or go straight to ED and patient is agreeable.  Copied from CRM [id number]. Topic: Clinical - Red Word Triage >> Oct 30, 2023  3:44 PM Lovey Newcomer R wrote: Red Word that prompted transfer to Nurse Triage: Swelling in both hands, painful in hands and knees. Pt has COPD so wife thinks he is having a flare up. He is wheezing and coughing up mucous, O2 is at 92-94%. Nebulizer not working. This has been about the last 4 days. Does not have a pcp until 3/27 with Dr. Constance Goltz. Reason for Disposition  [1] MODERATE difficulty breathing (e.g., speaks in phrases, SOB even at rest, pulse 100-120) AND [2] NEW-onset or WORSE than normal  Answer Assessment - Initial Assessment Questions 1. RESPIRATORY STATUS: "Describe your breathing?" (e.g., wheezing, shortness of breath, unable to speak, severe coughing)      Wheezing and rattling breathing sounds with productive cough, wife states it clears up after he coughs up the mucus.  2. ONSET: "When did this breathing  problem begin?"      X 2 weeks.  3. PATTERN "Does the difficult breathing come and go, or has it been constant since it started?"      Pretty constant, improves after nebulizer.  4. SEVERITY: "How bad is your breathing?" (e.g., mild, moderate, severe)    - MILD: No SOB at rest, mild SOB with walking, speaks normally in sentences, can lie down, no retractions, pulse < 100.    - MODERATE: SOB at rest, SOB with minimal exertion and prefers to sit, cannot lie down flat, speaks in phrases, mild retractions, audible wheezing, pulse 100-120.    - SEVERE: Very SOB at rest, speaks in single words, struggling to breathe, sitting hunched forward, retractions, pulse > 120      Wife states his breathing has been rapid, SOB with minimal exertion. She states sometimes when he talks he is even out of breath.  5. RECURRENT SYMPTOM: "Have you had difficulty breathing before?" If Yes, ask: "When was the last time?" and "What happened that time?"      Wife states he has had COPD flare ups in the past with wheezing in 2023 when he had RSV.  6. CARDIAC HISTORY: "Do you have any history of heart disease?" (e.g., heart attack, angina, bypass surgery, angioplasty)      Cardiac stent placed 2 years ago.  7. LUNG HISTORY: "Do you have any history of lung disease?"  (e.g., pulmonary embolus, asthma, emphysema)     COPD.  8. CAUSE: "What do you think is causing the breathing problem?"      Wife states  she thinks it is a COPD flare up.  9. OTHER SYMPTOMS: "Do you have any other symptoms? (e.g., dizziness, runny nose, cough, chest pain, fever)     Facial flushing (she states it turns deep red, almost purple)  10. O2 SATURATION MONITOR:  "Do you use an oxygen saturation monitor (pulse oximeter) at home?" If Yes, ask: "What is your reading (oxygen level) today?" "What is your usual oxygen saturation reading?" (e.g., 95%)       At rest it is 96% and when standing it is 92%.   11. TRAVEL: "Have you traveled out of the  country in the last month?" (e.g., travel history, exposures)       Denies travel, denies exposures.  Protocols used: Breathing Difficulty-A-AH

## 2023-10-30 NOTE — ED Notes (Addendum)
 RT Note: PA-C  decided to give patient Xopenex for first dose and to d/c the Duoneb for first dose

## 2023-10-30 NOTE — ED Notes (Signed)
 RT Note: Breathing treatments are held at this time due to patient having A-Fib with RVR.    RT will wait until the physician makes that decision to do treatment.

## 2023-10-31 ENCOUNTER — Inpatient Hospital Stay (HOSPITAL_COMMUNITY): Payer: Medicare Other

## 2023-10-31 ENCOUNTER — Other Ambulatory Visit: Payer: Self-pay

## 2023-10-31 ENCOUNTER — Encounter (HOSPITAL_COMMUNITY): Payer: Self-pay | Admitting: Family Medicine

## 2023-10-31 DIAGNOSIS — I2510 Atherosclerotic heart disease of native coronary artery without angina pectoris: Secondary | ICD-10-CM | POA: Diagnosis present

## 2023-10-31 DIAGNOSIS — J90 Pleural effusion, not elsewhere classified: Secondary | ICD-10-CM | POA: Diagnosis not present

## 2023-10-31 DIAGNOSIS — Y842 Radiological procedure and radiotherapy as the cause of abnormal reaction of the patient, or of later complication, without mention of misadventure at the time of the procedure: Secondary | ICD-10-CM | POA: Diagnosis present

## 2023-10-31 DIAGNOSIS — J439 Emphysema, unspecified: Secondary | ICD-10-CM | POA: Diagnosis present

## 2023-10-31 DIAGNOSIS — I700 Atherosclerosis of aorta: Secondary | ICD-10-CM | POA: Diagnosis present

## 2023-10-31 DIAGNOSIS — E039 Hypothyroidism, unspecified: Secondary | ICD-10-CM | POA: Diagnosis present

## 2023-10-31 DIAGNOSIS — Z1152 Encounter for screening for COVID-19: Secondary | ICD-10-CM | POA: Diagnosis not present

## 2023-10-31 DIAGNOSIS — F3341 Major depressive disorder, recurrent, in partial remission: Secondary | ICD-10-CM | POA: Diagnosis present

## 2023-10-31 DIAGNOSIS — N400 Enlarged prostate without lower urinary tract symptoms: Secondary | ICD-10-CM | POA: Diagnosis present

## 2023-10-31 DIAGNOSIS — E66811 Obesity, class 1: Secondary | ICD-10-CM | POA: Diagnosis present

## 2023-10-31 DIAGNOSIS — Z79890 Hormone replacement therapy (postmenopausal): Secondary | ICD-10-CM | POA: Diagnosis not present

## 2023-10-31 DIAGNOSIS — I4891 Unspecified atrial fibrillation: Secondary | ICD-10-CM

## 2023-10-31 DIAGNOSIS — I480 Paroxysmal atrial fibrillation: Secondary | ICD-10-CM | POA: Diagnosis present

## 2023-10-31 DIAGNOSIS — I10 Essential (primary) hypertension: Secondary | ICD-10-CM | POA: Diagnosis not present

## 2023-10-31 DIAGNOSIS — J189 Pneumonia, unspecified organism: Secondary | ICD-10-CM | POA: Diagnosis present

## 2023-10-31 DIAGNOSIS — J440 Chronic obstructive pulmonary disease with acute lower respiratory infection: Secondary | ICD-10-CM | POA: Diagnosis present

## 2023-10-31 DIAGNOSIS — C3490 Malignant neoplasm of unspecified part of unspecified bronchus or lung: Secondary | ICD-10-CM | POA: Diagnosis not present

## 2023-10-31 DIAGNOSIS — Z48813 Encounter for surgical aftercare following surgery on the respiratory system: Secondary | ICD-10-CM | POA: Diagnosis not present

## 2023-10-31 DIAGNOSIS — G40909 Epilepsy, unspecified, not intractable, without status epilepticus: Secondary | ICD-10-CM | POA: Diagnosis present

## 2023-10-31 DIAGNOSIS — E785 Hyperlipidemia, unspecified: Secondary | ICD-10-CM

## 2023-10-31 DIAGNOSIS — D649 Anemia, unspecified: Secondary | ICD-10-CM | POA: Diagnosis present

## 2023-10-31 DIAGNOSIS — I4892 Unspecified atrial flutter: Secondary | ICD-10-CM | POA: Diagnosis present

## 2023-10-31 DIAGNOSIS — E782 Mixed hyperlipidemia: Secondary | ICD-10-CM | POA: Diagnosis present

## 2023-10-31 DIAGNOSIS — J9601 Acute respiratory failure with hypoxia: Secondary | ICD-10-CM

## 2023-10-31 DIAGNOSIS — Z79899 Other long term (current) drug therapy: Secondary | ICD-10-CM | POA: Diagnosis not present

## 2023-10-31 DIAGNOSIS — D630 Anemia in neoplastic disease: Secondary | ICD-10-CM | POA: Diagnosis present

## 2023-10-31 DIAGNOSIS — J441 Chronic obstructive pulmonary disease with (acute) exacerbation: Secondary | ICD-10-CM | POA: Diagnosis present

## 2023-10-31 DIAGNOSIS — J9811 Atelectasis: Secondary | ICD-10-CM | POA: Diagnosis not present

## 2023-10-31 DIAGNOSIS — J701 Chronic and other pulmonary manifestations due to radiation: Secondary | ICD-10-CM | POA: Diagnosis present

## 2023-10-31 DIAGNOSIS — Z87891 Personal history of nicotine dependence: Secondary | ICD-10-CM | POA: Diagnosis not present

## 2023-10-31 LAB — BASIC METABOLIC PANEL
Anion gap: 14 (ref 5–15)
BUN: 18 mg/dL (ref 8–23)
CO2: 20 mmol/L — ABNORMAL LOW (ref 22–32)
Calcium: 9.1 mg/dL (ref 8.9–10.3)
Chloride: 101 mmol/L (ref 98–111)
Creatinine, Ser: 0.95 mg/dL (ref 0.61–1.24)
GFR, Estimated: >60 mL/min (ref >60)
Glucose, Bld: 142 mg/dL — ABNORMAL HIGH (ref 70–99)
Potassium: 4.4 mmol/L (ref 3.5–5.1)
Sodium: 135 mmol/L (ref 135–145)

## 2023-10-31 LAB — GLUCOSE, PLEURAL OR PERITONEAL FLUID: Glucose, Fluid: 130 mg/dL

## 2023-10-31 LAB — CBC WITH DIFFERENTIAL/PLATELET
Abs Immature Granulocytes: 0.24 10*3/uL — ABNORMAL HIGH (ref 0.00–0.07)
Basophils Absolute: 0 10*3/uL (ref 0.0–0.1)
Basophils Relative: 0 %
Eosinophils Absolute: 0 10*3/uL (ref 0.0–0.5)
Eosinophils Relative: 0 %
HCT: 41.1 % (ref 39.0–52.0)
Hemoglobin: 12.8 g/dL — ABNORMAL LOW (ref 13.0–17.0)
Immature Granulocytes: 2 %
Lymphocytes Relative: 4 %
Lymphs Abs: 0.5 10*3/uL — ABNORMAL LOW (ref 0.7–4.0)
MCH: 31.1 pg (ref 26.0–34.0)
MCHC: 31.1 g/dL (ref 30.0–36.0)
MCV: 99.8 fL (ref 80.0–100.0)
Monocytes Absolute: 0.3 10*3/uL (ref 0.1–1.0)
Monocytes Relative: 3 %
Neutro Abs: 9.6 10*3/uL — ABNORMAL HIGH (ref 1.7–7.7)
Neutrophils Relative %: 91 %
Platelets: 329 10*3/uL (ref 150–400)
RBC: 4.12 MIL/uL — ABNORMAL LOW (ref 4.22–5.81)
RDW: 12.7 % (ref 11.5–15.5)
WBC: 10.5 10*3/uL (ref 4.0–10.5)
nRBC: 0 % (ref 0.0–0.2)

## 2023-10-31 LAB — BODY FLUID CELL COUNT WITH DIFFERENTIAL
Eos, Fluid: 1 %
Lymphs, Fluid: 29 %
Monocyte-Macrophage-Serous Fluid: 21 % — ABNORMAL LOW (ref 50–90)
Neutrophil Count, Fluid: 49 % — ABNORMAL HIGH (ref 0–25)
Total Nucleated Cell Count, Fluid: 1479 uL — ABNORMAL HIGH (ref 0–1000)

## 2023-10-31 LAB — ALBUMIN, PLEURAL OR PERITONEAL FLUID: Albumin, Fluid: 1.8 g/dL

## 2023-10-31 LAB — GRAM STAIN

## 2023-10-31 LAB — HEPARIN LEVEL (UNFRACTIONATED)
Heparin Unfractionated: 0.27 [IU]/mL — ABNORMAL LOW (ref 0.30–0.70)
Heparin Unfractionated: 0.29 [IU]/mL — ABNORMAL LOW (ref 0.30–0.70)

## 2023-10-31 LAB — PROCALCITONIN: Procalcitonin: <0.1 ng/mL

## 2023-10-31 LAB — TSH: TSH: 1.097 u[IU]/mL (ref 0.350–4.500)

## 2023-10-31 MED ORDER — HEPARIN BOLUS VIA INFUSION
4000.0000 [IU] | Freq: Once | INTRAVENOUS | Status: AC
  Administered 2023-10-31: 4000 [IU] via INTRAVENOUS

## 2023-10-31 MED ORDER — LEVOTHYROXINE SODIUM 25 MCG PO TABS
125.0000 ug | ORAL_TABLET | Freq: Every day | ORAL | Status: DC
  Administered 2023-10-31 – 2023-11-02 (×3): 125 ug via ORAL
  Filled 2023-10-31 (×3): qty 1

## 2023-10-31 MED ORDER — ACETAMINOPHEN 325 MG PO TABS: 650.0000 mg | ORAL_TABLET | Freq: Four times a day (QID) | ORAL | Status: DC | PRN

## 2023-10-31 MED ORDER — FINASTERIDE 5 MG PO TABS
5.0000 mg | ORAL_TABLET | Freq: Every day | ORAL | Status: DC
  Administered 2023-10-31 – 2023-11-02 (×3): 5 mg via ORAL
  Filled 2023-10-31 (×4): qty 1

## 2023-10-31 MED ORDER — MELATONIN 3 MG PO TABS: 3.0000 mg | ORAL_TABLET | Freq: Every evening | ORAL | Status: DC | PRN

## 2023-10-31 MED ORDER — METHYLPREDNISOLONE SODIUM SUCC 40 MG IJ SOLR
40.0000 mg | Freq: Two times a day (BID) | INTRAMUSCULAR | Status: DC
  Administered 2023-10-31 – 2023-11-02 (×5): 40 mg via INTRAVENOUS
  Filled 2023-10-31 (×5): qty 1

## 2023-10-31 MED ORDER — LIDOCAINE HCL 1 % IJ SOLN
INTRAMUSCULAR | Status: AC
Start: 2023-10-31 — End: ?
  Filled 2023-10-31: qty 20

## 2023-10-31 MED ORDER — SODIUM CHLORIDE 0.9 % IV SOLN
1.0000 g | INTRAVENOUS | Status: DC
  Administered 2023-10-31 – 2023-11-01 (×2): 1 g via INTRAVENOUS
  Filled 2023-10-31 (×2): qty 10

## 2023-10-31 MED ORDER — ONDANSETRON HCL 4 MG PO TABS: 4.0000 mg | ORAL_TABLET | Freq: Four times a day (QID) | ORAL | Status: DC | PRN

## 2023-10-31 MED ORDER — CITALOPRAM HYDROBROMIDE 20 MG PO TABS
20.0000 mg | ORAL_TABLET | Freq: Every day | ORAL | Status: DC
  Administered 2023-10-31 – 2023-11-02 (×3): 20 mg via ORAL
  Filled 2023-10-31 (×4): qty 1

## 2023-10-31 MED ORDER — ONDANSETRON HCL 4 MG/2ML IJ SOLN: 4.0000 mg | Freq: Four times a day (QID) | INTRAMUSCULAR | Status: DC | PRN

## 2023-10-31 MED ORDER — ACETAMINOPHEN 650 MG RE SUPP: 650.0000 mg | Freq: Four times a day (QID) | RECTAL | Status: DC | PRN

## 2023-10-31 MED ORDER — HEPARIN (PORCINE) 25000 UT/250ML-% IV SOLN
1550.0000 [IU]/h | INTRAVENOUS | Status: AC
  Administered 2023-10-31: 1400 [IU]/h via INTRAVENOUS
  Administered 2023-10-31: 1250 [IU]/h via INTRAVENOUS
  Administered 2023-11-01: 1400 [IU]/h via INTRAVENOUS
  Filled 2023-10-31 (×3): qty 250

## 2023-10-31 MED ORDER — SODIUM CHLORIDE 0.9 % IV SOLN
500.0000 mg | INTRAVENOUS | Status: DC
  Administered 2023-10-31: 500 mg via INTRAVENOUS
  Filled 2023-10-31: qty 5

## 2023-10-31 NOTE — Progress Notes (Signed)
 PHARMACY - ANTICOAGULATION CONSULT NOTE  Pharmacy Consult for heparin  Indication: atrial fibrillation  Allergies  Allergen Reactions   Breztri Aerosphere [Budeson-Glycopyrrol-Formoterol]     Bad weezing    Spiractone [Spironolactone]     Facial nerve pain    Patient Measurements: Height: 5\' 9"  (175.3 cm) Weight: 92.8 kg (204 lb 9.4 oz) IBW/kg (Calculated) : 70.7 Heparin Dosing Weight: 91.8 kg  Vital Signs: Temp: 98.1 F (36.7 C) (02/25 1538) Temp Source: Oral (02/25 1538) BP: 135/93 (02/25 1538) Pulse Rate: 73 (02/25 1538)  Labs: Recent Labs    10/30/23 1849 10/30/23 1949 10/30/23 2125 10/31/23 0733 10/31/23 1751  HGB 13.6  --   --  12.8*  --   HCT 41.7  --   --  41.1  --   PLT 365  --   --  329  --   HEPARINUNFRC  --   --   --  0.27* 0.29*  CREATININE 1.03  --   --  0.95  --   TROPONINIHS  --  10 11  --   --     Estimated Creatinine Clearance: 75.5 mL/min (by C-G formula based on SCr of 0.95 mg/dL).   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 year old male admitted on 10/30/2023 with shortness of breath and concern for afib. No anticoagulation prior to admission. Pharmacy consulted to dose heparin.  10/31/2023  -Heparin level 0.29 - slightly subtherapeutic on heparin at 1400 units/hr -Thoracentesis performed today, unclear if heparin was held during that time; no report per RN but heparin volume looked relatively unchanged in bag (although would suspect level to likely be much lower if heparin was held for any significant length of time) -CBC stable -No infusion related issues or bleeding per RN  Goal of Therapy:  Heparin level 0.3-0.7 units/ml Monitor  platelets by anticoagulation protocol: Yes   Plan:  -Given heparin level only slightly subtherapeutic and maybe some pause in infusion, will continue heparin infusion at 1400 units/hr -Recheck 8 heparin level -Daily heparin level, CBC, and monitoring for bleeding -F/u plans for anticoagulation   Thank you for allowing pharmacy to participate in this patient's care.  Pricilla Riffle, PharmD, BCPS Clinical Pharmacist 10/31/2023 6:39 PM

## 2023-10-31 NOTE — H&P (Signed)
 History and Physical    Patient: Christopher Peck:[id number] DOB: Feb 08, 1946 DOA: 10/30/2023 DOS: the patient was seen and examined on 10/31/2023 PCP: Lucky Cowboy, MD  Patient coming from: Home  Chief Complaint:  Chief Complaint  Patient presents with   Shortness of Breath   HPI: Christopher Peck is a 78 y.o. 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 He was using his home MDI/neb beta agonist.  He denied fever, chills, rhinorrhea, sore throat, wheezing or hemoptysis. No chest pain, palpitations, diaphoresis, PND, orthopnea or pitting edema of the lower extremities. No abdominal pain, nausea, emesis, diarrhea, constipation, melena or hematochezia. No flank pain, dysuria, frequency or hematuria.  No polyuria, polydipsia, polyphagia or blurred vision.   Lab work: CBC showed a white count 11.3, hemoglobin 13.6 g/dL platelets 440.  Troponin x 2 normal and BNP 237.5 pg/mL.  Magnesium 1.9 mg/dL.  TSH is pending.  BMP was normal with a potassium level of 4.1 mmol/L, except for a glucose of 104 mg/dL.  Imaging: Portable 1 view chest radiograph showing new moderate size left pleural effusion unable to exclude atelectasis or airspace disease.  Chronic opacity in the left suprahilar area with volume loss again noted.  CTA chest with no evidence of PE.  Normal caliber aorta with scattered atherosclerosis.  Scattered coronary artery calcifications.  Moderate pleural thickening immediately involving  the left hemithorax may be progressive radiation fibrosis.  New simple fluid left pleural fluid collection with associated volume loss in the left hemithorax and dense radiation changes involving the paramediastinal lung tissue.  Severe underlying lung disease with emphysema and respiratory bronchiolitis.  Mildly enlarged mediastinal and hilar lymph nodes.  These are likely reactive.   ED course: Initial vital signs were temperature 98.3 F, pulse time 30, respiration 24, BP 151/103 mmHg O2 sat 96% on room air.  The patient received methylprednisolone 125 mg IVP, magnesium sulfate 2 g IVPB, Xopenex nebs and was started on a heparin infusion.  Review of Systems: As mentioned in the history of present illness. All other systems reviewed and are negative. Past Medical History:  Diagnosis Date   Emphysema of lung (HCC)    History of bilateral inguinal hernia repair 05/11/2009   Hypertension    Past Surgical History:  Procedure Laterality Date   AORTIC ARCH ANGIOGRAPHY N/A 12/09/2020   Procedure: AORTIC ARCH ANGIOGRAPHY;  Surgeon: Leonie Douglas, MD;  Location: [facility];  Service: Cardiovascular;  Laterality: N/A;   CATARACT EXTRACTION, BILATERAL     08/21, 09/21   HERNIA REPAIR     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   Procedure: PERIPHERAL VASCULAR INTERVENTION;  Surgeon: Leonie Douglas, MD;  Location: [facility];  Service: Cardiovascular;  Laterality: Left;  LCCA   Social History:  reports that he quit smoking about 19 years ago. His smoking use included cigarettes. He has never used smokeless tobacco. He reports current alcohol use of about 3.0 standard drinks of alcohol per week. He reports that he does not use drugs.  Allergies  Allergen Reactions   Breztri Aerosphere [Budeson-Glycopyrrol-Formoterol]  Bad weezing    Spiractone [Spironolactone]     Facial nerve pain    Family History  Problem Relation Age of Onset   COPD Father    Kidney failure  Father     Prior to Admission medications   Medication Sig Start Date End Date Taking? Authorizing Provider  albuterol (ACCUNEB) 1.25 MG/3ML nebulizer solution USE 1 VIAL VIA NEBULIZER EVERY 6 HOURS AS NEEDED FOR WHEEZING 10/04/23   Cranford, Archie Patten, NP  albuterol (VENTOLIN HFA) 108 (90 Base) MCG/ACT inhaler USE 2 INHALATIONS 15 MINUTES APART EVERY 4 HOURS AS NEEDED TO RESCUE ASTHMA ATTACK 04/17/23   Raynelle Dick, NP  citalopram (CELEXA) 20 MG tablet TAKE 1 TABLET BY MOUTH EVERY DAY FOR MOOD 03/01/23   Raynelle Dick, NP  finasteride (PROSCAR) 5 MG tablet TAKE 1 TABLET (5 MG TOTAL) BY MOUTH DAILY. 09/18/23 03/16/24  Raynelle Dick, NP  levothyroxine (SYNTHROID) 125 MCG tablet TAKE 1 TABLET DAILY ON AN EMPTY STOMACH WITH WATER FOR 30 MINUTES & NO ANTACID MEDS, CALCIUM OR MAGNESIUM FOR 4 HOURS & AVOID BIOTIN 03/02/23   Raynelle Dick, NP    Physical Exam: Vitals:   10/31/23 0400 10/31/23 0430 10/31/23 0501 10/31/23 0549  BP: (!) 131/96 (!) 143/89  (!) 152/99  Pulse: 88 60  97  Resp: 20 17  (!) 22  Temp:    97.6 F (36.4 C)  TempSrc:    Oral  SpO2: 91% 95% 98% 98%  Weight:    92.8 kg  Height:    5\' 9"  (1.753 m)   Physical Exam Vitals and nursing note reviewed.  Constitutional:      General: He is awake. He is not in acute distress.    Appearance: He is well-developed. He is obese. He is ill-appearing.     Interventions: Nasal cannula in place.  HENT:     Head: Normocephalic.     Nose: No rhinorrhea.     Mouth/Throat:     Mouth: Mucous membranes are moist.  Eyes:     General: No scleral icterus.    Pupils: Pupils are equal, round, and reactive to light.  Neck:     Vascular: No JVD.  Cardiovascular:     Rate and Rhythm: Normal rate. Rhythm irregular.     Heart sounds: S1 normal and S2 normal.  Pulmonary:     Breath sounds: Wheezing present. No rhonchi or rales.  Abdominal:     General: Bowel sounds are normal. There is no distension.     Palpations: Abdomen is soft.      Tenderness: There is no abdominal tenderness. There is no right CVA tenderness or left CVA tenderness.  Musculoskeletal:     Cervical back: Neck supple.     Right lower leg: No edema.     Left lower leg: No edema.  Skin:    General: Skin is warm and dry.  Neurological:     General: No focal deficit present.     Mental Status: He is alert and oriented to person, place, and time.  Psychiatric:        Mood and Affect: Mood normal.        Behavior: Behavior normal. Behavior is cooperative.    Data Reviewed:  Results are pending, will review when available.  EKG: Vent. rate 81 BPM PR interval * ms QRS duration 76 ms QT/QTcB 358/415 ms P-R-T axes * 21 65 Atrial fibrillation Septal infarct (cited on or before 30-Oct-2023) Abnormal ECG.  Assessment  and Plan: Principal Problem:   Acute respiratory failure with hypoxia (HCC) Secondary to exacerbation of:   COPD Superimposed on Pleural effusion on the left Due to radiation therapy secondary to:   Lung cancer (HCC) Admit to telemetry/inpatient. Continue supplemental oxygen. Scheduled and as needed bronchodilators. Will cover with antibiotics pending Gram stain. -Begin ceftriaxone 1 g IVPB daily. -Begin azithromycin 500 mg IVPB daily. Check strep pneumoniae urinary antigen. Check sputum Gram stain, culture and sensitivity. Follow-up blood culture and sensitivity. Follow-up CBC and chemistry in the morning.  Check procalcitonin level in a.m. again.  Active Problems:   CORONARY ATHEROSCLEROSIS by CT scan in 2010 On anticoagulation at the moment. Would benefit from statin    Hyperlipidemia, mixed Follow-up with PCP.    Hypothyroidism Continue levothyroxine 125 mcg p.o. daily.    BPH/Prostatism Continue finasteride 5 mg p.o. daily.    Depression, major, recurrent, in partial remission (HCC) Continue citalopram 20 mg p.o. daily.    Peripheral neuropathy Analgesics as needed.    Paroxysmal atrial fibrillation  (HCC) Initially refused treatment last year. Continue heparin infusion for now. -Transition to DOAC per cardiology. Cardiology consult appreciated.    Normocytic anemia Monitor hematocrit hemoglobin.    Class 1 obesity Current BMI 30.21 kg/m. Would benefit from lifestyle modifications. Follow-up with PCP.     Advance Care Planning:   Code Status: Full Code   Consults: Cardiology Carolan Clines, MD).  Family Communication:   Severity of Illness: The appropriate patient status for this patient is INPATIENT. Inpatient status is judged to be reasonable and necessary in order to provide the required intensity of service to ensure the patient's safety. The patient's presenting symptoms, physical exam findings, and initial radiographic and laboratory data in the context of their chronic comorbidities is felt to place them at high risk for further clinical deterioration. Furthermore, it is not anticipated that the patient will be medically stable for discharge from the hospital within 2 midnights of admission.   * I certify that at the point of admission it is my clinical judgment that the patient will require inpatient hospital care spanning beyond 2 midnights from the point of admission due to high intensity of service, high risk for further deterioration and high frequency of surveillance required.*  Author: Bobette Mo, MD 10/31/2023 7:53 AM  For on call review [email or web address].   This document was prepared using Dragon voice recognition software and may contain some unintended transcription errors.

## 2023-10-31 NOTE — Progress Notes (Addendum)
 PHARMACY - ANTICOAGULATION CONSULT NOTE  Pharmacy Consult for IV heparin  Indication: atrial fibrillation  Allergies  Allergen Reactions   Breztri Aerosphere [Budeson-Glycopyrrol-Formoterol]     Bad weezing    Spiractone [Spironolactone]     Facial nerve pain    Patient Measurements: Weight: 99.8 kg (220 lb) Heparin Dosing Weight: 91.8 kg  Vital Signs: Temp: 98.2 F (36.8 C) (02/24 2345) Temp Source: Oral (02/24 2345) BP: 115/89 (02/24 2345) Pulse Rate: 44 (02/24 2345)  Labs: Recent Labs    10/30/23 1849 10/30/23 1949 10/30/23 2125  HGB 13.6  --   --   HCT 41.7  --   --   PLT 365  --   --   CREATININE 1.03  --   --   TROPONINIHS  --  10 11    Estimated Creatinine Clearance: 72.1 mL/min (by C-G formula based on SCr of 1.03 mg/dL).   Medical History: Past Medical History:  Diagnosis Date   Emphysema of lung (HCC)    History of bilateral inguinal hernia repair 05/11/2009   Hypertension     Assessment: Christopher Peck is a 78 y.o. year old male admitted on 10/30/2023 with shortness of breath and concern for afib. No anticoagulation prior to admission. Pharmacy consulted to dose heparin.  Goal of Therapy:  Heparin level 0.3-0.7 units/ml Monitor platelets by anticoagulation protocol: Yes   Plan:  Heparin 4000 units x 1 as bolus followed by heparin infusion at 1250 units/hr 6 heparin level  Daily heparin level, CBC, and monitoring for bleeding F/u plans for anticoagulation   Thank you for allowing pharmacy to participate in this patient's care.  Marja Kays, PharmD Emergency Medicine Clinical Pharmacist 10/31/2023,1:16 AM

## 2023-10-31 NOTE — Consult Note (Signed)
 Cardiology Consultation   Patient ID: Christopher Peck MRN: [id number]; DOB: 26-Jun-1946  Admit date: 10/30/2023 Date of Consult: 10/31/2023  PCP:  Christopher Cowboy, MD   [facility] Providers Cardiologist:  None        Patient Profile:   Christopher Peck is a 78 y.o. 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  History of Present Illness:   Christopher Peck presented to the emergency room in [place] for shortness of breath. Used his home albuterol nebulizer and had no releaf. Had diffuse musculoskeletal pain in shoulders, knees, and hips but denied chest pain. Has had a productive cough for the last week but denies any fever, chills of sick contacts.  Had chemoradiation to the left lung in 2006 that caused the left lung fibrosis. Decided to not do follow up chest x-rays or CT's since 2017. 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 In the emergency room the chest x-ray showed a left pleural effusion, a chronic opacity in the left suprahilar area, EKG showed A-Flutter. He reported having a history of A-Fib but denied being on a blood thinner. Reported having disagreements with a cardiologist in the past and did not seek treatment for the A-fib. Troponin's were normal at 10 and 11 but BNP was elevated at 237.5. BMP today had a slightly elevated glucose but was otherwise normal (Cr 0.95). Hemoglobin  was slightly anemic at 12.8 but CBC was otherwise normal. TSH was normal at 1.097. Respiratory panel was negative IV heparin was started for anticoagulation due to the atrial fibrillation.   On physical exam reported heart was "skipping a beat" in the past but no clear diagnosis of A-fib until was found on an EKG about a year ago during a doctors visit. Did not want to start an anticoagulant as was on medications for the carotid artery stent. Reported drinking a beer every night. Does not think he snores much. Denies chest pain, palpations, and leg swelling. Has had worsening shortness of breath and weakness recently. Has not had blood in the stool or the urine.   Past Cardiac history. Saw Christopher Rotunda MD in 2009 records are not viewable. A CT scan in 2010 showed coronary atherosclerosis  Past Medical History:  Diagnosis Date   Emphysema of lung (HCC)    History of bilateral inguinal hernia repair 05/11/2009   Hypertension     Past Surgical History:  Procedure Laterality Date   AORTIC ARCH ANGIOGRAPHY N/A 12/09/2020   Procedure: AORTIC ARCH ANGIOGRAPHY;  Surgeon: Christopher Douglas, MD;  Location: [facility] INVASIVE CV [facility];  Service: Cardiovascular;  Laterality: N/A;   CATARACT EXTRACTION, BILATERAL     08/21, 09/21   HERNIA REPAIR     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   Procedure: PERIPHERAL VASCULAR INTERVENTION;  Surgeon: Christopher Douglas, MD;  Location: [facility] INVASIVE CV [facility];  Service: Cardiovascular;  Laterality: Left;  LCCA       Inpatient Medications: Scheduled Meds:  albuterol  5 mg Nebulization Once  citalopram  20 mg Oral Daily   finasteride  5 mg Oral Daily   levalbuterol  0.63 mg Nebulization Once   levalbuterol  0.63 mg Nebulization Q8H   levothyroxine  125 mcg Oral Q0600   methylPREDNISolone (SOLU-MEDROL) injection  40 mg Intravenous Q12H   Continuous Infusions:  heparin 1,250 Units/hr (10/31/23 0247)   PRN Meds: albuterol, guaiFENesin, melatonin  Allergies:     Allergies  Allergen Reactions   Breztri Aerosphere [Budeson-Glycopyrrol-Formoterol]     Bad weezing    Spiractone [Spironolactone]     Facial nerve pain    Social History:   Social History   Socioeconomic History   Marital status: Married    Spouse name: Not on file   Number of children: Not on file   Years of education: Not on file   Highest education level: Not on file  Occupational History   Not on file  Tobacco Use   Smoking status: Former    Current packs/day: 0.00    Types: Cigarettes    Quit date: 09/05/2004    Years since quitting: 19.1   Smokeless tobacco: Never  Vaping Use   Vaping status: Never Used  Substance and Sexual Activity   Alcohol use: Yes    Alcohol/week: 3.0 standard drinks of alcohol    Types: 3 Standard drinks or equivalent per week    Comment: none   Drug use: Never   Sexual activity: Not on file  Other Topics Concern   Not on file  Social History Narrative   Right handed   Caffeine use:  1 cup coffee and tea per day   Lives with spouse, Christopher Peck.   Social Drivers of Corporate investment banker Strain: Not on file  Food Insecurity: No Food Insecurity (10/31/2023)   Hunger Vital Sign    Worried About Running Out of Food in the Last Year: Never true    Ran Out of Food in the Last Year: Never true  Transportation Needs: No Transportation Needs (10/31/2023)   PRAPARE - Administrator, Civil Service (Medical): No    Lack of Transportation (Non-Medical): No  Physical Activity: Not on file  Stress: Not on file  Social Connections: Socially Integrated (10/31/2023)   Social Connection and Isolation Panel [NHANES]    Frequency of Communication with Friends and Family: Three times a week    Frequency of Social Gatherings with Friends and Family: Once a week    Attends Religious Services: 1 to 4 times per year    Active Member of [employer] or Organizations: Yes    Attends Engineer, structural: More than 4 times per year    Marital  Status: Married  Catering manager Violence: Not At Risk (10/31/2023)   Humiliation, Afraid, Rape, and Kick questionnaire    Fear of Current or Ex-Partner: No    Emotionally Abused: No    Physically Abused: No    Sexually Abused: No    Family History:    Family History  Problem Relation Age of Onset   COPD Father    Kidney failure Father      ROS:  Please see the history of present illness.   All other ROS reviewed and negative.     Physical Exam/Data:   Vitals:   10/31/23 0400 10/31/23 0430 10/31/23 0501 10/31/23 0549  BP: (!) 131/96 (!) 143/89  (!) 152/99  Pulse: 88 60  97  Resp: 20 17  (!) 22  Temp:    97.6  F (36.4 C)  TempSrc:    Oral  SpO2: 91% 95% 98% 98%  Weight:    92.8 kg  Height:    5\' 9"  (1.753 m)    Intake/Output Summary (Last 24 hours) at 10/31/2023 0750 Last data filed at 10/31/2023 0600 Gross per 24 hour  Intake 207.36 ml  Output 425 ml  Net -217.64 ml      10/31/2023    5:49 AM 10/31/2023    1:00 AM 10/30/2023    6:01 PM  Last 3 Weights  Weight (lbs) 204 lb 9.4 oz 220 lb 0.3 oz 220 lb  Weight (kg) 92.8 kg 99.8 kg 99.791 kg     Body mass index is 30.21 kg/m.  General:  Well nourished, well developed, in no acute distress. Was on room air. HEENT: normal Neck: no JVD Vascular: No carotid bruits; Distal pulses 2+ bilaterally Cardiac:  normal S1, S2; Irregularly irregular rhythm. No murmurs rubs or gallops Lungs:  wheezing was present diffusely. Significant kyphosis was present. Ext: no edema Skin: warm and dry  Neuro:  CNs 2-12 intact, no focal abnormalities noted Psych:  Normal affect   EKG:  The EKG was personally reviewed and demonstrates:  Atrial flutter Telemetry:  Telemetry was personally reviewed and demonstrates:  Atrial flutter with rates mostly in the 80's.   Relevant CV Studies:  Echo pending  Laboratory Data:  High Sensitivity Troponin:   Recent Labs  Lab 10/30/23 1949 10/30/23 2125  TROPONINIHS 10 11      Chemistry Recent Labs  Lab 10/30/23 1849 10/30/23 1949  NA 136  --   K 4.1  --   CL 99  --   CO2 26  --   GLUCOSE 104*  --   BUN 17  --   CREATININE 1.03  --   CALCIUM 9.7  --   MG  --  1.9  GFRNONAA >60  --   ANIONGAP 11  --     No results for input(s): "PROT", "ALBUMIN", "AST", "ALT", "ALKPHOS", "BILITOT" in the last 168 hours. Lipids No results for input(s): "CHOL", "TRIG", "HDL", "LABVLDL", "LDLCALC", "CHOLHDL" in the last 168 hours.  Hematology Recent Labs  Lab 10/30/23 1849  WBC 11.3*  RBC 4.26  HGB 13.6  HCT 41.7  MCV 97.9  MCH 31.9  MCHC 32.6  RDW 12.7  PLT 365   Thyroid No results for input(s): "TSH", "FREET4" in the last 168 hours.  BNP Recent Labs  Lab 10/30/23 1949  BNP 237.5*    DDimer No results for input(s): "DDIMER" in the last 168 hours.   Radiology/Studies:  CT Angio Chest PE W and/or Wo Contrast Result Date: 10/30/2023 CLINICAL DATA:  Shortness of breath and cough for 3 days. History of lung cancer. EXAM: CT ANGIOGRAPHY CHEST WITH CONTRAST TECHNIQUE: Multidetector CT imaging of the chest was performed using the standard protocol during bolus administration of intravenous contrast. Multiplanar CT image reconstructions and MIPs were obtained to evaluate the vascular anatomy. RADIATION DOSE REDUCTION: This exam was performed according to the departmental dose-optimization program which includes automated exposure control, adjustment of the mA and/or kV according to patient size and/or use of iterative reconstruction technique. CONTRAST:  75mL OMNIPAQUE IOHEXOL 350 MG/ML SOLN COMPARISON:  Chest CT from 2017 FINDINGS: Cardiovascular: The heart is normal in size for age. No pericardial effusion. The aorta is normal in caliber. Scattered atherosclerotic calcifications. No dissection. Scattered coronary artery calcifications. The pulmonary arterial tree is well opacified. No filling defects to suggest pulmonary  embolism. Mediastinum/Nodes: Mildly enlarged  mediastinal and hilar lymph nodes. 15 mm right hilar node previously measured 6 mm. The esophagus is grossly normal. Lungs/Pleura: Moderate pleural thickening medially involving the left hemithorax may be progressive radiation fibrosis. There is a loculated left pleural fluid collection new since the prior chest CT. This measures as simple fluid. Associated loss of volume in the left hemithorax and dense radiation changes involving the paramediastinal lung tissue. No findings suspicious for recurrent tumor. Severe underlying lung disease with emphysema and respiratory bronchiolitis. No worrisome pulmonary nodules to suggest metastatic disease. The central tracheobronchial tree is unremarkable. Upper Abdomen: No significant upper abdominal findings. No hepatic or adrenal gland lesions to suggest metastatic disease. Scattered aortic calcifications. No upper abdominal adenopathy. Musculoskeletal: No significant bony findings. Exaggerated thoracic kyphosis and midthoracic compression deformities. No worrisome bone lesions. Review of the MIP images confirms the above findings. IMPRESSION: 1. No CT findings for pulmonary embolism. 2. Normal caliber thoracic aorta with scattered atherosclerotic calcifications. 3. Scattered coronary artery calcifications. 4. Moderate pleural thickening medially involving the left hemithorax may be progressive radiation fibrosis. There is a loculated left pleural fluid collection new since the prior chest CT. This measures as simple fluid. Associated loss of volume in the left hemithorax and dense radiation changes involving the paramediastinal lung tissue. No findings suspicious for recurrent tumor. 5. Severe underlying lung disease with emphysema and respiratory bronchiolitis. 6. Mildly enlarged mediastinal and hilar lymph nodes. These are likely reactive. Electronically Signed   By: Rudie Meyer M.D.   On: 10/30/2023 22:00   DG Chest Port 1 View Result Date: 10/30/2023 CLINICAL DATA:   Atrial fibrillation with cough and wheezing. EXAM: PORTABLE CHEST 1 VIEW COMPARISON:  Chest x-ray 11/25/2020 FINDINGS: There is a moderate-sized left pleural effusion. Chronic opacity in the left suprahilar area with volume loss again noted. Right lung is clear. Cardiomediastinal silhouette is stable and within normal limits. No acute fractures are seen. IMPRESSION: 1. New moderate-sized left pleural effusion. Can not exclude underlying atelectasis/airspace disease. 2. Chronic opacity in the left suprahilar area with volume loss again noted. Electronically Signed   By: Darliss Cheney M.D.   On: 10/30/2023 20:55     Assessment and Plan:   Atrial flutter CHADS2VASC 5(2 age, HTN, CVA hx) Patient reports prior palpitations many years ago, "skipped beats" but no official arrhythmia diagnosis. However, was in A-fib during doctors visit about a year ago, declined OAC at that time. Now admitted with dyspnea, found to be in afib. Rate since hospitalization, ventricular rate appears to be in the 70's, 80's and 90's - Does not appear to need rate control as V rates are less than 100 and currently not on any medications for rate control. - shortness of breath does not appear to be secondary to atrial fibrillation. - Was started on IV heparin. - will consider DOAC tomorrow after ultrasound guided thoracentesis.  Shortness of breath/ acute COPD exacerbation - Reported taking albuterol nebulizer at home - Will take into consideration when treating Atrial fibrillation (selective beta blocker).  - Viral panel was negative - 94% on room air on exam. -- BNP was elevated at 237.5. Does not have lower extremity edema, no obvious acute CHF. Will follow echo to further assess CHF. - Pulmonary treatment per primary.   Coronary Artery disease - Was initially found in 2010 on a CT.  - Scattered coronary calcifications were present on pulmonary CTA on 10/30/23. - High sensitivity troponin's are negative 10 >  11.  Hypothyroidism - TSH was normal at 1.097 - Continue home levothyroxine 125 MCG per primary team  Hyperlipidemia - LDL on 07/03/23 was 90 - Reported statin intolerance. - Consider Zetia with LDL goal of less than 70  Hypertension - Was previously diagnosed with hypertension. Does not appear to take any medications for this at home.  - some blood pressures appear to be elevated while others are normal. 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  Risk Assessment/Risk Scores:          CHA2DS2-VASc Score =   5  This indicates a  % annual risk of stroke. The patient's score is based upon:           For questions or updates, please contact [facility] Please consult [email or web address] for contact info under    Signed, Arabella Merles, PA-C  10/31/2023 7:50 AM

## 2023-10-31 NOTE — Procedures (Signed)
 Ultrasound-guided diagnostic and therapeutic left thoracentesis performed yielding 180 cc of dark,bloody fluid. No immediate complications. Follow-up chest x-ray pending. The fluid was sent to the lab for preordered studies. The pleural fluid collection was SMALL on [place].  EBL none.

## 2023-10-31 NOTE — ED Notes (Signed)
 Up to use urinal, more dyspneic with exertion. Audible wheezing noted.

## 2023-10-31 NOTE — Progress Notes (Signed)
 PHARMACY - ANTICOAGULATION CONSULT NOTE  Pharmacy Consult for IV heparin  Indication: atrial fibrillation  Allergies  Allergen Reactions   Breztri Aerosphere [Budeson-Glycopyrrol-Formoterol]     Bad weezing    Spiractone [Spironolactone]     Facial nerve pain    Patient Measurements: Height: 5\' 9"  (175.3 cm) Weight: 92.8 kg (204 lb 9.4 oz) IBW/kg (Calculated) : 70.7 Heparin Dosing Weight: 91.8 kg  Vital Signs: Temp: 97.6 F (36.4 C) (02/25 0549) Temp Source: Oral (02/25 0549) BP: 152/99 (02/25 0549) Pulse Rate: 97 (02/25 0549)  Labs: Recent Labs    10/30/23 1849 10/30/23 1949 10/30/23 2125 10/31/23 0733  HGB 13.6  --   --  12.8*  HCT 41.7  --   --  41.1  PLT 365  --   --  329  HEPARINUNFRC  --   --   --  0.27*  CREATININE 1.03  --   --  0.95  TROPONINIHS  --  10 11  --     Estimated Creatinine Clearance: 75.5 mL/min (by C-G formula based on SCr of 0.95 mg/dL).   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 year old male admitted on 10/30/2023 with shortness of breath and concern for afib. No anticoagulation prior to admission. Pharmacy consulted to dose heparin.  10/31/2023  First heparin level 0.27- slightly below goal after 4000 unit bolus & drip at 1250 units/hr CBC stable No infusion related issues or bleeding per RN  Goal of Therapy:  Heparin level 0.3-0.7 units/ml Monitor platelets by anticoagulation protocol: Yes   Plan:  Increase heparin infusion to 1400 units/hr Check 8 heparin level after rate increase Daily heparin level, CBC, and monitoring for bleeding F/u plans for anticoagulation   Thank you for allowing pharmacy to participate in this  patient's care.  Herby Abraham, Pharm.D Use secure chat for questions 10/31/2023 9:21 AM

## 2023-10-31 NOTE — Plan of Care (Signed)

## 2023-11-01 ENCOUNTER — Inpatient Hospital Stay (HOSPITAL_COMMUNITY): Payer: Medicare Other

## 2023-11-01 DIAGNOSIS — I4891 Unspecified atrial fibrillation: Secondary | ICD-10-CM

## 2023-11-01 DIAGNOSIS — J9601 Acute respiratory failure with hypoxia: Secondary | ICD-10-CM | POA: Diagnosis not present

## 2023-11-01 LAB — COMPREHENSIVE METABOLIC PANEL
ALT: 19 U/L (ref 0–44)
AST: 22 U/L (ref 15–41)
Albumin: 2.9 g/dL — ABNORMAL LOW (ref 3.5–5.0)
Alkaline Phosphatase: 70 U/L (ref 38–126)
Anion gap: 9 (ref 5–15)
BUN: 27 mg/dL — ABNORMAL HIGH (ref 8–23)
CO2: 24 mmol/L (ref 22–32)
Calcium: 8.8 mg/dL — ABNORMAL LOW (ref 8.9–10.3)
Chloride: 104 mmol/L (ref 98–111)
Creatinine, Ser: 0.81 mg/dL (ref 0.61–1.24)
GFR, Estimated: >60 mL/min (ref >60)
Glucose, Bld: 144 mg/dL — ABNORMAL HIGH (ref 70–99)
Potassium: 4.5 mmol/L (ref 3.5–5.1)
Sodium: 137 mmol/L (ref 135–145)
Total Bilirubin: 0.5 mg/dL (ref 0.0–1.2)
Total Protein: 6.8 g/dL (ref 6.5–8.1)

## 2023-11-01 LAB — CBC
HCT: 39.3 % (ref 39.0–52.0)
Hemoglobin: 12.6 g/dL — ABNORMAL LOW (ref 13.0–17.0)
MCH: 31.8 pg (ref 26.0–34.0)
MCHC: 32.1 g/dL (ref 30.0–36.0)
MCV: 99.2 fL (ref 80.0–100.0)
Platelets: 328 10*3/uL (ref 150–400)
RBC: 3.96 MIL/uL — ABNORMAL LOW (ref 4.22–5.81)
RDW: 12.6 % (ref 11.5–15.5)
WBC: 18.5 10*3/uL — ABNORMAL HIGH (ref 4.0–10.5)
nRBC: 0 % (ref 0.0–0.2)

## 2023-11-01 LAB — ECHOCARDIOGRAM COMPLETE
Area-P 1/2: 3.99 cm2
Calc EF: 55.5 %
Height: 69 in
S' Lateral: 2.3 cm
Single Plane A2C EF: 57.7 %
Single Plane A4C EF: 54.6 %
Weight: 3273.39 [oz_av]

## 2023-11-01 LAB — HEPARIN LEVEL (UNFRACTIONATED)
Heparin Unfractionated: 0.35 [IU]/mL (ref 0.30–0.70)
Heparin Unfractionated: 0.4 [IU]/mL (ref 0.30–0.70)

## 2023-11-01 LAB — STREP PNEUMONIAE URINARY ANTIGEN: Strep Pneumo Urinary Antigen: NEGATIVE

## 2023-11-01 MED ORDER — IPRATROPIUM-ALBUTEROL 0.5-2.5 (3) MG/3ML IN SOLN
3.0000 mL | Freq: Four times a day (QID) | RESPIRATORY_TRACT | Status: DC
  Administered 2023-11-01: 3 mL via RESPIRATORY_TRACT
  Filled 2023-11-01: qty 3

## 2023-11-01 MED ORDER — PERFLUTREN LIPID MICROSPHERE
1.0000 mL | INTRAVENOUS | Status: AC | PRN
  Administered 2023-11-01: 1 mL via INTRAVENOUS

## 2023-11-01 MED ORDER — AZITHROMYCIN 250 MG PO TABS
500.0000 mg | ORAL_TABLET | Freq: Every day | ORAL | Status: DC
  Administered 2023-11-01: 500 mg via ORAL
  Filled 2023-11-01: qty 2

## 2023-11-01 MED ORDER — IPRATROPIUM-ALBUTEROL 0.5-2.5 (3) MG/3ML IN SOLN
3.0000 mL | Freq: Two times a day (BID) | RESPIRATORY_TRACT | Status: DC
  Administered 2023-11-01 – 2023-11-02 (×2): 3 mL via RESPIRATORY_TRACT
  Filled 2023-11-01 (×2): qty 3

## 2023-11-01 NOTE — Plan of Care (Signed)
 Christopher Peck's A-fib remains rate controlled.  He can continue on heparin until he is able to convert to oral anticoagulation.  Can plan for Eliquis 5 mg twice daily.  I will follow-up his echocardiogram.  If this is unremarkable , I have no anticipated changes from cardiology.  We will follow peripherally and if no other issues cardiology will sign off. We can work on [employer].

## 2023-11-01 NOTE — Progress Notes (Signed)
 PROGRESS NOTE    Christopher Peck  WUJ:[id number] DOB: [birth year] DOA: 10/30/2023 PCP: Lucky Cowboy, MD    Brief Narrative:   Christopher Peck is a 78 y.o. 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  Patient denies fever, no chills, no sore throat, no chest pain, no palpitations, no edema of his lower extremities, no abdominal pain, no nausea/vomiting/diarrhea, no urinary symptoms.  In the ED, temperature 98.3 F, HR 130, RR 24, BP 151/103, SpO2 96% on 2 L nasal cannula.  WBC 11.3, hemoglobin 13.6, platelet count 365.  Sodium 136, potassium 4.1, chloride 99, CO2 26, glucose 104, BUN 17, creat 1.03.  High sensitive troponin 10> 11.  BNP 237.5.  COVID/influenza/RSV PCR negative.  Chest x-ray with new moderate size left pleural effusion, chronic opacity left suprahilar area with volume loss. Patient received methylprednisolone 125 mg IVP, magnesium sulfate 2 g IVPB, Xopenex nebs and was started on a heparin infusion.  TRH consulted for admission.  Assessment & Plan:   Acute respiratory failure The patient presenting with progressive shortness of breath, wheezing over the past 3 days.  Associated with wheezing.   Imaging notable for emphysema.  Afebrile with elevated WBC count 11.3. Etiology likely multifactorial with pleural effusion, COPD exacerbation and community-acquired pneumonia. -- Continue supplemental oxygen, maintain SpO2 greater than 99%; currently on 2 L nasal count with SpO2 97% at rest -- Further treatment as below  Left pleural effusion Imaging notable for left pleural effusion.  IR was consulted and patient underwent thoracentesis on 10/31/2023 with 180 mL fluid removed. -- Pleural fluid Gram stain with  no organisms  COPD exacerbation -- DuoNeb twice daily -- Solu-Medrol 40 mg IV every 12 hours -- Wean off supplemental oxygen, O2 walk screen  Community-acquired pneumonia -- Azithromycin 500 mg p.o. daily x 5 days -- Ceftriaxone 1 g IV every 24 hours x 5 days  Paroxysmal atrial fibrillation/atrial flutter Patient with history of palpitations, was apparently in atrial fibrillation during physicians visit about a year ago, declined oral anticoagulant at that time.  Currently not on any rate controlling medications. -- Cardiology following, appreciate assistance -- TTE: LVEF 60 to 65%, no LV RWMA, RV systolic function normal, biatrial enlargement, IVC normal in size. -- Heparin drip, anticipate transition to Eliquis tomorrow  Essential hypertension BP 142/91, currently not on antihypertensives outpatient. -- Continue monitor BP closely  Hypothyroidism -- Levothyroxine 125 mcg p.o. daily  BPH -- Finasteride 5 mg p.o. daily  Depression -- Citalopram 20 mg p.o. daily  Obesity, class I Body mass index is 30.21 kg/m.    DVT prophylaxis: Heparin drip    Code Status: Full Code Family Communication: No family present at bedside  Disposition Plan:  Level of care: Telemetry Status is: Inpatient Remains inpatient appropriate because: Heparin drip, anticipate transition to Eliquis tomorrow and likely discharge home    Consultants:  Cardiology Interventional radiology  Procedures:  Thoracentesis TTE  Antimicrobials:  Azithromycin 2/25>> Ceftriaxone 2/25>>   Subjective: Patient seen examined bedside, lying in bed.  Dyspnea improved.  Weaned off of supplemental oxygen.  Remains on heparin drip.  Awaiting TTE.  Cardiology following.  Anticipate transition to Eliquis tomorrow.  Remains on IV antibiotics, IV steroids.  No other specific questions, concerns or complaints at this time.  Denies headache, no dizziness, no chest pain, no palpitations, no current shortness of breath,  no abdominal pain, no fever/chills/night sweats, no nausea/vomiting/diarrhea, no focal weakness, no fatigue, no paresthesia.  No acute events overnight per nursing staff.  Objective: Vitals:   10/31/23 2004 11/01/23 0320 11/01/23 1214 11/01/23 1356  BP: 130/87 (!) 142/91 127/81   Pulse: 72 64 (!) 43   Resp: 17 20 18    Temp: (!) 97.3 F (36.3 C) (!) 97.4 F (36.3 C) 97.7 F (36.5 C)   TempSrc: Oral Oral Oral   SpO2: 97% 97% 96% 97%  Weight:      Height:        Intake/Output Summary (Last 24 hours) at 11/01/2023 1754 Last data filed at 11/01/2023 1401 Gross per 24 hour  Intake 1478.87 ml  Output 1175 ml  Net 303.87 ml   Filed Weights   10/30/23 1801 10/31/23 0100 10/31/23 0549  Weight: 99.8 kg 99.8 kg 92.8 kg    Examination:  Physical Exam: GEN: NAD, alert and oriented x 3, obese HEENT: NCAT, PERRL, EOMI, sclera clear, MMM PULM: Sounds slight diminished bilateral bases, mild late expiratory wheezing, normal respiratory effort without accessory muscle use, on 2 L nasal cannula with SpO2 97% at rest CV: RRR w/o M/G/R GI: abd soft, NTND, + BS MSK: no peripheral edema, muscle strength globally intact 5/5 bilateral upper/lower extremities NEURO: CN II-XII intact, no focal deficits, sensation to light touch intact PSYCH: normal mood/affect Integumentary: dry/intact, no rashes or wounds    Data Reviewed: I have personally reviewed following labs and imaging studies  CBC: Recent Labs  Lab 10/30/23 1849 10/31/23 0733 11/01/23 0405  WBC 11.3* 10.5 18.5*  NEUTROABS  --  9.6*  --   HGB 13.6 12.8* 12.6*  HCT 41.7 41.1 39.3  MCV 97.9 99.8 99.2  PLT 365 329 328   Basic Metabolic Panel: Recent Labs  Lab 10/30/23 1849 10/30/23 1949 10/31/23 0733 11/01/23 0405  NA 136  --  135 137  K 4.1  --  4.4 4.5  CL 99  --  101 104  CO2 26  --  20* 24  GLUCOSE 104*  --  142* 144*  BUN 17  --  18 27*  CREATININE 1.03  --  0.95 0.81  CALCIUM 9.7  --  9.1 8.8*  MG  --  1.9  --    --    GFR: Estimated Creatinine Clearance: 88.6 mL/min (by C-G formula based on SCr of 0.81 mg/dL). Liver Function Tests: Recent Labs  Lab 11/01/23 0405  AST 22  ALT 19  ALKPHOS 70  BILITOT 0.5  PROT 6.8  ALBUMIN 2.9*   No results for input(s): "LIPASE", "AMYLASE" in the last 168 hours. No results for input(s): "AMMONIA" in the last 168 hours. Coagulation Profile: No results for input(s): "INR", "PROTIME" in the last 168 hours. Cardiac Enzymes: No results for input(s): "CKTOTAL", "CKMB", "CKMBINDEX", "TROPONINI" in the last 168 hours. BNP (last 3 results) No results for input(s): "PROBNP" in the last 8760 hours. HbA1C: No results for input(s): "HGBA1C" in the last 72 hours. CBG: No results for input(s): "GLUCAP" in the last 168 hours. Lipid Profile: No results for input(s): "CHOL", "HDL", "LDLCALC", "TRIG", "CHOLHDL", "LDLDIRECT" in the last 72 hours. Thyroid Function Tests: Recent Labs    10/31/23 0733  TSH 1.097   Anemia Panel: No results for input(s): "VITAMINB12", "FOLATE", "FERRITIN", "TIBC", "IRON", "RETICCTPCT" in the last 72 hours. Sepsis Labs: Recent Labs  Lab 10/31/23 [phone]  PROCALCITON <0.10    Recent Results (from the past 240 hours)  Resp panel by RT-PCR (RSV, Flu A&B, Covid) Anterior Nasal Swab     Status: None   Collection Time: 10/30/23  7:49 PM   Specimen: Anterior Nasal Swab  Result Value Ref Range Status   SARS Coronavirus 2 by RT PCR NEGATIVE NEGATIVE Final    Comment: (NOTE) SARS-CoV-2 target nucleic acids are NOT DETECTED.  The SARS-CoV-2 RNA is generally detectable in upper respiratory specimens during the acute phase of infection. The lowest concentration of SARS-CoV-2 viral copies this assay can detect is 138 copies/mL. A negative result does not preclude SARS-Cov-2 infection and should not be used as the sole basis for treatment or other patient management decisions. A negative result may occur with  improper specimen  collection/handling, submission of specimen other than nasopharyngeal swab, presence of viral mutation(s) within the areas targeted by this assay, and inadequate number of viral copies(<138 copies/mL). A negative result must be combined with clinical observations, patient history, and epidemiological information. The expected result is Negative.  Fact Sheet for Patients:  BloggerCourse.com  Fact Sheet for Healthcare Providers:  SeriousBroker.it  This test is no t yet approved or cleared by the [place] FDA and  has been authorized for detection and/or diagnosis of SARS-CoV-2 by FDA under an Emergency Use Authorization (EUA). This EUA will remain  in effect (meaning this test can be used) for the duration of the COVID-19 declaration under Section 564(b)(1) of the Act, 21 U.S.C.section 360bbb-3(b)(1), unless the authorization is terminated  or revoked sooner.       Influenza A by PCR NEGATIVE NEGATIVE Final   Influenza B by PCR NEGATIVE NEGATIVE Final    Comment: (NOTE) The Xpert Xpress SARS-CoV-2/FLU/RSV plus assay is intended as an aid in the diagnosis of influenza from Nasopharyngeal swab specimens and should not be used as a sole basis for treatment. Nasal washings and aspirates are unacceptable for Xpert Xpress SARS-CoV-2/FLU/RSV testing.  Fact Sheet for Patients: BloggerCourse.com  Fact Sheet for Healthcare Providers: SeriousBroker.it  This test is not yet approved or cleared by the [place] FDA and has been authorized for detection and/or diagnosis of SARS-CoV-2 by FDA under an Emergency Use Authorization (EUA). This EUA will remain in effect (meaning this test can be used) for the duration of the COVID-19 declaration under Section 564(b)(1) of the Act, 21 U.S.C. section 360bbb-3(b)(1), unless the authorization is terminated or revoked.     Resp Syncytial  Virus by PCR NEGATIVE NEGATIVE Final    Comment: (NOTE) Fact Sheet for Patients: BloggerCourse.com  Fact Sheet for Healthcare Providers: SeriousBroker.it  This test is not yet approved or cleared by the [place] FDA and has been authorized for detection and/or diagnosis of SARS-CoV-2 by FDA under an Emergency Use Authorization (EUA). This EUA will remain in effect (meaning this test can be used) for the duration of the COVID-19 declaration under Section 564(b)(1) of the Act, 21 U.S.C. section 360bbb-3(b)(1), unless the authorization is terminated or revoked.  Performed at [facility], [address], [place], [place] [address]   Culture, blood (Routine X 2) w Reflex to ID Panel     Status: None (Preliminary result)   Collection Time: 10/31/23  7:26 AM   Specimen: BLOOD  Result Value Ref Range Status   Specimen Description   Final    BLOOD SITE NOT SPECIFIED Performed at [facility], [address] [place], [place] [address]    Special  Requests   Final    BOTTLES DRAWN AEROBIC AND ANAEROBIC Blood Culture results may not be optimal due to an inadequate volume of blood received in culture bottles Performed at [facility], [address] [place], [place] [address]    Culture   Final    NO GROWTH < 24 HOURS Performed at [facility], [address] [place], [place] [address]    Report Status PENDING  Incomplete  Culture, blood (Routine X 2) w Reflex to ID Panel     Status: None (Preliminary result)   Collection Time: 10/31/23  7:33 AM   Specimen: BLOOD  Result Value Ref Range Status   Specimen Description   Final    BLOOD SITE NOT SPECIFIED Performed at [facility], [address], [place], [place] [address]    Special Requests   Final    BOTTLES DRAWN AEROBIC AND ANAEROBIC Blood Culture results may not be optimal due to an inadequate  volume of blood received in culture bottles Performed at [facility], [address] [place], [place] [address]    Culture   Final    NO GROWTH < 24 HOURS Performed at [facility], [address], [place], [place] [address]    Report Status PENDING  Incomplete  Gram stain     Status: None   Collection Time: 10/31/23  3:07 PM   Specimen: PATH Cytology Pleural fluid  Result Value Ref Range Status   Specimen Description   Final    PLEURAL Performed at [facility], [address] [place], [place] [address]    Special Requests   Final    NONE Performed at [facility], [address], [place], [place] [address]    Gram Stain   Final    RARE WBC PRESENT,BOTH PMN AND MONONUCLEAR NO ORGANISMS SEEN Performed at [facility], [address] [place], [place] [address]    Report Status 10/31/2023 FINAL  Final         Radiology Studies: ECHOCARDIOGRAM COMPLETE Result Date: 11/01/2023    ECHOCARDIOGRAM REPORT   Patient Name:   Christopher Peck Date of Exam: 11/01/2023 Medical Rec #:  [id number]       Height:       69.0 in Accession #:    [id number]      Weight:       204.6 lb Date of Birth:  Jan 09, 1946       BSA:          2.086 m Patient Age:    77 years        BP:           142/91 mmHg Patient Gender: M               HR:           70 bpm. Exam Location:  Inpatient Procedure: 2D Echo, Cardiac Doppler, Color Doppler and Intracardiac            Opacification Agent (Both Spectral and Color Flow Doppler were            utilized during procedure). Indications:    Atrial Fibrillation I48.91  History:        Patient has no prior history of Echocardiogram examinations.                 Arrythmias:Atrial Fibrillation; Signs/Symptoms:Hypotension.  Sonographer:    Webb Laws Referring Phys:  [id number] DAVID MANUEL ORTIZ IMPRESSIONS  1. Left ventricular ejection fraction, by estimation, is 60 to 65%. The left ventricle has normal  function. The left ventricle has no regional wall motion abnormalities. Left ventricular diastolic function could not be evaluated.  2. Right ventricular systolic function is normal. The right ventricular size is normal.  3. Left atrial size was mildly dilated.  4. Right atrial size was mildly dilated.  5. The mitral valve is normal in structure. No evidence of mitral valve regurgitation. No evidence of mitral stenosis.  6. The aortic valve is tricuspid. There is mild calcification of the aortic valve. Aortic valve regurgitation is not visualized. No aortic stenosis is present.  7. The inferior vena cava is normal in size with greater than 50% respiratory variability, suggesting right atrial pressure of 3 mmHg. FINDINGS  Left Ventricle: Left ventricular ejection fraction, by estimation, is 60 to 65%. The left ventricle has normal function. The left ventricle has no regional wall motion abnormalities. Definity contrast agent was given IV to delineate the left ventricular  endocardial borders. Strain imaging was not performed. The left ventricular internal cavity size was normal in size. There is no left ventricular hypertrophy. Left ventricular diastolic function could not be evaluated due to atrial fibrillation. Left ventricular diastolic function could not be evaluated. Right Ventricle: The right ventricular size is normal. No increase in right ventricular wall thickness. Right ventricular systolic function is normal. Left Atrium: Left atrial size was mildly dilated. Right Atrium: Right atrial size was mildly dilated. Pericardium: There is no evidence of pericardial effusion. Mitral Valve: The mitral valve is normal in structure. No evidence of mitral valve regurgitation. No evidence of mitral valve stenosis. Tricuspid Valve: The tricuspid valve is normal in structure. Tricuspid valve regurgitation is trivial. No evidence of tricuspid stenosis. Aortic Valve: The aortic valve is tricuspid. There is mild  calcification of the aortic valve. Aortic valve regurgitation is not visualized. No aortic stenosis is present. Pulmonic Valve: The pulmonic valve was normal in structure. Pulmonic valve regurgitation is not visualized. No evidence of pulmonic stenosis. Aorta: The aortic root is normal in size and structure. Venous: The inferior vena cava is normal in size with greater than 50% respiratory variability, suggesting right atrial pressure of 3 mmHg. IAS/Shunts: No atrial level shunt detected by color flow Doppler. Additional Comments: 3D imaging was not performed.  LEFT VENTRICLE PLAX 2D LVIDd:         4.00 cm      Diastology LVIDs:         2.30 cm      LV e' medial:    10.80 cm/s LV PW:         0.90 cm      LV E/e' medial:  9.6 LV IVS:        1.10 cm      LV e' lateral:   11.90 cm/s LVOT diam:     2.00 cm      LV E/e' lateral: 8.7 LV SV:         53 LV SV Index:   26 LVOT Area:     3.14 cm  LV Volumes (MOD) LV vol d, MOD A2C: 116.0 ml LV vol d, MOD A4C: 110.0 ml LV vol s, MOD A2C: 49.1 ml LV vol s, MOD A4C: 49.9 ml LV SV MOD A2C:     66.9 ml LV SV MOD A4C:     110.0 ml LV SV MOD BP:      62.8  ml RIGHT VENTRICLE             IVC RV Basal diam:  3.60 cm     IVC diam: 1.60 cm RV S prime:     14.10 cm/s TAPSE (M-mode): 2.1 cm LEFT ATRIUM             Index        RIGHT ATRIUM           Index LA diam:        4.20 cm 2.01 cm/m   RA Area:     17.20 cm LA Vol (A2C):   52.4 ml 25.12 ml/m  RA Volume:   41.00 ml  19.65 ml/m LA Vol (A4C):   58.5 ml 28.04 ml/m LA Biplane Vol: 56.4 ml 27.04 ml/m  AORTIC VALVE LVOT Vmax:   97.63 cm/s LVOT Vmean:  58.700 cm/s LVOT VTI:    0.170 m  AORTA Ao Root diam: 2.80 cm Ao Asc diam:  3.60 cm MITRAL VALVE                TRICUSPID VALVE MV Area (PHT): 3.99 cm     TR Peak grad:   17.3 mmHg MV Decel Time: 190 msec     TR Vmax:        208.00 cm/s MV E velocity: 103.70 cm/s MV A velocity: 47.20 cm/s   SHUNTS MV E/A ratio:  2.20         Systemic VTI:  0.17 m                             Systemic  Diam: 2.00 cm Arvilla Meres MD Electronically signed by Arvilla Meres MD Signature Date/Time: 11/01/2023/9:52:07 AM    Final    DG Chest 1 View Result Date: 10/31/2023 CLINICAL DATA:  Status post thoracentesis. EXAM: CHEST  1 VIEW COMPARISON:  Chest CT dated 10/30/2023. FINDINGS: Small left pleural effusion and left lung base atelectasis. Left suprahilar scarring. No pneumothorax. The cardiac silhouette is within normal limits. No acute osseous pathology. IMPRESSION: Small left pleural effusion and left lung base atelectasis. No pneumothorax. Electronically Signed   By: Elgie Collard M.D.   On: 10/31/2023 16:23   US THORACENTESIS ASP PLEURAL SPACE W/IMG GUIDE Result Date: 10/31/2023 INDICATION: Patient with remote history of lung cancer, COPD, dyspnea, atrial fibrillation, small left pleural effusion. Request received for diagnostic and therapeutic left thoracentesis. EXAM: ULTRASOUND GUIDED DIAGNOSTIC AND THERAPEUTIC LEFT THORACENTESIS MEDICATIONS: 7 ml 1% lidocaine COMPLICATIONS: None immediate. PROCEDURE: An ultrasound guided thoracentesis was thoroughly discussed with the patient and questions answered. The benefits, risks, alternatives and complications were also discussed. The patient understands and wishes to proceed with the procedure. Written consent was obtained. Ultrasound was performed to localize and mark an adequate pocket of fluid in the left chest. The area was then prepped and draped in the normal sterile fashion. 1% Lidocaine was used for local anesthesia. Under ultrasound guidance a 6 Fr Safe-T-Centesis catheter was introduced. Thoracentesis was performed. The catheter was removed and a dressing applied. FINDINGS: A total of approximately 180 cc of dark, bloody fluid was removed. Samples were sent to the laboratory as requested by the clinical team. IMPRESSION: Successful ultrasound guided diagnostic and therapeutic left thoracentesis yielding 180 cc of pleural fluid. Performed by:  Artemio Aly Electronically Signed   By: Marliss Coots M.D.   On: 10/31/2023 16:02   CT Angio Chest PE W and/or Wo Contrast Result  Date: 10/30/2023 CLINICAL DATA:  Shortness of breath and cough for 3 days. History of lung cancer. EXAM: CT ANGIOGRAPHY CHEST WITH CONTRAST TECHNIQUE: Multidetector CT imaging of the chest was performed using the standard protocol during bolus administration of intravenous contrast. Multiplanar CT image reconstructions and MIPs were obtained to evaluate the vascular anatomy. RADIATION DOSE REDUCTION: This exam was performed according to the departmental dose-optimization program which includes automated exposure control, adjustment of the mA and/or kV according to patient size and/or use of iterative reconstruction technique. CONTRAST:  75mL OMNIPAQUE IOHEXOL 350 MG/ML SOLN COMPARISON:  Chest CT from 2017 FINDINGS: Cardiovascular: The heart is normal in size for age. No pericardial effusion. The aorta is normal in caliber. Scattered atherosclerotic calcifications. No dissection. Scattered coronary artery calcifications. The pulmonary arterial tree is well opacified. No filling defects to suggest pulmonary embolism. Mediastinum/Nodes: Mildly enlarged mediastinal and hilar lymph nodes. 15 mm right hilar node previously measured 6 mm. The esophagus is grossly normal. Lungs/Pleura: Moderate pleural thickening medially involving the left hemithorax may be progressive radiation fibrosis. There is a loculated left pleural fluid collection new since the prior chest CT. This measures as simple fluid. Associated loss of volume in the left hemithorax and dense radiation changes involving the paramediastinal lung tissue. No findings suspicious for recurrent tumor. Severe underlying lung disease with emphysema and respiratory bronchiolitis. No worrisome pulmonary nodules to suggest metastatic disease. The central tracheobronchial tree is unremarkable. Upper Abdomen: No significant upper  abdominal findings. No hepatic or adrenal gland lesions to suggest metastatic disease. Scattered aortic calcifications. No upper abdominal adenopathy. Musculoskeletal: No significant bony findings. Exaggerated thoracic kyphosis and midthoracic compression deformities. No worrisome bone lesions. Review of the MIP images confirms the above findings. IMPRESSION: 1. No CT findings for pulmonary embolism. 2. Normal caliber thoracic aorta with scattered atherosclerotic calcifications. 3. Scattered coronary artery calcifications. 4. Moderate pleural thickening medially involving the left hemithorax may be progressive radiation fibrosis. There is a loculated left pleural fluid collection new since the prior chest CT. This measures as simple fluid. Associated loss of volume in the left hemithorax and dense radiation changes involving the paramediastinal lung tissue. No findings suspicious for recurrent tumor. 5. Severe underlying lung disease with emphysema and respiratory bronchiolitis. 6. Mildly enlarged mediastinal and hilar lymph nodes. These are likely reactive. Electronically Signed   By: Rudie Meyer M.D.   On: 10/30/2023 22:00   DG Chest Port 1 View Result Date: 10/30/2023 CLINICAL DATA:  Atrial fibrillation with cough and wheezing. EXAM: PORTABLE CHEST 1 VIEW COMPARISON:  Chest x-ray 11/25/2020 FINDINGS: There is a moderate-sized left pleural effusion. Chronic opacity in the left suprahilar area with volume loss again noted. Right lung is clear. Cardiomediastinal silhouette is stable and within normal limits. No acute fractures are seen. IMPRESSION: 1. New moderate-sized left pleural effusion. Can not exclude underlying atelectasis/airspace disease. 2. Chronic opacity in the left suprahilar area with volume loss again noted. Electronically Signed   By: Darliss Cheney M.D.   On: 10/30/2023 20:55        Scheduled Meds:  azithromycin  500 mg Oral QHS   citalopram  20 mg Oral Daily   finasteride  5 mg Oral  Daily   ipratropium-albuterol  3 mL Nebulization BID   levothyroxine  125 mcg Oral Q0600   methylPREDNISolone (SOLU-MEDROL) injection  40 mg Intravenous Q12H   Continuous Infusions:  cefTRIAXone (ROCEPHIN)  IV Stopped (10/31/23 2142)   heparin 1,400 Units/hr (11/01/23 1350)     LOS: 1  day    Time spent: 53 minutes spent on chart review, discussion with nursing staff, consultants, updating family and interview/physical exam; more than 50% of that time was spent in counseling and/or coordination of care.    Alvira Philips [place], DO [facility] Available via Epic secure chat 7am-7pm After these hours, please refer to coverage provider listed on [email or web address] 11/01/2023, 5:54 PM

## 2023-11-01 NOTE — Progress Notes (Signed)
 PHARMACY - ANTICOAGULATION CONSULT NOTE  Pharmacy Consult for heparin  Indication: atrial fibrillation  Allergies  Allergen Reactions   Breztri Aerosphere [Budeson-Glycopyrrol-Formoterol]     Bad weezing    Spiractone [Spironolactone]     Facial nerve pain    Patient Measurements: Height: 5\' 9"  (175.3 cm) Weight: 92.8 kg (204 lb 9.4 oz) IBW/kg (Calculated) : 70.7 Heparin Dosing Weight: 91.8 kg  Vital Signs: Temp: 97.7 F (36.5 C) (02/26 1214) Temp Source: Oral (02/26 1214) BP: 127/81 (02/26 1214) Pulse Rate: 43 (02/26 1214)  Labs: Recent Labs    10/30/23 1849 10/30/23 1849 10/30/23 1949 10/30/23 2125 10/31/23 0733 10/31/23 1751 11/01/23 0405 11/01/23 1149  HGB 13.6  --   --   --  12.8*  --  12.6*  --   HCT 41.7  --   --   --  41.1  --  39.3  --   PLT 365  --   --   --  329  --  328  --   HEPARINUNFRC  --    < >  --   --  0.27* 0.29* 0.35 0.40  CREATININE 1.03  --   --   --  0.95  --  0.81  --   TROPONINIHS  --   --  10 11  --   --   --   --    < > = values in this interval not displayed.    Estimated Creatinine Clearance: 88.6 mL/min (by C-G formula based on SCr of 0.81 mg/dL).  Assessment: Christopher Peck is a 78 y.o. year old male admitted on 10/30/2023 with shortness of breath and concern for afib. No anticoagulation prior to admission. Pharmacy consulted to dose heparin.  2/25 : thoracentesis  11/01/2023  -Heparin level 0.40 -remains therapeutic on heparin at 1400 units/hr -CBC stable - SCr = 0.81 -No infusion related issues or bleeding reported  Goal of Therapy:  Heparin level 0.3-0.7 units/ml Monitor platelets by anticoagulation protocol: Yes   Plan:  -Continue heparin infusion at 1400 units/hr -Daily heparin level, CBC, and monitoring for bleeding -F/u plans for transition to DOAC  Thank you for allowing pharmacy to participate in this patient's care.   Herby Abraham, Pharm.D Use secure chat for questions 11/01/2023 1:08 PM

## 2023-11-01 NOTE — Progress Notes (Signed)
 PHARMACY - ANTICOAGULATION CONSULT NOTE  Pharmacy Consult for heparin  Indication: atrial fibrillation  Allergies  Allergen Reactions   Breztri Aerosphere [Budeson-Glycopyrrol-Formoterol]     Bad weezing    Spiractone [Spironolactone]     Facial nerve pain    Patient Measurements: Height: 5\' 9"  (175.3 cm) Weight: 92.8 kg (204 lb 9.4 oz) IBW/kg (Calculated) : 70.7 Heparin Dosing Weight: 91.8 kg  Vital Signs: Temp: 97.4 F (36.3 C) (02/26 0320) Temp Source: Oral (02/26 0320) BP: 142/91 (02/26 0320) Pulse Rate: 64 (02/26 0320)  Labs: Recent Labs    10/30/23 1849 10/30/23 1949 10/30/23 2125 10/31/23 0733 10/31/23 1751 11/01/23 0405  HGB 13.6  --   --  12.8*  --  12.6*  HCT 41.7  --   --  41.1  --  39.3  PLT 365  --   --  329  --  328  HEPARINUNFRC  --   --   --  0.27* 0.29* 0.35  CREATININE 1.03  --   --  0.95  --  0.81  TROPONINIHS  --  10 11  --   --   --     Estimated Creatinine Clearance: 88.6 mL/min (by C-G formula based on SCr of 0.81 mg/dL).   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 year old male admitted on 10/30/2023 with shortness of breath and concern for afib. No anticoagulation prior to admission. Pharmacy consulted to dose heparin.  2/25 : thoracentesis  11/01/2023  -Heparin level 0.35 - therapeutic on heparin at 1400 units/hr -CBC stable - SCr = 0.81 -No infusion related issues or bleeding reported  Goal of Therapy:  Heparin level 0.3-0.7 units/ml Monitor platelets by anticoagulation protocol: Yes   Plan:  -Continue heparin infusion at 1400 units/hr -Recheck 8 heparin level to confirm therapeutic dose -Daily heparin level, CBC, and  monitoring for bleeding -F/u plans for anticoagulation   Thank you for allowing pharmacy to participate in this patient's care.  Christopher Peck, PharmD Clinical Pharmacist 11/01/2023 5:33 AM

## 2023-11-01 NOTE — Progress Notes (Signed)
 SATURATION QUALIFICATIONS: (This note is used to comply with regulatory documentation for home oxygen)  Patient Saturations on Room Air at Rest = 96%  Patient Saturations on Room Air while Ambulating = 94%   Patient ambulated to restroom on room air, returned to bed with very labored breathing and associated wheezing. Rechecked o2 at this time-sats 94%. Instructed patient on good breathing technique. O2 on room air remains >90%.

## 2023-11-02 ENCOUNTER — Telehealth: Payer: Self-pay | Admitting: *Deleted

## 2023-11-02 DIAGNOSIS — J9601 Acute respiratory failure with hypoxia: Secondary | ICD-10-CM | POA: Diagnosis not present

## 2023-11-02 LAB — HEPARIN LEVEL (UNFRACTIONATED): Heparin Unfractionated: 0.27 [IU]/mL — ABNORMAL LOW (ref 0.30–0.70)

## 2023-11-02 LAB — CBC
HCT: 40 % (ref 39.0–52.0)
Hemoglobin: 13 g/dL (ref 13.0–17.0)
MCH: 32.3 pg (ref 26.0–34.0)
MCHC: 32.5 g/dL (ref 30.0–36.0)
MCV: 99.5 fL (ref 80.0–100.0)
Platelets: 333 10*3/uL (ref 150–400)
RBC: 4.02 MIL/uL — ABNORMAL LOW (ref 4.22–5.81)
RDW: 12.8 % (ref 11.5–15.5)
WBC: 16.5 10*3/uL — ABNORMAL HIGH (ref 4.0–10.5)
nRBC: 0 % (ref 0.0–0.2)

## 2023-11-02 MED ORDER — AZITHROMYCIN 500 MG PO TABS: 500.0000 mg | ORAL_TABLET | Freq: Every day | ORAL | 0 refills | Status: AC

## 2023-11-02 MED ORDER — AMOXICILLIN-POT CLAVULANATE 875-125 MG PO TABS: 1.0000 | ORAL_TABLET | Freq: Two times a day (BID) | ORAL | 0 refills | Status: AC

## 2023-11-02 MED ORDER — APIXABAN 5 MG PO TABS: 5.0000 mg | ORAL_TABLET | Freq: Two times a day (BID) | ORAL | 0 refills | Status: DC

## 2023-11-02 MED ORDER — PREDNISONE 10 MG PO TABS: ORAL_TABLET | ORAL | 0 refills | Status: AC

## 2023-11-02 MED ORDER — APIXABAN 5 MG PO TABS
5.0000 mg | ORAL_TABLET | Freq: Two times a day (BID) | ORAL | Status: DC
  Administered 2023-11-02: 5 mg via ORAL
  Filled 2023-11-02: qty 1

## 2023-11-02 NOTE — Progress Notes (Signed)
 PHARMACY - ANTICOAGULATION CONSULT NOTE  Pharmacy Consult for heparin >> apixaban Indication: atrial fibrillation  Allergies  Allergen Reactions   Breztri Aerosphere [Budeson-Glycopyrrol-Formoterol]     Bad weezing    Spiractone [Spironolactone]     Facial nerve pain    Patient Measurements: Height: 5\' 9"  (175.3 cm) Weight: 92.8 kg (204 lb 9.4 oz) IBW/kg (Calculated) : 70.7 Heparin Dosing Weight: 91.8 kg  Vital Signs: Temp: 97.7 F (36.5 C) (02/27 0234) Temp Source: Oral (02/27 0234) BP: 138/93 (02/27 0234) Pulse Rate: 73 (02/27 0234)  Labs: Recent Labs    10/30/23 1849 10/30/23 1949 10/30/23 2125 10/31/23 0733 10/31/23 1751 11/01/23 0405 11/01/23 1149 11/02/23 0417  HGB 13.6  --   --  12.8*  --  12.6*  --  13.0  HCT 41.7  --   --  41.1  --  39.3  --  40.0  PLT 365  --   --  329  --  328  --  333  HEPARINUNFRC  --   --   --  0.27*   < > 0.35 0.40 0.27*  CREATININE 1.03  --   --  0.95  --  0.81  --   --   TROPONINIHS  --  10 11  --   --   --   --   --    < > = values in this interval not displayed.    Estimated Creatinine Clearance: 88.6 mL/min (by C-G formula based on SCr of 0.81 mg/dL).  Assessment: Christopher Peck is a 78 y.o. year old male admitted on 10/30/2023 with shortness of breath and concern for afib. No anticoagulation prior to admission. Pharmacy consulted to dose heparin.  2/25 : thoracentesis  11/02/2023  -Heparin level 0.27 -subtherapeutic on heparin at 1400 units/hr -Hg/Pltc wnl -No infusion related issues or bleeding reported by RN To transition to apixaban today  Goal of Therapy:  Heparin level 0.3-0.7 units/ml Monitor platelets by anticoagulation protocol: Yes   Plan:  Dc heparin drip Apixaban 5 mg po bid Will provide education & 30 day free card DC heparin labs  Thank you for allowing pharmacy to participate in this patient's care.  Herby Abraham, Pharm.D Use secure chat for questions 11/02/2023 7:35 AM

## 2023-11-02 NOTE — Discharge Instructions (Addendum)

## 2023-11-02 NOTE — Discharge Summary (Signed)
 Physician Discharge Summary  Christopher Peck UUV:[id number] DOB: 1946/06/29 DOA: 10/30/2023  PCP: Lucky Cowboy, MD  Admit date: 10/30/2023 Discharge date: 11/02/2023  Admitted From: Home Disposition: Home  Recommendations for Outpatient Follow-up:  Follow up with PCP in 1-2 weeks Referral placed to atrial fibrillation clinic for follow-up Started on Eliquis for A-fib per cardiology recommendations Continue antibiotics with azithromycin and Augmentin to complete course for community-acquired pneumonia Continue prednisone taper on discharge for COPD exacerbation  Home Health: No Equipment/Devices: None  Discharge Condition: Stable CODE STATUS: Full code Diet recommendation: Heart healthy diet  History of present illness:  Christopher Peck is a 78 y.o. 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  Patient denies fever, no chills, no sore throat, no chest pain, no palpitations, no edema of his lower extremities, no abdominal pain, no nausea/vomiting/diarrhea, no urinary symptoms.   In the ED, temperature 98.3 F, HR 130, RR 24, BP 151/103, SpO2 96% on 2 L nasal cannula.  WBC 11.3, hemoglobin 13.6, platelet count 365.  Sodium 136, potassium 4.1, chloride 99, CO2 26, glucose 104, BUN 17, creat 1.03.  High sensitive troponin 10> 11.  BNP 237.5.  COVID/influenza/RSV PCR negative.  Chest x-ray with new moderate size left pleural effusion, chronic opacity left suprahilar area with volume loss. Patient received methylprednisolone 125 mg IVP, magnesium sulfate 2 g IVPB, Xopenex nebs and was started on a heparin infusion.  TRH consulted for admission.  Hospital course:  Acute respiratory failure: Resolved The patient  presenting with progressive shortness of breath, wheezing over the past 3 days.  Associated with wheezing.   Imaging notable for emphysema.  Afebrile with elevated WBC count 11.3. Etiology likely multifactorial with pleural effusion, COPD exacerbation and community-acquired pneumonia.  Patient initially required supple oxygen which was titrated off following treatment of COPD, pneumonia and pleural effusion as below.  No desaturation with exertion was noted at time of discharge.   Left pleural effusion Imaging notable for left pleural effusion.  IR was consulted and patient underwent thoracentesis on 10/31/2023 with 180 mL fluid removed. Pleural fluid Gram stain with no organisms   COPD exacerbation Patient was supported with scheduled DuoNeb treatments, IV steroids.  Patient was weaned off of supplemental oxygen.  Continue prednisone taper on discharge.  Patient has had many adverse reactions to COPD controlling inhalers to include Breztri.  Outpatient follow-up with pulmonology as needed.  Community-acquired pneumonia Continue treatment with azithromycin and Augmentin to complete 5-day course on discharge.-- Azithromycin 500 mg p.o. daily x 5 days   Paroxysmal atrial fibrillation/atrial flutter Patient with history of palpitations, was apparently in atrial fibrillation during physicians visit about a year ago, declined oral anticoagulant at that time.  Currently not on any rate controlling medications.  Cardiology was consulted and followed during hospital course. TTE: LVEF 60 to 65%, no LV RWMA, RV systolic function normal, biatrial enlargement, IVC normal in size.  Initially placed on heparin drip and transition to Eliquis per cardiology recommendations on discharge.  Referral placed to atrial fibrillation clinic outpatient.   Essential hypertension BP 138/93, currently not on antihypertensives outpatient.   Hypothyroidism Levothyroxine 125 mcg p.o. daily   BPH Finasteride 5 mg p.o. daily    Depression Citalopram 20 mg p.o. daily   Obesity, class I  Body mass index is 30.21 kg/m.   Discharge Diagnoses:  Principal Problem:   Acute respiratory failure with hypoxia (HCC) Active Problems:   CORONARY ATHEROSCLEROSIS by CT scan in 2010   Hyperlipidemia, mixed   Hypothyroidism   COPD   BPH/Prostatism   Depression, major, recurrent, in partial remission (HCC)   Peripheral neuropathy   Paroxysmal atrial fibrillation (HCC) - Refuses treatment   Normocytic anemia   Class 1 obesity   Lung cancer (HCC)   Pleural effusion on left    Discharge Instructions  Discharge Instructions     Amb referral to AFIB Clinic   Complete by: As directed    Call MD for:  difficulty breathing, headache or visual disturbances   Complete by: As directed    Call MD for:  extreme fatigue   Complete by: As directed    Call MD for:  persistant dizziness or light-headedness   Complete by: As directed    Call MD for:  persistant nausea and vomiting   Complete by: As directed    Call MD for:  severe uncontrolled pain   Complete by: As directed    Call MD for:  temperature >100.4   Complete by: As directed    Diet - low sodium heart healthy   Complete by: As directed    Increase activity slowly   Complete by: As directed       Allergies as of 11/02/2023       Reactions   Breztri Aerosphere [budeson-glycopyrrol-formoterol]    Bad weezing    Spiractone [spironolactone]    Facial nerve pain        Medication List     TAKE these medications    albuterol 108 (90 Base) MCG/ACT inhaler Commonly known as: VENTOLIN HFA USE 2 INHALATIONS 15 MINUTES APART EVERY 4 HOURS AS NEEDED TO RESCUE ASTHMA ATTACK What changed: See the new instructions.   albuterol 1.25 MG/3ML nebulizer solution Commonly known as: ACCUNEB USE 1 VIAL VIA NEBULIZER EVERY 6 HOURS AS NEEDED FOR WHEEZING What changed: See the new instructions.   amoxicillin-clavulanate 875-125 MG tablet Commonly known as:  AUGMENTIN Take 1 tablet by mouth 2 (two) times daily for 3 days.   apixaban 5 MG Tabs tablet Commonly known as: ELIQUIS Take 1 tablet (5 mg total) by mouth 2 (two) times daily.   azithromycin 500 MG tablet Commonly known as: Zithromax Take 1 tablet (500 mg total) by mouth daily for 3 days.   citalopram 20 MG tablet Commonly known as: CELEXA TAKE 1 TABLET BY MOUTH EVERY DAY FOR MOOD What changed:  how much to take how to take this when to take this   finasteride 5 MG tablet Commonly known as: PROSCAR TAKE 1 TABLET (5 MG TOTAL) BY MOUTH DAILY.   levothyroxine 125 MCG tablet Commonly known as: SYNTHROID TAKE 1 TABLET DAILY ON AN EMPTY STOMACH WITH WATER FOR 30 MINUTES & NO ANTACID MEDS, CALCIUM OR MAGNESIUM FOR 4 HOURS & AVOID BIOTIN What changed:  how much to take how to take this when to take this additional instructions   predniSONE 10 MG tablet Commonly known as: DELTASONE Take 4 tablets (40 mg total) by mouth daily for 2 days, THEN 3 tablets (30 mg total) daily for 2 days, THEN 2 tablets (20 mg total) daily for 2 days, THEN 1 tablet (10 mg total) daily for 2 days. Start taking on: November 03, 2023        Follow-up Information     Oneta Rack,  Chrissie Noa, MD. Schedule an appointment as soon as possible for a visit in 1 week(s).   Specialty: Internal Medicine Contact information: [address] [place] [address] [phone]         [facility]. Schedule an appointment as soon as possible for a visit.   Contact information: [address] [place] [address] [phone]               Allergies  Allergen Reactions   Jerrye Bushy [Budeson-Glycopyrrol-Formoterol]     Bad weezing    Spiractone [Spironolactone]     Facial nerve pain    Consultations: Cardiology   Procedures/Studies: ECHOCARDIOGRAM COMPLETE Result Date: 11/01/2023    ECHOCARDIOGRAM REPORT   Patient Name:   JASHUA KNAAK  Date of Exam: 11/01/2023 Medical Rec #:  [id number]       Height:       69.0 in Accession #:    [id number]      Weight:       204.6 lb Date of Birth:  Aug 14, 1946       BSA:          2.086 m Patient Age:    77 years        BP:           142/91 mmHg Patient Gender: M               HR:           70 bpm. Exam Location:  Inpatient Procedure: 2D Echo, Cardiac Doppler, Color Doppler and Intracardiac            Opacification Agent (Both Spectral and Color Flow Doppler were            utilized during procedure). Indications:    Atrial Fibrillation I48.91  History:        Patient has no prior history of Echocardiogram examinations.                 Arrythmias:Atrial Fibrillation; Signs/Symptoms:Hypotension.  Sonographer:    Webb Laws Referring Phys: [id number] DAVID MANUEL ORTIZ IMPRESSIONS  1. Left ventricular ejection fraction, by estimation, is 60 to 65%. The left ventricle has normal function. The left ventricle has no regional wall motion abnormalities. Left ventricular diastolic function could not be evaluated.  2. Right ventricular systolic function is normal. The right ventricular size is normal.  3. Left atrial size was mildly dilated.  4. Right atrial size was mildly dilated.  5. The mitral valve is normal in structure. No evidence of mitral valve regurgitation. No evidence of mitral stenosis.  6. The aortic valve is tricuspid. There is mild calcification of the aortic valve. Aortic valve regurgitation is not visualized. No aortic stenosis is present.  7. The inferior vena cava is normal in size with greater than 50% respiratory variability, suggesting right atrial pressure of 3 mmHg. FINDINGS  Left Ventricle: Left ventricular ejection fraction, by estimation, is 60 to 65%. The left ventricle has normal function. The left ventricle has no regional wall motion abnormalities. Definity contrast agent was given IV to delineate the left ventricular  endocardial borders. Strain imaging was not performed. The left  ventricular internal cavity size was normal in size. There is no left ventricular hypertrophy. Left ventricular diastolic function could not be evaluated due to atrial fibrillation. Left ventricular diastolic function could not be evaluated. Right Ventricle: The right ventricular size is normal. No increase in right ventricular wall thickness. Right ventricular systolic function is normal. Left  Atrium: Left atrial size was mildly dilated. Right Atrium: Right atrial size was mildly dilated. Pericardium: There is no evidence of pericardial effusion. Mitral Valve: The mitral valve is normal in structure. No evidence of mitral valve regurgitation. No evidence of mitral valve stenosis. Tricuspid Valve: The tricuspid valve is normal in structure. Tricuspid valve regurgitation is trivial. No evidence of tricuspid stenosis. Aortic Valve: The aortic valve is tricuspid. There is mild calcification of the aortic valve. Aortic valve regurgitation is not visualized. No aortic stenosis is present. Pulmonic Valve: The pulmonic valve was normal in structure. Pulmonic valve regurgitation is not visualized. No evidence of pulmonic stenosis. Aorta: The aortic root is normal in size and structure. Venous: The inferior vena cava is normal in size with greater than 50% respiratory variability, suggesting right atrial pressure of 3 mmHg. IAS/Shunts: No atrial level shunt detected by color flow Doppler. Additional Comments: 3D imaging was not performed.  LEFT VENTRICLE PLAX 2D LVIDd:         4.00 cm      Diastology LVIDs:         2.30 cm      LV e' medial:    10.80 cm/s LV PW:         0.90 cm      LV E/e' medial:  9.6 LV IVS:        1.10 cm      LV e' lateral:   11.90 cm/s LVOT diam:     2.00 cm      LV E/e' lateral: 8.7 LV SV:         53 LV SV Index:   26 LVOT Area:     3.14 cm  LV Volumes (MOD) LV vol d, MOD A2C: 116.0 ml LV vol d, MOD A4C: 110.0 ml LV vol s, MOD A2C: 49.1 ml LV vol s, MOD A4C: 49.9 ml LV SV MOD A2C:     66.9 ml LV SV  MOD A4C:     110.0 ml LV SV MOD BP:      62.8 ml RIGHT VENTRICLE             IVC RV Basal diam:  3.60 cm     IVC diam: 1.60 cm RV S prime:     14.10 cm/s TAPSE (M-mode): 2.1 cm LEFT ATRIUM             Index        RIGHT ATRIUM           Index LA diam:        4.20 cm 2.01 cm/m   RA Area:     17.20 cm LA Vol (A2C):   52.4 ml 25.12 ml/m  RA Volume:   41.00 ml  19.65 ml/m LA Vol (A4C):   58.5 ml 28.04 ml/m LA Biplane Vol: 56.4 ml 27.04 ml/m  AORTIC VALVE LVOT Vmax:   97.63 cm/s LVOT Vmean:  58.700 cm/s LVOT VTI:    0.170 m  AORTA Ao Root diam: 2.80 cm Ao Asc diam:  3.60 cm MITRAL VALVE                TRICUSPID VALVE MV Area (PHT): 3.99 cm     TR Peak grad:   17.3 mmHg MV Decel Time: 190 msec     TR Vmax:        208.00 cm/s MV E velocity: 103.70 cm/s MV A velocity: 47.20 cm/s   SHUNTS MV E/A ratio:  2.20  Systemic VTI:  0.17 m                             Systemic Diam: 2.00 cm Arvilla Meres MD Electronically signed by Arvilla Meres MD Signature Date/Time: 11/01/2023/9:52:07 AM    Final    DG Chest 1 View Result Date: 10/31/2023 CLINICAL DATA:  Status post thoracentesis. EXAM: CHEST  1 VIEW COMPARISON:  Chest CT dated 10/30/2023. FINDINGS: Small left pleural effusion and left lung base atelectasis. Left suprahilar scarring. No pneumothorax. The cardiac silhouette is within normal limits. No acute osseous pathology. IMPRESSION: Small left pleural effusion and left lung base atelectasis. No pneumothorax. Electronically Signed   By: Elgie Collard M.D.   On: 10/31/2023 16:23   US THORACENTESIS ASP PLEURAL SPACE W/IMG GUIDE Result Date: 10/31/2023 INDICATION: Patient with remote history of lung cancer, COPD, dyspnea, atrial fibrillation, small left pleural effusion. Request received for diagnostic and therapeutic left thoracentesis. EXAM: ULTRASOUND GUIDED DIAGNOSTIC AND THERAPEUTIC LEFT THORACENTESIS MEDICATIONS: 7 ml 1% lidocaine COMPLICATIONS: None immediate. PROCEDURE: An ultrasound guided  thoracentesis was thoroughly discussed with the patient and questions answered. The benefits, risks, alternatives and complications were also discussed. The patient understands and wishes to proceed with the procedure. Written consent was obtained. Ultrasound was performed to localize and mark an adequate pocket of fluid in the left chest. The area was then prepped and draped in the normal sterile fashion. 1% Lidocaine was used for local anesthesia. Under ultrasound guidance a 6 Fr Safe-T-Centesis catheter was introduced. Thoracentesis was performed. The catheter was removed and a dressing applied. FINDINGS: A total of approximately 180 cc of dark, bloody fluid was removed. Samples were sent to the laboratory as requested by the clinical team. IMPRESSION: Successful ultrasound guided diagnostic and therapeutic left thoracentesis yielding 180 cc of pleural fluid. Performed by: Artemio Aly Electronically Signed   By: Marliss Coots M.D.   On: 10/31/2023 16:02   CT Angio Chest PE W and/or Wo Contrast Result Date: 10/30/2023 CLINICAL DATA:  Shortness of breath and cough for 3 days. History of lung cancer. EXAM: CT ANGIOGRAPHY CHEST WITH CONTRAST TECHNIQUE: Multidetector CT imaging of the chest was performed using the standard protocol during bolus administration of intravenous contrast. Multiplanar CT image reconstructions and MIPs were obtained to evaluate the vascular anatomy. RADIATION DOSE REDUCTION: This exam was performed according to the departmental dose-optimization program which includes automated exposure control, adjustment of the mA and/or kV according to patient size and/or use of iterative reconstruction technique. CONTRAST:  75mL OMNIPAQUE IOHEXOL 350 MG/ML SOLN COMPARISON:  Chest CT from 2017 FINDINGS: Cardiovascular: The heart is normal in size for age. No pericardial effusion. The aorta is normal in caliber. Scattered atherosclerotic calcifications. No dissection. Scattered coronary artery  calcifications. The pulmonary arterial tree is well opacified. No filling defects to suggest pulmonary embolism. Mediastinum/Nodes: Mildly enlarged mediastinal and hilar lymph nodes. 15 mm right hilar node previously measured 6 mm. The esophagus is grossly normal. Lungs/Pleura: Moderate pleural thickening medially involving the left hemithorax may be progressive radiation fibrosis. There is a loculated left pleural fluid collection new since the prior chest CT. This measures as simple fluid. Associated loss of volume in the left hemithorax and dense radiation changes involving the paramediastinal lung tissue. No findings suspicious for recurrent tumor. Severe underlying lung disease with emphysema and respiratory bronchiolitis. No worrisome pulmonary nodules to suggest metastatic disease. The central tracheobronchial tree is unremarkable. Upper Abdomen: No significant upper  abdominal findings. No hepatic or adrenal gland lesions to suggest metastatic disease. Scattered aortic calcifications. No upper abdominal adenopathy. Musculoskeletal: No significant bony findings. Exaggerated thoracic kyphosis and midthoracic compression deformities. No worrisome bone lesions. Review of the MIP images confirms the above findings. IMPRESSION: 1. No CT findings for pulmonary embolism. 2. Normal caliber thoracic aorta with scattered atherosclerotic calcifications. 3. Scattered coronary artery calcifications. 4. Moderate pleural thickening medially involving the left hemithorax may be progressive radiation fibrosis. There is a loculated left pleural fluid collection new since the prior chest CT. This measures as simple fluid. Associated loss of volume in the left hemithorax and dense radiation changes involving the paramediastinal lung tissue. No findings suspicious for recurrent tumor. 5. Severe underlying lung disease with emphysema and respiratory bronchiolitis. 6. Mildly enlarged mediastinal and hilar lymph nodes. These are  likely reactive. Electronically Signed   By: Rudie Meyer M.D.   On: 10/30/2023 22:00   DG Chest Port 1 View Result Date: 10/30/2023 CLINICAL DATA:  Atrial fibrillation with cough and wheezing. EXAM: PORTABLE CHEST 1 VIEW COMPARISON:  Chest x-ray 11/25/2020 FINDINGS: There is a moderate-sized left pleural effusion. Chronic opacity in the left suprahilar area with volume loss again noted. Right lung is clear. Cardiomediastinal silhouette is stable and within normal limits. No acute fractures are seen. IMPRESSION: 1. New moderate-sized left pleural effusion. Can not exclude underlying atelectasis/airspace disease. 2. Chronic opacity in the left suprahilar area with volume loss again noted. Electronically Signed   By: Darliss Cheney M.D.   On: 10/30/2023 20:55     Subjective: Patient seen examined bedside, lying in bed.  No complaints this morning.  Remains off of oxygen.  Ready for discharge home.  Discussed will continue prednisone taper, antibiotics and started on Eliquis.  Asking about resumption of his home medications, discussed with him that he may resume all of his other home occasions without issue.  No other specific questions, concerns or complaints at this time.  Denies headache, no dizziness, no chest pain, no palpitations, no shortness of breath, no abdominal pain, no fever/chills/night sweats, no nausea/vomiting/diarrhea, no focal weakness, no fatigue, no paresthesias.  No acute events overnight per nurse staff.  Discharge Exam: Vitals:   11/02/23 0234 11/02/23 0855  BP: (!) 138/93   Pulse: 73   Resp: 17   Temp: 97.7 F (36.5 C)   SpO2: 92% 97%   Vitals:   11/01/23 2002 11/01/23 2055 11/02/23 0234 11/02/23 0855  BP:  134/79 (!) 138/93   Pulse:  77 73   Resp:  18 17   Temp:  (!) 97.5 F (36.4 C) 97.7 F (36.5 C)   TempSrc:  Oral Oral   SpO2: 97% 92% 92% 97%  Weight:      Height:        Physical Exam: GEN: NAD, alert and oriented x 3, wd/wn HEENT: NCAT, PERRL, EOMI,  sclera clear, MMM PULM: CTAB w/o wheezes/crackles, normal respiratory effort, on room air CV: RRR w/o M/G/R GI: abd soft, NTND, NABS, no R/G/M MSK: no peripheral edema, muscle strength globally intact 5/5 bilateral upper/lower extremities NEURO: CN II-XII intact, no focal deficits, sensation to light touch intact PSYCH: normal mood/affect Integumentary: dry/intact, no rashes or wounds    The results of significant diagnostics from this hospitalization (including imaging, microbiology, ancillary and laboratory) are listed below for reference.     Microbiology: Recent Results (from the past 240 hours)  Resp panel by RT-PCR (RSV, Flu A&B, Covid) Anterior Nasal Swab  Status: None   Collection Time: 10/30/23  7:49 PM   Specimen: Anterior Nasal Swab  Result Value Ref Range Status   SARS Coronavirus 2 by RT PCR NEGATIVE NEGATIVE Final    Comment: (NOTE) SARS-CoV-2 target nucleic acids are NOT DETECTED.  The SARS-CoV-2 RNA is generally detectable in upper respiratory specimens during the acute phase of infection. The lowest concentration of SARS-CoV-2 viral copies this assay can detect is 138 copies/mL. A negative result does not preclude SARS-Cov-2 infection and should not be used as the sole basis for treatment or other patient management decisions. A negative result may occur with  improper specimen collection/handling, submission of specimen other than nasopharyngeal swab, presence of viral mutation(s) within the areas targeted by this assay, and inadequate number of viral copies(<138 copies/mL). A negative result must be combined with clinical observations, patient history, and epidemiological information. The expected result is Negative.  Fact Sheet for Patients:  BloggerCourse.com  Fact Sheet for Healthcare Providers:  SeriousBroker.it  This test is no t yet approved or cleared by the [place] FDA and  has been  authorized for detection and/or diagnosis of SARS-CoV-2 by FDA under an Emergency Use Authorization (EUA). This EUA will remain  in effect (meaning this test can be used) for the duration of the COVID-19 declaration under Section 564(b)(1) of the Act, 21 U.S.C.section 360bbb-3(b)(1), unless the authorization is terminated  or revoked sooner.       Influenza A by PCR NEGATIVE NEGATIVE Final   Influenza B by PCR NEGATIVE NEGATIVE Final    Comment: (NOTE) The Xpert Xpress SARS-CoV-2/FLU/RSV plus assay is intended as an aid in the diagnosis of influenza from Nasopharyngeal swab specimens and should not be used as a sole basis for treatment. Nasal washings and aspirates are unacceptable for Xpert Xpress SARS-CoV-2/FLU/RSV testing.  Fact Sheet for Patients: BloggerCourse.com  Fact Sheet for Healthcare Providers: SeriousBroker.it  This test is not yet approved or cleared by the [place] FDA and has been authorized for detection and/or diagnosis of SARS-CoV-2 by FDA under an Emergency Use Authorization (EUA). This EUA will remain in effect (meaning this test can be used) for the duration of the COVID-19 declaration under Section 564(b)(1) of the Act, 21 U.S.C. section 360bbb-3(b)(1), unless the authorization is terminated or revoked.     Resp Syncytial Virus by PCR NEGATIVE NEGATIVE Final    Comment: (NOTE) Fact Sheet for Patients: BloggerCourse.com  Fact Sheet for Healthcare Providers: SeriousBroker.it  This test is not yet approved or cleared by the [place] FDA and has been authorized for detection and/or diagnosis of SARS-CoV-2 by FDA under an Emergency Use Authorization (EUA). This EUA will remain in effect (meaning this test can be used) for the duration of the COVID-19 declaration under Section 564(b)(1) of the Act, 21 U.S.C. section 360bbb-3(b)(1), unless the  authorization is terminated or revoked.  Performed at [facility], [address], [place], [place] [address]   Culture, blood (Routine X 2) w Reflex to ID Panel     Status: None (Preliminary result)   Collection Time: 10/31/23  7:26 AM   Specimen: BLOOD  Result Value Ref Range Status   Specimen Description   Final    BLOOD SITE NOT SPECIFIED Performed at [facility], [address] [place], [place] [address]    Special Requests   Final    BOTTLES DRAWN AEROBIC AND ANAEROBIC Blood Culture results may not be optimal due to an inadequate volume of blood received in  culture bottles Performed at [facility], [address] [place], [place] [address]    Culture   Final    NO GROWTH 2 DAYS Performed at [facility], [address], [place], [place] [address]    Report Status PENDING  Incomplete  Culture, blood (Routine X 2) w Reflex to ID Panel     Status: None (Preliminary result)   Collection Time: 10/31/23  7:33 AM   Specimen: BLOOD  Result Value Ref Range Status   Specimen Description   Final    BLOOD SITE NOT SPECIFIED Performed at [facility], [address] [place], [place] [address]    Special Requests   Final    BOTTLES DRAWN AEROBIC AND ANAEROBIC Blood Culture results may not be optimal due to an inadequate volume of blood received in culture bottles Performed at [facility], [address] [place], [place] [address]    Culture   Final    NO GROWTH 2 DAYS Performed at [facility], [address] [place], [place] [address]    Report Status PENDING  Incomplete  Gram stain     Status: None   Collection Time: 10/31/23  3:07 PM   Specimen: PATH Cytology Pleural fluid  Result Value Ref Range Status   Specimen Description   Final    PLEURAL Performed at [facility], [address] [place], [place] [address]    Special Requests   Final     NONE Performed at [facility], [address], [place], [place] [address]    Gram Stain   Final    RARE WBC PRESENT,BOTH PMN AND MONONUCLEAR NO ORGANISMS SEEN Performed at [facility], [address] [place], [place] [address]    Report Status 10/31/2023 FINAL  Final     Labs: BNP (last 3 results) Recent Labs    10/30/23 1949  BNP 237.5*   Basic Metabolic Panel: Recent Labs  Lab 10/30/23 1849 10/30/23 1949 10/31/23 0733 11/01/23 0405  NA 136  --  135 137  K 4.1  --  4.4 4.5  CL 99  --  101 104  CO2 26  --  20* 24  GLUCOSE 104*  --  142* 144*  BUN 17  --  18 27*  CREATININE 1.03  --  0.95 0.81  CALCIUM 9.7  --  9.1 8.8*  MG  --  1.9  --   --    Liver Function Tests: Recent Labs  Lab 11/01/23 0405  AST 22  ALT 19  ALKPHOS 70  BILITOT 0.5  PROT 6.8  ALBUMIN 2.9*   No results for input(s): "LIPASE", "AMYLASE" in the last 168 hours. No results for input(s): "AMMONIA" in the last 168 hours. CBC: Recent Labs  Lab 10/30/23 1849 10/31/23 0733 11/01/23 0405 11/02/23 0417  WBC 11.3* 10.5 18.5* 16.5*  NEUTROABS  --  9.6*  --   --   HGB 13.6 12.8* 12.6* 13.0  HCT 41.7 41.1 39.3 40.0  MCV 97.9 99.8 99.2 99.5  PLT 365 329 328 333   Cardiac Enzymes: No results for input(s): "CKTOTAL", "CKMB", "CKMBINDEX", "TROPONINI" in the last 168 hours. BNP: Invalid input(s): "POCBNP" CBG: No results for input(s): "GLUCAP" in the last 168 hours. D-Dimer No results for input(s): "DDIMER" in the last 72 hours. Hgb A1c No results for input(s): "HGBA1C" in the last 72 hours. Lipid Profile No results for input(s): "CHOL", "HDL", "  LDLCALC", "TRIG", "CHOLHDL", "LDLDIRECT" in the last 72 hours. Thyroid function studies Recent Labs    10/31/23 0733  TSH 1.097   Anemia work up No results for input(s): "VITAMINB12", "FOLATE", "FERRITIN", "TIBC", "IRON", "RETICCTPCT" in the last 72 hours. Urinalysis    Component Value Date/Time   COLORURINE  YELLOW 07/03/2023 1439   APPEARANCEUR CLEAR 07/03/2023 1439   LABSPEC 1.011 07/03/2023 1439   PHURINE 7.0 07/03/2023 1439   GLUCOSEU NEGATIVE 07/03/2023 1439   HGBUR NEGATIVE 07/03/2023 1439   BILIRUBINUR NEGATIVE 11/25/2020 1927   KETONESUR NEGATIVE 07/03/2023 1439   PROTEINUR NEGATIVE 07/03/2023 1439   NITRITE NEGATIVE 07/03/2023 1439   LEUKOCYTESUR NEGATIVE 07/03/2023 1439   Sepsis Labs Recent Labs  Lab 10/30/23 1849 10/31/23 0733 11/01/23 0405 11/02/23 0417  WBC 11.3* 10.5 18.5* 16.5*   Microbiology Recent Results (from the past 240 hours)  Resp panel by RT-PCR (RSV, Flu A&B, Covid) Anterior Nasal Swab     Status: None   Collection Time: 10/30/23  7:49 PM   Specimen: Anterior Nasal Swab  Result Value Ref Range Status   SARS Coronavirus 2 by RT PCR NEGATIVE NEGATIVE Final    Comment: (NOTE) SARS-CoV-2 target nucleic acids are NOT DETECTED.  The SARS-CoV-2 RNA is generally detectable in upper respiratory specimens during the acute phase of infection. The lowest concentration of SARS-CoV-2 viral copies this assay can detect is 138 copies/mL. A negative result does not preclude SARS-Cov-2 infection and should not be used as the sole basis for treatment or other patient management decisions. A negative result may occur with  improper specimen collection/handling, submission of specimen other than nasopharyngeal swab, presence of viral mutation(s) within the areas targeted by this assay, and inadequate number of viral copies(<138 copies/mL). A negative result must be combined with clinical observations, patient history, and epidemiological information. The expected result is Negative.  Fact Sheet for Patients:  BloggerCourse.com  Fact Sheet for Healthcare Providers:  SeriousBroker.it  This test is no t yet approved or cleared by the [place] FDA and  has been authorized for detection and/or diagnosis of SARS-CoV-2  by FDA under an Emergency Use Authorization (EUA). This EUA will remain  in effect (meaning this test can be used) for the duration of the COVID-19 declaration under Section 564(b)(1) of the Act, 21 U.S.C.section 360bbb-3(b)(1), unless the authorization is terminated  or revoked sooner.       Influenza A by PCR NEGATIVE NEGATIVE Final   Influenza B by PCR NEGATIVE NEGATIVE Final    Comment: (NOTE) The Xpert Xpress SARS-CoV-2/FLU/RSV plus assay is intended as an aid in the diagnosis of influenza from Nasopharyngeal swab specimens and should not be used as a sole basis for treatment. Nasal washings and aspirates are unacceptable for Xpert Xpress SARS-CoV-2/FLU/RSV testing.  Fact Sheet for Patients: BloggerCourse.com  Fact Sheet for Healthcare Providers: SeriousBroker.it  This test is not yet approved or cleared by the [place] FDA and has been authorized for detection and/or diagnosis of SARS-CoV-2 by FDA under an Emergency Use Authorization (EUA). This EUA will remain in effect (meaning this test can be used) for the duration of the COVID-19 declaration under Section 564(b)(1) of the Act, 21 U.S.C. section 360bbb-3(b)(1), unless the authorization is terminated or revoked.     Resp Syncytial Virus by PCR NEGATIVE NEGATIVE Final    Comment: (NOTE) Fact Sheet for Patients: BloggerCourse.com  Fact Sheet for Healthcare Providers: SeriousBroker.it  This test is not yet approved or cleared by the [place] and  has been authorized for detection and/or diagnosis of SARS-CoV-2 by FDA under an Emergency Use Authorization (EUA). This EUA will remain in effect (meaning this test can be used) for the duration of the COVID-19 declaration under Section 564(b)(1) of the Act, 21 U.S.C. section 360bbb-3(b)(1), unless the authorization is terminated or revoked.  Performed at [facility], [address], [place], [place] [address]   Culture, blood (Routine X 2) w Reflex to ID Panel     Status: None (Preliminary result)   Collection Time: 10/31/23  7:26 AM   Specimen: BLOOD  Result Value Ref Range Status   Specimen Description   Final    BLOOD SITE NOT SPECIFIED Performed at [facility], [address] [place], [place] [address]    Special Requests   Final    BOTTLES DRAWN AEROBIC AND ANAEROBIC Blood Culture results may not be optimal due to an inadequate volume of blood received in culture bottles Performed at [facility], [address], [place], [place] [address]    Culture   Final    NO GROWTH 2 DAYS Performed at [facility], [address], [place], [place] [address]    Report Status PENDING  Incomplete  Culture, blood (Routine X 2) w Reflex to ID Panel     Status: None (Preliminary result)   Collection Time: 10/31/23  7:33 AM   Specimen: BLOOD  Result Value Ref Range Status   Specimen Description   Final    BLOOD SITE NOT SPECIFIED Performed at [facility], [address] [place], [place] [address]    Special Requests   Final    BOTTLES DRAWN AEROBIC AND ANAEROBIC Blood Culture results may not be optimal due to an inadequate volume of blood received in culture bottles Performed at [facility], [address] [place], [place] [address]    Culture   Final    NO GROWTH 2 DAYS Performed at [facility], [address] [place], [place] [address]    Report Status PENDING  Incomplete  Gram stain     Status: None   Collection Time: 10/31/23  3:07 PM   Specimen: PATH Cytology Pleural fluid  Result Value Ref Range Status   Specimen Description   Final    PLEURAL Performed at [facility], [address], [place], [place] [address]    Special Requests   Final    NONE Performed at [facility], [address] [place], [place] [address]    Gram Stain   Final    RARE WBC PRESENT,BOTH PMN AND MONONUCLEAR NO ORGANISMS SEEN Performed at [facility], [address] [place], [place] [address]    Report Status 10/31/2023 FINAL  Final     Time coordinating discharge: Over 30 minutes  SIGNED:   Alvira Philips [place], DO  [facility] 11/02/2023, 10:19 AM

## 2023-11-02 NOTE — Progress Notes (Signed)
 PHARMACY - ANTICOAGULATION CONSULT NOTE  Pharmacy Consult for heparin  Indication: atrial fibrillation  Allergies  Allergen Reactions   Breztri Aerosphere [Budeson-Glycopyrrol-Formoterol]     Bad weezing    Spiractone [Spironolactone]     Facial nerve pain    Patient Measurements: Height: 5\' 9"  (175.3 cm) Weight: 92.8 kg (204 lb 9.4 oz) IBW/kg (Calculated) : 70.7 Heparin Dosing Weight: 91.8 kg  Vital Signs: Temp: 97.7 F (36.5 C) (02/27 0234) Temp Source: Oral (02/27 0234) BP: 138/93 (02/27 0234) Pulse Rate: 73 (02/27 0234)  Labs: Recent Labs    10/30/23 1849 10/30/23 1949 10/30/23 2125 10/31/23 0733 10/31/23 1751 11/01/23 0405 11/01/23 1149 11/02/23 0417  HGB 13.6  --   --  12.8*  --  12.6*  --  13.0  HCT 41.7  --   --  41.1  --  39.3  --  40.0  PLT 365  --   --  329  --  328  --  333  HEPARINUNFRC  --   --   --  0.27*   < > 0.35 0.40 0.27*  CREATININE 1.03  --   --  0.95  --  0.81  --   --   TROPONINIHS  --  10 11  --   --   --   --   --    < > = values in this interval not displayed.    Estimated Creatinine Clearance: 88.6 mL/min (by C-G formula based on SCr of 0.81 mg/dL).  Assessment: Christopher Peck is a 78 y.o. year old male admitted on 10/30/2023 with shortness of breath and concern for afib. No anticoagulation prior to admission. Pharmacy consulted to dose heparin.  2/25 : thoracentesis  11/02/2023  -Heparin level 0.27 -subtherapeutic on heparin at 1400 units/hr -Hg/Pltc wnl -No infusion related issues or bleeding reported by RN  Goal of Therapy:  Heparin level 0.3-0.7 units/ml Monitor platelets by anticoagulation protocol: Yes   Plan:  -Increase heparin infusion to 1550 units/hr - Check heparin level 8 hr after rate increase -Daily heparin level, CBC, and monitoring for bleeding -F/u plans for transition to DOAC  Thank you for allowing pharmacy to participate in this patient's care.   Terrilee Files, PharmD 11/02/2023 5:27 AM

## 2023-11-02 NOTE — Telephone Encounter (Signed)
 Contacted pt and changed his appt to 11/20/23, that was the earliest Dr.Olson had, asked pt if he wanted to try and find someone else he said no, he also has F/U with Cardiology that day.

## 2023-11-02 NOTE — Progress Notes (Signed)
 Discharge instructions reviewed with patient. All questions answered. All belongings accounted for. Patient to follow up with MD in  1 week.  Contacted patient's primary care providers office to request reschedule of appointment at end of march for sooner date related to urgency for post hospital appointment. Front desk stated they would reach out to the provider and then contact the patient back.   Patient medications hand delivered from outpatient pharmacy [facility] by this RN. PIV removed. Assisted via WC to private vehicle.

## 2023-11-02 NOTE — Telephone Encounter (Signed)
 Copied from CRM [id number]. Topic: Appointments - Scheduling Inquiry for Clinic >> Nov 02, 2023 11:00 AM Phill Myron wrote: Reason for CRM: Vincenza Hews from [facility] is callng to let you know that the patient is being discharged today and asking if you could see him sooner.  Please advise,  (within 7-14 days)

## 2023-11-03 ENCOUNTER — Telehealth: Payer: Self-pay

## 2023-11-03 NOTE — Transitions of Care (Post Inpatient/ED Visit) (Signed)
 11/03/2023  Name: Christopher Peck MRN: [id number] DOB: [birth year]  Today's TOC FU Call Status: Today's TOC FU Call Status:: Successful TOC FU Call Completed TOC FU Call Complete Date: 11/03/23 Patient's Name and Date of Birth confirmed.  Transition Care Management Follow-up Telephone Call Date of Discharge: 11/02/23 Discharge Facility: [facility]) Type of Discharge: Inpatient Admission Primary Inpatient Discharge Diagnosis:: Acute respiratory failure with hypoxia How have you been since you were released from the hospital?: Better Any questions or concerns?: Yes Patient Questions/Concerns:: Patient's wife had questions regarding the difference between Plavix (antiplatelet) and Eliquis (anticoagulation) - along with husband, we discussed how antiplatelets prevent platelets from clumping together and help prevent recurrence of blood clots after a myocardial infarction or stroke and anticoagulants are utilized to slow down the body's ability to form clots helping to prevent deep vein thromboses, pulmonary embolism, and strokes.  Items Reviewed: Did you receive and understand the discharge instructions provided?: Yes Medications obtained,verified, and reconciled?: Yes (Medications Reviewed) Any new allergies since your discharge?: No Dietary orders reviewed?: Yes Type of Diet Ordered:: Low sodium, heart healthy diet Do you have support at home?: Yes People in Home: spouse Name of Support/Comfort Primary Source: spouse, [name]  Medications Reviewed Today: Medications Reviewed Today     Reviewed by Marcos Eke, RN (Registered Nurse) on 11/03/23 at 1500  Med List Status: <None>   Medication Order Taking? Sig Documenting Provider Last Dose Status Informant  albuterol (ACCUNEB) 1.25 MG/3ML nebulizer solution [id number] Yes USE 1 VIAL VIA NEBULIZER EVERY 6 HOURS AS NEEDED FOR WHEEZING  Patient taking differently: Take 1 ampule by nebulization every 6 (six) hours as needed for  wheezing or shortness of breath.   Adela Glimpse, NP Taking Active Self, Pharmacy Records  albuterol (VENTOLIN HFA) 108 (90 Base) MCG/ACT inhaler [id number] Yes USE 2 INHALATIONS 15 MINUTES APART EVERY 4 HOURS AS NEEDED TO RESCUE ASTHMA ATTACK  Patient taking differently: Inhale 2 puffs into the lungs every 4 (four) hours as needed for wheezing or shortness of breath. Use  2 inhalations  15 minutes apart  every 4 hours  as needed to Rescue Asthma Attack   Raynelle Dick, NP Taking Active Self, Pharmacy Records  amoxicillin-clavulanate (AUGMENTIN) 875-125 MG tablet [id number] Yes Take 1 tablet by mouth 2 (two) times daily for 3 days. [place], Alvira Philips, DO Taking Active   apixaban (ELIQUIS) 5 MG TABS tablet [id number] Yes Take 1 tablet (5 mg total) by mouth 2 (two) times daily. [place], Alvira Philips, DO Taking Active   azithromycin (ZITHROMAX) 500 MG tablet [id number] Yes Take 1 tablet (500 mg total) by mouth daily for 3 days. [place], Alvira Philips, DO Taking Active   citalopram (CELEXA) 20 MG tablet [id number] Yes TAKE 1 TABLET BY MOUTH EVERY DAY FOR MOOD  Patient taking differently: Take 20 mg by mouth daily. TAKE 1 TABLET BY MOUTH EVERY DAY FOR MOOD   Raynelle Dick, NP Taking Active Self, Pharmacy Records  finasteride (PROSCAR) 5 MG tablet [id number] Yes TAKE 1 TABLET (5 MG TOTAL) BY MOUTH DAILY. Raynelle Dick, NP Taking Active Self, Pharmacy Records  levothyroxine (SYNTHROID) 125 MCG tablet [id number] Yes TAKE 1 TABLET DAILY ON AN EMPTY STOMACH WITH WATER FOR 30 MINUTES & NO ANTACID MEDS, CALCIUM OR MAGNESIUM FOR 4 HOURS & AVOID BIOTIN  Patient taking differently: Take 62.5-125 mcg by mouth See admin instructions. Take 1 tablet Monday, Wednesday, Friday and 1/2 tablet on Tuesday, Thursday, Saturday, and Sunday   Anda Kraft  E, NP Taking Active Self, Pharmacy Records  predniSONE (DELTASONE) 10 MG tablet [id number] Yes Take 4 tablets (40 mg total) by mouth daily for 2 days, THEN 3 tablets (30 mg total)  daily for 2 days, THEN 2 tablets (20 mg total) daily for 2 days, THEN 1 tablet (10 mg total) daily for 2 days. [place], Eric J, DO Taking Active             Home Care and Equipment/Supplies: Were Home Health Services Ordered?: NA Any new equipment or medical supplies ordered?: NA  Functional Questionnaire: Do you need assistance with bathing/showering or dressing?: No Do you need assistance with meal preparation?: No Do you need assistance with eating?: No Do you have difficulty maintaining continence: No Do you need assistance with getting out of bed/getting out of a chair/moving?: No Do you have difficulty managing or taking your medications?: No  Follow up appointments reviewed: PCP Follow-up appointment confirmed?: Yes Date of PCP follow-up appointment?: 11/20/23 Follow-up Provider: Lenor Coffin (new patient/hospital fu visit - patient's former PCP Dr. Oneta Rack passed away) Specialist Hospital Follow-up appointment confirmed?: Yes Date of Specialist follow-up appointment?: 10/23/23 Follow-Up Specialty Provider:: Robet Leu, [facility] - Cardiology follow up Do you need transportation to your follow-up appointment?: No Do you understand care options if your condition(s) worsen?: Yes-patient verbalized understanding   Britt Theard A. Mliss Fritz RN, BA, [facility], CRRN [facility] RN Care Manager, Transition of Care [phone]

## 2023-11-05 LAB — CULTURE, BLOOD (ROUTINE X 2)
Culture: NO GROWTH
Culture: NO GROWTH

## 2023-11-06 LAB — CYTOLOGY - NON PAP

## 2023-11-10 ENCOUNTER — Other Ambulatory Visit: Payer: Self-pay

## 2023-11-10 ENCOUNTER — Telehealth: Payer: Self-pay

## 2023-11-10 NOTE — Patient Instructions (Signed)
 Visit Information  Dear Christopher Peck,  Thank you for taking time to visit with me today. It is a pleasure working with you because you have adopted a proactive mindset toward improving your health.   And you came to our telephone visit well prepared with great questions and I hope I answered them all to your satisfaction! Keep the questions coming, and write down any additional questions that may arise this week and we will discuss on 3/14.   Please don't hesitate to contact me if I can be of assistance to you before our next scheduled telephone appointment.  Our next appointment is by telephone on 11/17/23 at 3pm.  Warm Regards,  Elnita Maxwell  Following is a copy of your care plan:   Goals Addressed             This Visit's Progress    Transition of Care Care Plan       Current Barriers:  Knowledge Deficits related to plan of care for management of Atrial Fibrillation , newly placed on anticoagulant Eliquis  RNCM Clinical Goal(s):  Patient will work with the Care Management team over the next 30 days to address Transition of Care Barriers: Medication access Medication Management Diet/Nutrition/Food Resources Support at home Provider appointments Functional/Safety Transportation through collaboration with Medical illustrator, provider, and care team.   Interventions: Evaluation of current treatment plan related to  self management and patient's adherence to plan as established by provider   AFIB Interventions: (Status: Ongoing) Short Term Goal   Counseled on increased risk of stroke due to Afib and benefits of anticoagulation for stroke prevention Reviewed importance of adherence to anticoagulant exactly as prescribed Counseled on avoidance of NSAIDs due to increased bleeding risk with anticoagulants Counseled on seeking medical attention after a head injury or if there is blood in the urine/stool Afib action plan reviewed Screening for signs and symptoms of depression related to  chronic disease state  Assessed social determinant of health barriers   COPD Interventions:  (Status:  Ongoing) Short Term Goal Advised patient to track and manage COPD triggers Advised patient to self assesses COPD action plan zone and make appointment with provider if in the yellow zone for 48 hours without improvement Advised patient to engage in light exercise as tolerated 3-5 days a week to aid in the the management of COPD Provided education about and advised patient to utilize infection prevention strategies to reduce risk of respiratory infection Discussed the importance of adequate rest and management of fatigue with COPD  Patient Goals/Self-Care Activities: Participate in Transition of Care Program/Attend TOC scheduled calls Take all medications as prescribed Attend all scheduled provider appointments Call pharmacy for medication refills 3-7 days in advance of running out of medications Perform all self care activities independently  Call provider office for new concerns or questions   Follow Up Plan:  The patient has been provided with contact information for the care management team and has been advised to call with any health related questions or concerns.  Next Transition of Care telephone appointment with [facility] Elnita Maxwell is 3/14 @ 3pm        Patient verbalizes understanding of instructions and care plan provided today and agrees to view in MyChart. Active MyChart status and patient understanding of how to access instructions and care plan via MyChart confirmed with patient.     The patient has been provided with contact information for the care management team and has been advised to call with any health related questions  or concerns.   Please call the care guide team at [phone] if you need to cancel or reschedule your appointment.   Please call 1-800-273-TALK (toll free, 24 hour hotline) if you are experiencing a Mental Health or Behavioral Health Crisis or need  someone to talk to.  Daniel Ritthaler A. Mliss Fritz RN, BA, [facility], CRRN [facility] RN Care Manager, Transition of Care [phone]

## 2023-11-10 NOTE — Patient Outreach (Signed)
 Care Management  Transitions of Care Program Transitions of Care Post-discharge week 2   11/10/2023 Name: Christopher Peck MRN: [id number] DOB: 12-28-1945  Subjective: [name] is a 78 y.o. year old male who is a primary care patient of Lucky Cowboy, MD. The Care Management team Engaged with patient by telephone to assess and address transitions of care needs.   Consent to Services:  Patient was given information about care management services, agreed to services, and gave verbal consent to participate.   Assessment:              Goals Addressed             This Visit's Progress    Transition of Care Care Plan       Current Barriers:  Knowledge Deficits related to plan of care for management of Atrial Fibrillation , newly placed on anticoagulant Eliquis  RNCM Clinical Goal(s):  Patient will work with the Care Management team over the next 30 days to address Transition of Care Barriers: Medication access Medication Management Diet/Nutrition/Food Resources Support at home Provider appointments Functional/Safety Transportation through collaboration with Medical illustrator, provider, and care team.   Interventions: Evaluation of current treatment plan related to  self management and patient's adherence to plan as established by provider   AFIB Interventions: (Status: Ongoing) Short Term Goal   Counseled on increased risk of stroke due to Afib and benefits of anticoagulation for stroke prevention Reviewed importance of adherence to anticoagulant exactly as prescribed Counseled on avoidance of NSAIDs due to increased bleeding risk with anticoagulants Counseled on seeking medical attention after a head injury or if there is blood in the urine/stool Afib action plan reviewed Screening for signs and symptoms of depression related to chronic disease state  Assessed social determinant of health barriers   COPD Interventions:  (Status:  Ongoing) Short Term Goal Advised  patient to track and manage COPD triggers Advised patient to self assesses COPD action plan zone and make appointment with provider if in the yellow zone for 48 hours without improvement Advised patient to engage in light exercise as tolerated 3-5 days a week to aid in the the management of COPD Provided education about and advised patient to utilize infection prevention strategies to reduce risk of respiratory infection Discussed the importance of adequate rest and management of fatigue with COPD  Patient Goals/Self-Care Activities: Participate in Transition of Care Program/Attend TOC scheduled calls Take all medications as prescribed Attend all scheduled provider appointments Call pharmacy for medication refills 3-7 days in advance of running out of medications Perform all self care activities independently  Call provider office for new concerns or questions   Follow Up Plan:  The patient has been provided with contact information for the care management team and has been advised to call with any health related questions or concerns.  Next Transition of Care telephone appointment with [facility] Elnita Maxwell is 3/14 @ 3pm        Plan: The patient has been provided with contact information for the care management team and has been advised to call with any health related questions or concerns.   Dynastee Brummell A. Mliss Fritz RN, BA, [facility], CRRN [facility] RN Care Manager, Transition of Care [phone]

## 2023-11-15 NOTE — Progress Notes (Unsigned)
 New Patient Office Visit  Subjective   Patient ID: Christopher Peck, male    DOB: 02-02-1946  Age: 78 y.o. MRN: [id number]  CC: No chief complaint on file.   HPI Christopher Peck presents to establish care   Afib clnic?   PMH: HTN, hypothyroid, bph, seizure, cvavit d, copd, depression  Pleural effusion - thoracentesis.    Copd - prednisone   Cap - augmentin and azithro  Afib - eliquis  PSH: ***  FH: ***  Tobacco use: *** Alcohol use: *** Drug use: *** Marital status: *** Employment: *** Sexual hx: ***  Screenings:  Colon Cancer: *** Lung Cancer: *** Breast Cancer: *** Diabetes: *** HLD: ***   Outpatient Encounter Medications as of 11/16/2023  Medication Sig   albuterol (ACCUNEB) 1.25 MG/3ML nebulizer solution USE 1 VIAL VIA NEBULIZER EVERY 6 HOURS AS NEEDED FOR WHEEZING (Patient taking differently: Take 1 ampule by nebulization every 6 (six) hours as needed for wheezing or shortness of breath.)   albuterol (VENTOLIN HFA) 108 (90 Base) MCG/ACT inhaler USE 2 INHALATIONS 15 MINUTES APART EVERY 4 HOURS AS NEEDED TO RESCUE ASTHMA ATTACK (Patient taking differently: Inhale 2 puffs into the lungs every 4 (four) hours as needed for wheezing or shortness of breath. Use  2 inhalations  15 minutes apart  every 4 hours  as needed to Rescue Asthma Attack)   apixaban (ELIQUIS) 5 MG TABS tablet Take 1 tablet (5 mg total) by mouth 2 (two) times daily.   citalopram (CELEXA) 20 MG tablet TAKE 1 TABLET BY MOUTH EVERY DAY FOR MOOD (Patient taking differently: Take 20 mg by mouth daily. TAKE 1 TABLET BY MOUTH EVERY DAY FOR MOOD)   finasteride (PROSCAR) 5 MG tablet TAKE 1 TABLET (5 MG TOTAL) BY MOUTH DAILY.   levothyroxine (SYNTHROID) 125 MCG tablet TAKE 1 TABLET DAILY ON AN EMPTY STOMACH WITH WATER FOR 30 MINUTES & NO ANTACID MEDS, CALCIUM OR MAGNESIUM FOR 4 HOURS & AVOID BIOTIN (Patient taking differently: Take 62.5-125 mcg by mouth See admin instructions. Take 1 tablet Monday,  Wednesday, Friday and 1/2 tablet on Tuesday, Thursday, Saturday, and Sunday)   No facility-administered encounter medications on file as of 11/16/2023.    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   Emphysema of lung (HCC)    History of bilateral inguinal hernia repair 05/11/2009   Hypertension    Seizures (HCC) 11/30/2020   Vitamin D deficiency 01/15/2014    Past Surgical History:  Procedure Laterality Date   AORTIC ARCH ANGIOGRAPHY N/A 12/09/2020   Procedure: AORTIC ARCH ANGIOGRAPHY;  Surgeon: Hawken, Thomas N, MD;  Location: [facility] INVASIVE CV LAB;  Service: Cardiovascular;  Laterality: N/A;   CATARACT EXTRACTION, BILATERAL     08 /21, 09/21   HERNIA REPAIR     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   Procedure: PERIPHERAL VASCULAR INTERVENTION;  Surgeon: Leonie Douglas, MD;  Location: [facility] INVASIVE CV LAB;  Service: Cardiovascular;  Laterality: Left;  LCCA    Family History  Problem Relation Age of Onset   COPD Father    Kidney failure Father     Social History   Socioeconomic History   Marital status: Married    Spouse name: Not on file   Number of children: Not on file   Years of education: Not on file   Highest education level: Not on file  Occupational History   Not on file  Tobacco Use  Smoking status: Former    Current packs/day: 0.00    Types: Cigarettes    Quit date: 09/05/2004    Years since quitting: 19.2   Smokeless tobacco: Never  Vaping Use   Vaping status: Never Used  Substance and Sexual Activity   Alcohol use: Yes    Alcohol/week: 3.0 standard drinks of alcohol    Types: 3 Standard drinks or equivalent per week    Comment: none   Drug use: Never   Sexual activity: Not on file  Other Topics Concern   Not on file  Social History Narrative   Right handed   Caffeine use:  1 cup coffee and tea per day   Lives with spouse, Christopher Peck.   Social Drivers of Manufacturing engineer Strain: Not on file  Food Insecurity: No Food Insecurity (11/03/2023)   Hunger Vital Sign    Worried About Running Out of Food in the Last Year: Never true    Ran Out of Food in the Last Year: Never true  Transportation Needs: No Transportation Needs (11/03/2023)   PRAPARE - Administrator, Civil Service (Medical): No    Lack of Transportation (Non-Medical): No  Physical Activity: Not on file  Stress: Not on file  Social Connections: Socially Integrated (10/31/2023)   Social Connection and Isolation Panel [NHANES]    Frequency of Communication with Friends and Family: Three times a week    Frequency of Social Gatherings with Friends and Family: Once a week    Attends Religious Services: 1 to 4 times per year    Active Member of [employer] or Organizations: Yes    Attends Engineer, structural: More than 4 times per year    Marital Status: Married  Catering manager Violence: Not At Risk (11/03/2023)   Humiliation, Afraid, Rape, and Kick questionnaire    Fear of Current or Ex-Partner: No    Emotionally Abused: No    Physically Abused: No    Sexually Abused: No    ROS     Objective   There were no vitals taken for this visit.  Physical Exam     Assessment & Plan:   There are no diagnoses linked to this encounter.  No follow-ups on file.   Christopher Kitty, MD

## 2023-11-16 ENCOUNTER — Encounter: Payer: Self-pay | Admitting: Family Medicine

## 2023-11-16 ENCOUNTER — Ambulatory Visit (INDEPENDENT_AMBULATORY_CARE_PROVIDER_SITE_OTHER): Admitting: Family Medicine

## 2023-11-16 VITALS — BP 105/73 | HR 76 | Ht 69.0 in | Wt 204.4 lb

## 2023-11-16 DIAGNOSIS — J9601 Acute respiratory failure with hypoxia: Secondary | ICD-10-CM | POA: Diagnosis not present

## 2023-11-16 DIAGNOSIS — Z7689 Persons encountering health services in other specified circumstances: Secondary | ICD-10-CM

## 2023-11-16 DIAGNOSIS — L4050 Arthropathic psoriasis, unspecified: Secondary | ICD-10-CM | POA: Diagnosis not present

## 2023-11-16 DIAGNOSIS — F3341 Major depressive disorder, recurrent, in partial remission: Secondary | ICD-10-CM | POA: Diagnosis not present

## 2023-11-16 DIAGNOSIS — L409 Psoriasis, unspecified: Secondary | ICD-10-CM | POA: Insufficient documentation

## 2023-11-16 DIAGNOSIS — I482 Chronic atrial fibrillation, unspecified: Secondary | ICD-10-CM | POA: Diagnosis not present

## 2023-11-16 DIAGNOSIS — Z66 Do not resuscitate: Secondary | ICD-10-CM

## 2023-11-16 MED ORDER — PANTOPRAZOLE SODIUM 40 MG PO TBEC
40.0000 mg | DELAYED_RELEASE_TABLET | Freq: Every day | ORAL | 1 refills | Status: DC
Start: 2023-11-16 — End: 2023-12-08

## 2023-11-16 MED ORDER — PREDNISONE 10 MG PO TABS: ORAL_TABLET | ORAL | 0 refills | Status: DC

## 2023-11-16 NOTE — Assessment & Plan Note (Signed)
 Currently on Eliquis.  Not on rate or rhythm control medication.  Prescribed Protonix given his steroid use and SSRI use.  Continue Eliquis.  Has follow-up with cardiology next Monday.

## 2023-11-16 NOTE — Assessment & Plan Note (Signed)
 Patient has been taking citalopram for many years ever since his initial cancer diagnosis.  We discussed how recently there is evidence of increased small risk of bleeding when combining SSRIs and anticoagulants.  Patient understanding and would wish to continue the SSRI.

## 2023-11-16 NOTE — Patient Instructions (Addendum)
 It was nice to see you today,  We addressed the following topics today: -I have sent in a referral to the rheumatologist.  If you are able to find a rheumatologist who can get you in sooner than the 1 refer to let us know and we can send a referral to them - In the meantime I have prescribed a steroid taper for you to help with your acute symptoms - You should avoid NSAIDs due to the risk of bleeding. - I would recommend taking 40 mg of Protonix a day to help reduce risk of stomach ulcers given your use of Eliquis and citalopram and prednisone. - I am going to order some lab tests and would like you to come back for a lab visit to have those drawn. - I would like to see back in 1 month.  Have a great day,  Frederic Jericho, MD

## 2023-11-16 NOTE — Assessment & Plan Note (Signed)
 Respiratory failure resolved.  On room air now.  Breathing is much improved.

## 2023-11-16 NOTE — Assessment & Plan Note (Signed)
 Patient states he has a long history of psoriasis but has not been referred to rheumatology in the past and does not take medication for it.  States he has had significant debilitating arthritis for the past few months that improved with steroids while in the hospital and returned after stopping steroids.  He does have skin lesions on the legs and arms currently.  Patient wishes for some medication for his arthritis, likely psoriatic arthritis.  We discussed how steroids are not a good long-term option, but given his history of respiratory disorder and lung cancer other options for treatment have their own risks and side effects.  Advised him I would prefer if he spoke to a rheumatologist about the best treatment options for his psoriasis and psoriatic arthritis.  Given his severity of his current pain did agree to provide an additional steroid taper over the next few weeks while we wait for rheumatology appointment.

## 2023-11-17 ENCOUNTER — Telehealth: Payer: Self-pay

## 2023-11-17 ENCOUNTER — Other Ambulatory Visit: Payer: Self-pay

## 2023-11-17 NOTE — Patient Outreach (Signed)
 Care Management  Transitions of Care Program Transitions of Care Post-discharge week 3   11/17/2023 Name: Christopher Peck MRN: [id number] DOB: [birth year]  Subjective: Christopher Peck is a 78 y.o. year old male who is a primary care patient of Christopher Kitty, MD. The Care Management team Engaged with patient Engaged with patient by telephone to assess and address transitions of care needs.   Consent to Services:  Patient was given information about care management services, agreed to services, and gave verbal consent to participate.   Assessment:              Goals Addressed             This Visit's Progress    Transition of Care Care Plan       Current Barriers:  Knowledge Deficits related to plan of care for management of Atrial Fibrillation , newly placed on anticoagulant Eliquis  RNCM Clinical Goal(s):  Patient will work with the Care Management team over the next 30 days to address Transition of Care Barriers: Medication access Medication Management Diet/Nutrition/Food Resources Support at home Provider appointments Functional/Safety Transportation through collaboration with Medical illustrator, provider, and care team.   Interventions: Evaluation of current treatment plan related to  self management and patient's adherence to plan as established by provider   AFIB Interventions: (Status: Ongoing) Short Term Goal   Counseled on increased risk of stroke due to Afib and benefits of anticoagulation for stroke prevention Reviewed importance of adherence to anticoagulant exactly as prescribed Counseled on avoidance of NSAIDs due to increased bleeding risk with anticoagulants Counseled on seeking medical attention after a head injury or if there is blood in the urine/stool Afib action plan reviewed Screening for signs and symptoms of depression related to chronic disease state  Assessed social determinant of health barriers Complete Hospital Follow up with Cardiologist  Christopher Peck on Monday 11/20/23   COPD Interventions:  (Status:  Ongoing) Short Term Goal Advised patient to track and manage COPD triggers Advised patient to self assesses COPD action plan zone and make appointment with provider if in the yellow zone for 48 hours without improvement Advised patient to engage in light exercise as tolerated 3-5 days a week to aid in the the management of COPD Provided education about and advised patient to utilize infection prevention strategies to reduce risk of respiratory infection Discussed the importance of adequate rest and management of fatigue with COPD 11/17/23 tele-visit w/ RNCM Patient reported meeting with his new PCP, Dr. Constance Goltz on 11/16/23 to review his recent hospitalization for acute respiratory failure which has been resolved. He also brought up his recent flare up of Arthritis and PCP referred him to Rheumatologist, Dr. Sheliah Hatch to discuss best options in treatment and prescribed a steroid taper to help with his current symptoms.    Patient Goals/Self-Care Activities: Participate in Transition of Care Program/Attend TOC scheduled calls Take all medications as prescribed Attend all scheduled provider appointments Call pharmacy for medication refills 3-7 days in advance of running out of medications Perform all self care activities independently  Call provider office for new concerns or questions   Follow Up Plan:  The patient has been provided with contact information for the care management team and has been advised to call with any health related questions or concerns.  Next Transition of Care telephone appointment with [facility] Elnita Maxwell is 3/21@ 3pm        Plan: The patient has been provided with contact information for  the care management team and has been advised to call with any health related questions or concerns.   Christopher Peck A. Mliss Fritz RN, BA, [facility] CRRN [facility] RN Care Manager, Transition of  Care [phone]

## 2023-11-17 NOTE — Patient Instructions (Signed)
 Visit Information  Dear. Garin,  Thank you for taking time to visit with me today. It is a pleasure & privilege to help you reach your healthcare management at home. Please don't hesitate to contact me if I can be of assistance to you before our next scheduled telephone appointment.  Our next appointment is by telephone on 11/24/23 at 3pm.  Warm Regards,  Elnita Maxwell  Following is a copy of your care plan:   Goals Addressed             This Visit's Progress    Transition of Care Care Plan       Current Barriers:  Knowledge Deficits related to plan of care for management of Atrial Fibrillation , newly placed on anticoagulant Eliquis  RNCM Clinical Goal(s):  Patient will work with the Care Management team over the next 30 days to address Transition of Care Barriers: Medication access Medication Management Diet/Nutrition/Food Resources Support at home Provider appointments Functional/Safety Transportation through collaboration with Medical illustrator, provider, and care team.   Interventions: Evaluation of current treatment plan related to  self management and patient's adherence to plan as established by provider   AFIB Interventions: (Status: Ongoing) Short Term Goal   Counseled on increased risk of stroke due to Afib and benefits of anticoagulation for stroke prevention Reviewed importance of adherence to anticoagulant exactly as prescribed Counseled on avoidance of NSAIDs due to increased bleeding risk with anticoagulants Counseled on seeking medical attention after a head injury or if there is blood in the urine/stool Afib action plan reviewed Screening for signs and symptoms of depression related to chronic disease state  Assessed social determinant of health barriers Complete [facility] Follow up with Cardiologist Robet Leu on Monday 11/20/23   COPD Interventions:  (Status:  Ongoing) Short Term Goal Advised patient to track and manage COPD triggers Advised patient  to self assesses COPD action plan zone and make appointment with provider if in the yellow zone for 48 hours without improvement Advised patient to engage in light exercise as tolerated 3-5 days a week to aid in the the management of COPD Provided education about and advised patient to utilize infection prevention strategies to reduce risk of respiratory infection Discussed the importance of adequate rest and management of fatigue with COPD 11/17/23 tele-visit w/ RNCM Patient reported meeting with his new PCP, Dr. Constance Goltz on 11/16/23 to review his recent hospitalization for acute respiratory failure which has been resolved. He also brought up his recent flare up of Arthritis and PCP referred him to Rheumatologist, Dr. Sheliah Hatch to discuss best options in treatment and prescribed a steroid taper to help with his current symptoms.    Patient Goals/Self-Care Activities: Participate in Transition of Care Program/Attend TOC scheduled calls Take all medications as prescribed Attend all scheduled provider appointments Call pharmacy for medication refills 3-7 days in advance of running out of medications Perform all self care activities independently  Call provider office for new concerns or questions   Follow Up Plan:  The patient has been provided with contact information for the care management team and has been advised to call with any health related questions or concerns.  Next Transition of Care telephone appointment with [facility] Elnita Maxwell is 3/21@ 3pm        Patient verbalizes understanding of instructions and care plan provided today and agrees to view in MyChart. Active MyChart status and patient understanding of how to access instructions and care plan via MyChart confirmed with patient.  The patient has been provided with contact information for the care management team and has been advised to call with any health related questions or concerns.   Please call the care guide team at  [phone] if you need to cancel or reschedule your appointment.   Please call 1-800-273-TALK (toll free, 24 hour hotline) if you are experiencing a Mental Health or Behavioral Health Crisis or need someone to talk to.  Euva Rundell A. Mliss Fritz RN, BA, CHPN, CRRN [facility] [employer] A. Mliss Fritz RN, BA, [facility], CRRN [facility] RN Care Manager, Transition of Care [phone] [facility] RN Care Manager, Transition of Care [phone]

## 2023-11-18 NOTE — Progress Notes (Unsigned)
 Cardiology Office Note:  .   Date:  11/20/2023  ID:  Christopher Peck, DOB 1946/07/14, MRN [id number] PCP: [name], MD  [facility] Providers Cardiologist:  Maisie Fus, MD   History of Present Illness: .   Christopher Peck is a 78 y.o. male with a past medical history of radiation fibrosis of the lung, history of CVA, left carotid artery thrombosis, left-sided pleural effusion in 10/2023, recently diagnosed atrial fibrillation, COPD, BPH, HTN, depression. Patient is followed by Dr. Wyline Mood and presents today for a hospital follow up appointment.   Patient was recently admitted 2/24-2/27/25. He had presented to the ED complaining of shortness of breath, wheezing, and cough. In the ED, BNP 237. COVID, Flu, RSV negative. CXR showed a new, moderate sized left pleural effusion. He was admitted with acute respiratory failure due to pleural effusion, COPD, community acquired pneumonia. IR was consulted and patient underwent thoracentesis that yielded 180 mL fluid. Pleural fluid gram stain showed no organisms. While admitted, patient went into atrial flutter, HR was well controlled without rate controlling medications. Echocardiogram 11/01/23 showed EF 60-65%, no regional wall motion abnormalities, normal RV function, no significant valvular abnormalities. HE was discharged on eliquis.   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 He reports feeling 'pretty good' since discharge, attributing his improved condition to the continued use of steroids. His breathing has significantly improved, stating he 'hasn't breathed like this in three, four years.' He denies any chest pain, fluttering, or pounding in the chest. He reports occasional dizziness upon standing up too quickly, but denies dizziness otherwise. He has noticed mild swelling in his ankles since leaving the hospital,  which does not improve with elevation. This does not bother him very much. Discussed starting lasix, but patient would prefer to avoid diuretics due to concerns of frequent urination   During his recent admission, he was started on Eliquis for atrial fibrillation. He denies any issues with bleeding or bruising since starting the medication. He expresses reluctance towards a cardioversion procedure to restore normal sinus rhythm, stating he would need to think about it. Notes that the afib/flutter does not bother him very much and he does not have many symptoms when in these heart rhythms.   The patient also reports arthritis symptoms, which significantly impact his mobility. He is currently on prednisone, which he says is the 'only way I can move.' He has been referred to a rheumatologist for further management   ROS: Patient denies chest pain, shortness of breath, palpitations, syncope, near syncope. Does have mild ankle edema and occasional dizziness upon standing   Studies Reviewed: .    Cardiac Studies & Procedures   ______________________________________________________________________________________________     ECHOCARDIOGRAM  ECHOCARDIOGRAM COMPLETE 11/01/2023  Narrative ECHOCARDIOGRAM REPORT    Patient Name:   Christopher Peck Date of Exam: 11/01/2023 Medical Rec #:  [id number]       Height:       69.0 in Accession #:    [id number]      Weight:       204.6 lb Date of Birth:  July 28, 1946       BSA:          2.086 m Patient Age:    77 years        BP:           142/91 mmHg Patient Gender: M  HR:           70 bpm. Exam Location:  Inpatient  Procedure: 2D Echo, Cardiac Doppler, Color Doppler and Intracardiac Opacification Agent (Both Spectral and Color Flow Doppler were utilized during procedure).  Indications:    Atrial Fibrillation I48.91  History:        Patient has no prior history of Echocardiogram examinations. Arrythmias:Atrial Fibrillation;  Signs/Symptoms:Hypotension.  Sonographer:    Webb Laws Referring Phys: [id number] DAVID MANUEL ORTIZ  IMPRESSIONS   1. Left ventricular ejection fraction, by estimation, is 60 to 65%. The left ventricle has normal function. The left ventricle has no regional wall motion abnormalities. Left ventricular diastolic function could not be evaluated. 2. Right ventricular systolic function is normal. The right ventricular size is normal. 3. Left atrial size was mildly dilated. 4. Right atrial size was mildly dilated. 5. The mitral valve is normal in structure. No evidence of mitral valve regurgitation. No evidence of mitral stenosis. 6. The aortic valve is tricuspid. There is mild calcification of the aortic valve. Aortic valve regurgitation is not visualized. No aortic stenosis is present. 7. The inferior vena cava is normal in size with greater than 50% respiratory variability, suggesting right atrial pressure of 3 mmHg.  FINDINGS Left Ventricle: Left ventricular ejection fraction, by estimation, is 60 to 65%. The left ventricle has normal function. The left ventricle has no regional wall motion abnormalities. Definity contrast agent was given IV to delineate the left ventricular endocardial borders. Strain imaging was not performed. The left ventricular internal cavity size was normal in size. There is no left ventricular hypertrophy. Left ventricular diastolic function could not be evaluated due to atrial fibrillation. Left ventricular diastolic function could not be evaluated.  Right Ventricle: The right ventricular size is normal. No increase in right ventricular wall thickness. Right ventricular systolic function is normal.  Left Atrium: Left atrial size was mildly dilated.  Right Atrium: Right atrial size was mildly dilated.  Pericardium: There is no evidence of pericardial effusion.  Mitral Valve: The mitral valve is normal in structure. No evidence of mitral valve regurgitation. No  evidence of mitral valve stenosis.  Tricuspid Valve: The tricuspid valve is normal in structure. Tricuspid valve regurgitation is trivial. No evidence of tricuspid stenosis.  Aortic Valve: The aortic valve is tricuspid. There is mild calcification of the aortic valve. Aortic valve regurgitation is not visualized. No aortic stenosis is present.  Pulmonic Valve: The pulmonic valve was normal in structure. Pulmonic valve regurgitation is not visualized. No evidence of pulmonic stenosis.  Aorta: The aortic root is normal in size and structure.  Venous: The inferior vena cava is normal in size with greater than 50% respiratory variability, suggesting right atrial pressure of 3 mmHg.  IAS/Shunts: No atrial level shunt detected by color flow Doppler.  Additional Comments: 3D imaging was not performed.   LEFT VENTRICLE PLAX 2D LVIDd:         4.00 cm      Diastology LVIDs:         2.30 cm      LV e' medial:    10.80 cm/s LV PW:         0.90 cm      LV E/e' medial:  9.6 LV IVS:        1.10 cm      LV e' lateral:   11.90 cm/s LVOT diam:     2.00 cm      LV E/e' lateral: 8.7 LV SV:  53 LV SV Index:   26 LVOT Area:     3.14 cm  LV Volumes (MOD) LV vol d, MOD A2C: 116.0 ml LV vol d, MOD A4C: 110.0 ml LV vol s, MOD A2C: 49.1 ml LV vol s, MOD A4C: 49.9 ml LV SV MOD A2C:     66.9 ml LV SV MOD A4C:     110.0 ml LV SV MOD BP:      62.8 ml  RIGHT VENTRICLE             IVC RV Basal diam:  3.60 cm     IVC diam: 1.60 cm RV S prime:     14.10 cm/s TAPSE (M-mode): 2.1 cm  LEFT ATRIUM             Index        RIGHT ATRIUM           Index LA diam:        4.20 cm 2.01 cm/m   RA Area:     17.20 cm LA Vol (A2C):   52.4 ml 25.12 ml/m  RA Volume:   41.00 ml  19.65 ml/m LA Vol (A4C):   58.5 ml 28.04 ml/m LA Biplane Vol: 56.4 ml 27.04 ml/m AORTIC VALVE LVOT Vmax:   97.63 cm/s LVOT Vmean:  58.700 cm/s LVOT VTI:    0.170 m  AORTA Ao Root diam: 2.80 cm Ao Asc diam:  3.60 cm  MITRAL  VALVE                TRICUSPID VALVE MV Area (PHT): 3.99 cm     TR Peak grad:   17.3 mmHg MV Decel Time: 190 msec     TR Vmax:        208.00 cm/s MV E velocity: 103.70 cm/s MV A velocity: 47.20 cm/s   SHUNTS MV E/A ratio:  2.20         Systemic VTI:  0.17 m Systemic Diam: 2.00 cm  Arvilla Meres MD Electronically signed by Arvilla Meres MD Signature Date/Time: 11/01/2023/9:52:07 AM    Final          ______________________________________________________________________________________________       Risk Assessment/Calculations:    CHA2DS2-VASc Score = 6  This indicates a 9.7% annual risk of stroke. The patient's score is based upon: CHF History: 0 HTN History: 1 Diabetes History: 0 Stroke History: 2 Vascular Disease History: 1 Age Score: 2 Gender Score: 0        Physical Exam:   VS:  BP 120/82 (BP Location: Left Arm, Patient Position: Sitting, Cuff Size: Normal)   Pulse 78   Ht 5\' 9"  (1.753 m)   Wt 204 lb (92.5 kg)   SpO2 96%   BMI 30.13 kg/m    Wt Readings from Last 3 Encounters:  11/20/23 204 lb (92.5 kg)  11/16/23 204 lb 6.4 oz (92.7 kg)  10/31/23 204 lb 9.4 oz (92.8 kg)    GEN: Well nourished, well developed in no acute distress. Sitting comfortably in the chair  NECK: No JVD CARDIAC: Irregular rate and rhythm, no murmurs, rubs, gallops. Radial pulses 2+ bilaterally  RESPIRATORY:  Diminished breath sounds in left lung base, otherwise clear to auscultation. Normal work of breathing on room air  ABDOMEN: Soft, non-tender, non-distended EXTREMITIES:  Trace edema in BLE; No deformity   ASSESSMENT AND PLAN: .    Persistent Atrial Flutter  - Reportedly was diagnosed with afib in the past, but he was unwilling to start anticoagulation -  Found to be in atrial flutter in 10/2023 when admitted with PNA, pleural effusion, pneumonia  - Echocardiogram from 10/2023 showed EF 60-65%, no regional wall motion abnormalities, normal RV function, no significant  valvular abnormalities. TSH normal in 10/2023. K 4.5, mag 1.9 - EKG today shows atrial flutter with variable AV block, HR 79 BPM  - HR well controlled without rate controlling medications. He denies palpitations, tachycardia at home  - Continue eliquis 5 mg BID. Patient denies bleeding or bruising on eliquis  - As patient has been compliant with blood thinner for about 3 weeks and remains in atrial flutter on EKG today, discussed possible outpatient cardioversion. Patient is not interested at this time. He reports that his atrial fibrillation does not really bother him, so he would rather manage medically for now  Coronary calcifications on CT scan  - Scattered coronary calcifications present on chest CTA from 10/2023  - Not on ASA due to eliquis use  - Patient denies chest pain  - Echocardiogram from 10/2023 showed EF 60-65%, no wall motion abnormalities  Hypothyroidism  - On levothyroxine  - TSH within normal limits in 10/2023   COPD  Recent L pleural effusion  - Patient admitted in 10/2023 with acute on chronic respiratory failure. Found to have a moderate L pleural effusion, underwent thoracentesis that yielded 180 mL fluid. Also had PNA, COPD exacerbation  - Patient reports that his breathing is currently the best it has been in 3-4 years. Currently completing a course of oral steroids  - COPD managed by PCP   S/p left carotid artery stenting - Stenting completed 12/2020  - Carotid ultrasounds from 02/2023 showed 2-39% stenosis in bilateral ICAs - Followed by vascular surgery   Dispo: Follow up in 4 months with APP   Signed, Jonita Albee, PA-C

## 2023-11-20 ENCOUNTER — Ambulatory Visit: Payer: Medicare Other | Admitting: Family Medicine

## 2023-11-20 ENCOUNTER — Ambulatory Visit: Payer: Medicare Other | Attending: Cardiology | Admitting: Cardiology

## 2023-11-20 VITALS — BP 120/82 | HR 78 | Ht 69.0 in | Wt 204.0 lb

## 2023-11-20 DIAGNOSIS — I2510 Atherosclerotic heart disease of native coronary artery without angina pectoris: Secondary | ICD-10-CM | POA: Diagnosis not present

## 2023-11-20 DIAGNOSIS — I484 Atypical atrial flutter: Secondary | ICD-10-CM | POA: Diagnosis not present

## 2023-11-20 DIAGNOSIS — E039 Hypothyroidism, unspecified: Secondary | ICD-10-CM

## 2023-11-20 DIAGNOSIS — J4489 Other specified chronic obstructive pulmonary disease: Secondary | ICD-10-CM | POA: Diagnosis not present

## 2023-11-20 DIAGNOSIS — J90 Pleural effusion, not elsewhere classified: Secondary | ICD-10-CM

## 2023-11-20 NOTE — Patient Instructions (Signed)
 Medication Instructions:  No changes *If you need a refill on your cardiac medications before your next appointment, please call your pharmacy*  Lab Work: No labs If you have labs (blood work) drawn today and your tests are completely normal, you will receive your results only by: MyChart Message (if you have MyChart) OR A paper copy in the mail If you have any lab test that is abnormal or we need to change your treatment, we will call you to review the results.  Testing/Procedures: No testing  Follow-Up: At [facility], you and your health needs are our priority.  As part of our continuing mission to provide you with exceptional heart care, we have created designated Provider Care Teams.  These Care Teams include your primary Cardiologist (physician) and Advanced Practice Providers (APPs -  Physician Assistants and Nurse Practitioners) who all work together to provide you with the care you need, when you need it.  We recommend signing up for the patient portal called "MyChart".  Sign up information is provided on this After Visit Summary.  MyChart is used to connect with patients for Virtual Visits (Telemedicine).  Patients are able to view lab/test results, encounter notes, upcoming appointments, etc.  Non-urgent messages can be sent to your provider as well.   To learn more about what you can do with MyChart, go to [email or web address].    Your next appointment:   4 month(s)  Provider:   Any APP  Other Instructions

## 2023-11-24 ENCOUNTER — Telehealth: Payer: Self-pay

## 2023-11-24 ENCOUNTER — Other Ambulatory Visit: Payer: Self-pay

## 2023-11-24 NOTE — Patient Instructions (Signed)
 Visit Information  Hello Mr. Christopher Peck,  Thank you for taking time to visit with me today which was our last Transition of Care (TOC) post-hospitalization tele-visit.   It has been a pleasure & privilege to get to know you and provide answers to your health/medication/diet questions. It is a nurse's dream to have a patient like you who is proactive about their health and has prepared questions to ask!   Please don't hesitate to contact me if I can be of assistance to you in the future @ [phone].  Warm Regards,  Sentoria Brent   Following is a copy of your care plan:   Goals Addressed             This Visit's Progress    Transition of Care Care Plan       Current Barriers:  Knowledge Deficits related to plan of care for management of Atrial Fibrillation , newly placed on anticoagulant Eliquis  RNCM Clinical Goal(s):  Patient will work with the Care Management team over the next 30 days to address Transition of Care Barriers: Medication access Medication Management Diet/Nutrition/Food Resources Support at home Provider appointments Functional/Safety Transportation through collaboration with Medical illustrator, provider, and care team.   Interventions: Evaluation of current treatment plan related to  self management and patient's adherence to plan as established by provider   AFIB Interventions: (Status: Ongoing) Short Term Goal   Counseled on increased risk of stroke due to Afib and benefits of anticoagulation for stroke prevention Reviewed importance of adherence to anticoagulant exactly as prescribed Counseled on avoidance of NSAIDs due to increased bleeding risk with anticoagulants Counseled on seeking medical attention after a head injury or if there is blood in the urine/stool Afib action plan reviewed Screening for signs and symptoms of depression related to chronic disease state  Assessed social determinant of health barriers Completed Hospital Follow up with Cardiologist  Robet Leu on Monday 11/20/23   COPD Interventions:  (Status:  Ongoing) Short Term Goal Advised patient to track and manage COPD triggers Advised patient to self assesses COPD action plan zone and make appointment with provider if in the yellow zone for 48 hours without improvement Advised patient to engage in light exercise as tolerated 3-5 days a week to aid in the the management of COPD Provided education about and advised patient to utilize infection prevention strategies to reduce risk of respiratory infection Discussed the importance of adequate rest and management of fatigue with COPD 11/17/23 tele-visit w/ RNCM Patient reported meeting with his new PCP, Dr. Constance Goltz on 11/16/23 to review his recent hospitalization for acute respiratory failure which has been resolved. He also brought up his recent flare up of Arthritis and PCP referred him to Rheumatologist, Dr. Sheliah Hatch to discuss best options in treatment and prescribed a steroid taper to help with his current symptoms.  11/24/23 tele-visit w/ RNCM patient reported follow up appointment w/ his Cardiologist, Robet Leu PA-C. No new meds were ordered. Pt states visit went well, BP, HR, EKG within normal limits, is to follow up in 3 months. Pt also has upcoming appt with Dr. 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 Reviewed Fall Precautions, and Bleeding Precautions due to being on long term anticoagulant.  Patient Goals/Self-Care Activities: Participate in Transition of Care Program/Attend TOC scheduled calls Take all  medications as prescribed Attend all scheduled provider appointments Call pharmacy for medication refills 3-7 days in advance of running out of medications Perform all self care activities independently   Call provider office for new concerns or questions   Follow Up Plan:  The patient has been provided with contact information for the care management team and has been advised to call with any health related questions or concerns.          Patient verbalizes understanding of instructions and care plan provided today and agrees to view in MyChart. Active MyChart status and patient understanding of how to access instructions and care plan via MyChart confirmed with patient.     The patient has been provided with contact information for the care management team and has been advised to call with any health related questions or concerns.   Please call the care guide team at [phone] if you need to cancel or reschedule your appointment.   Please call 1-800-273-TALK (toll free, 24 hour hotline) if you are experiencing a Mental Health or Behavioral Health Crisis or need someone to talk to.  Marcy Sookdeo A. Mliss Fritz RN, BA, [facility], CRRN [facility] RN Care Manager, Transition of Care [phone]

## 2023-11-24 NOTE — Patient Outreach (Signed)
 Care Management  Transitions of Care Program Transitions of Care Post-discharge week 4   11/24/2023 Name: Christopher Peck MRN: [id number] DOB: Aug 29, 1946  Subjective: Christopher Peck is a 78 y.o. year old male who is a primary care patient of [name], MD. The Care Management team Engaged with patient by telephone to assess and address transitions of care needs.   Consent to Services:  Patient was given information about care management services, agreed to services, and gave verbal consent to participate.   Assessment:              Goals Addressed             This Visit's Progress    Transition of Care Care Plan       Current Barriers:  Knowledge Deficits related to plan of care for management of Atrial Fibrillation , newly placed on anticoagulant Eliquis  RNCM Clinical Goal(s):  Patient will work with the Care Management team over the next 30 days to address Transition of Care Barriers: Medication access Medication Management Diet/Nutrition/Food Resources Support at home Provider appointments Functional/Safety Transportation through collaboration with Medical illustrator, provider, and care team.   Interventions: Evaluation of current treatment plan related to  self management and patient's adherence to plan as established by provider   AFIB Interventions: (Status: Ongoing) Short Term Goal   Counseled on increased risk of stroke due to Afib and benefits of anticoagulation for stroke prevention Reviewed importance of adherence to anticoagulant exactly as prescribed Counseled on avoidance of NSAIDs due to increased bleeding risk with anticoagulants Counseled on seeking medical attention after a head injury or if there is blood in the urine/stool Afib action plan reviewed Screening for signs and symptoms of depression related to chronic disease state  Assessed social determinant of health barriers Completed Hospital Follow up with Cardiologist Robet Leu on  Monday 11/20/23   COPD Interventions:  (Status:  Ongoing) Short Term Goal Advised patient to track and manage COPD triggers Advised patient to self assesses COPD action plan zone and make appointment with provider if in the yellow zone for 48 hours without improvement Advised patient to engage in light exercise as tolerated 3-5 days a week to aid in the the management of COPD Provided education about and advised patient to utilize infection prevention strategies to reduce risk of respiratory infection Discussed the importance of adequate rest and management of fatigue with COPD 11/17/23 tele-visit w/ RNCM Patient reported meeting with his new PCP, Dr. Constance Goltz on 11/16/23 to review his recent hospitalization for acute respiratory failure which has been resolved. He also brought up his recent flare up of Arthritis and PCP referred him to Rheumatologist, Dr. Sheliah Hatch to discuss best options in treatment and prescribed a steroid taper to help with his current symptoms.  11/24/23 tele-visit w/ RNCM patient reported follow up appointment w/ his Cardiologist, Robet Leu PA-C. No new meds were ordered. Pt states visit went well, BP, HR, EKG within normal limits, is to follow up in 3 months. Pt also has upcoming appt with Dr. 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 Reviewed Fall Precautions, and Bleeding Precautions due to being on long term anticoagulant.  Patient Goals/Self-Care Activities: Participate in Transition of Care Program/Attend TOC scheduled calls Take all medications as prescribed Attend all scheduled  provider appointments Call pharmacy for medication refills 3-7 days in advance of running out of medications Perform all self care activities independently  Call provider office  for new concerns or questions   Follow Up Plan:  The patient has been provided with contact information for the care management team and has been advised to call with any health related questions or concerns.          Plan: The patient has been provided with contact information for the care management team and has been advised to call with any health related questions or concerns.   Delcenia Inman A. Mliss Fritz RN, BA, [facility], CRRN [facility] RN Care Manager, Transition of Care [phone]

## 2023-11-30 ENCOUNTER — Telehealth: Payer: Self-pay

## 2023-11-30 ENCOUNTER — Ambulatory Visit: Payer: Medicare Other | Admitting: Family Medicine

## 2023-11-30 NOTE — Telephone Encounter (Signed)
 Copied from CRM [id number]. Topic: Clinical - Medication Question >> Nov 30, 2023  9:07 AM Franchot Heidelberg wrote: Reason for CRM: Pt called reporting that as the prednisone tapers down it is getting harder and harder for him to get around. Says his appt with Rheumatology is 3 weeks from now. Wants to know if PCP will assist with medication needs in the meantime. Says 2 pills a day is not helping him. Originally he started on 3/4 a day and that helped him.   [id number] [place], [place] - [address] AT [facility] [address] [place] [address] Phone: [phone] Fax: [phone]

## 2023-12-01 MED ORDER — PREDNISONE 10 MG PO TABS: 30.0000 mg | ORAL_TABLET | Freq: Every day | ORAL | 0 refills | Status: AC

## 2023-12-01 NOTE — Telephone Encounter (Signed)
 Please let the patient know I sent in an additional 3 weeks of 30 mg/day of prednisone.

## 2023-12-01 NOTE — Addendum Note (Signed)
 Addended by: [name] on: 12/01/2023 06:46 AM   Modules accepted: Orders

## 2023-12-01 NOTE — Telephone Encounter (Signed)
 Pt informed of below and said he has moved up his rheum appt to Wednesday.

## 2023-12-04 ENCOUNTER — Other Ambulatory Visit: Payer: Self-pay | Admitting: *Deleted

## 2023-12-04 DIAGNOSIS — E782 Mixed hyperlipidemia: Secondary | ICD-10-CM

## 2023-12-04 DIAGNOSIS — M2550 Pain in unspecified joint: Secondary | ICD-10-CM

## 2023-12-04 DIAGNOSIS — E559 Vitamin D deficiency, unspecified: Secondary | ICD-10-CM

## 2023-12-07 ENCOUNTER — Other Ambulatory Visit: Payer: Self-pay | Admitting: *Deleted

## 2023-12-07 ENCOUNTER — Other Ambulatory Visit

## 2023-12-07 DIAGNOSIS — E559 Vitamin D deficiency, unspecified: Secondary | ICD-10-CM

## 2023-12-07 DIAGNOSIS — E782 Mixed hyperlipidemia: Secondary | ICD-10-CM

## 2023-12-07 DIAGNOSIS — M2550 Pain in unspecified joint: Secondary | ICD-10-CM

## 2023-12-08 ENCOUNTER — Other Ambulatory Visit: Payer: Self-pay | Admitting: Family Medicine

## 2023-12-12 LAB — CBC WITH DIFFERENTIAL/PLATELET
Basophils Absolute: 0 10*3/uL (ref 0.0–0.2)
Basos: 0 %
EOS (ABSOLUTE): 0 10*3/uL (ref 0.0–0.4)
Eos: 0 %
Hematocrit: 40.3 % (ref 37.5–51.0)
Hemoglobin: 13.5 g/dL (ref 13.0–17.7)
Immature Grans (Abs): 0.2 10*3/uL — ABNORMAL HIGH (ref 0.0–0.1)
Immature Granulocytes: 1 %
Lymphocytes Absolute: 0.6 10*3/uL — ABNORMAL LOW (ref 0.7–3.1)
Lymphs: 5 %
MCH: 31.3 pg (ref 26.6–33.0)
MCHC: 33.5 g/dL (ref 31.5–35.7)
MCV: 93 fL (ref 79–97)
Monocytes Absolute: 0.5 10*3/uL (ref 0.1–0.9)
Monocytes: 4 %
Neutrophils Absolute: 11.6 10*3/uL — ABNORMAL HIGH (ref 1.4–7.0)
Neutrophils: 90 %
Platelets: 295 10*3/uL (ref 150–450)
RBC: 4.32 x10E6/uL (ref 4.14–5.80)
RDW: 12.4 % (ref 11.6–15.4)
WBC: 12.8 10*3/uL — ABNORMAL HIGH (ref 3.4–10.8)

## 2023-12-12 LAB — COMPREHENSIVE METABOLIC PANEL WITH GFR
ALT: 15 IU/L (ref 0–44)
AST: 17 IU/L (ref 0–40)
Albumin: 3.5 g/dL — ABNORMAL LOW (ref 3.8–4.8)
Alkaline Phosphatase: 80 IU/L (ref 44–121)
BUN/Creatinine Ratio: 18 (ref 10–24)
BUN: 19 mg/dL (ref 8–27)
Bilirubin Total: 0.5 mg/dL (ref 0.0–1.2)
CO2: 21 mmol/L (ref 20–29)
Calcium: 9 mg/dL (ref 8.6–10.2)
Chloride: 100 mmol/L (ref 96–106)
Creatinine, Ser: 1.08 mg/dL (ref 0.76–1.27)
Globulin, Total: 2.4 g/dL (ref 1.5–4.5)
Glucose: 116 mg/dL — ABNORMAL HIGH (ref 70–99)
Potassium: 4.6 mmol/L (ref 3.5–5.2)
Sodium: 136 mmol/L (ref 134–144)
Total Protein: 5.9 g/dL — ABNORMAL LOW (ref 6.0–8.5)
eGFR: 72 mL/min/{1.73_m2} (ref >59)

## 2023-12-12 LAB — LIPID PANEL
Chol/HDL Ratio: 2.4 ratio (ref 0.0–5.0)
Cholesterol, Total: 170 mg/dL (ref 100–199)
HDL: 70 mg/dL (ref >39)
LDL Chol Calc (NIH): 87 mg/dL (ref 0–99)
Triglycerides: 67 mg/dL (ref 0–149)
VLDL Cholesterol Cal: 13 mg/dL (ref 5–40)

## 2023-12-12 LAB — RHEUMATOID FACTOR: Rheumatoid fact SerPl-aCnc: 118.8 [IU]/mL — ABNORMAL HIGH (ref <14.0)

## 2023-12-12 LAB — VITAMIN D 25 HYDROXY (VIT D DEFICIENCY, FRACTURES): Vit D, 25-Hydroxy: 29 ng/mL — ABNORMAL LOW (ref 30.0–100.0)

## 2023-12-12 LAB — ANA W/REFLEX IF POSITIVE: Anti Nuclear Antibody (ANA): NEGATIVE

## 2023-12-12 LAB — ANTI-CCP AB, IGG + IGA (RDL): Anti-CCP Ab, IgG + IgA (RDL): <20 U (ref <20)

## 2023-12-12 LAB — C-REACTIVE PROTEIN: CRP: 57 mg/L — ABNORMAL HIGH (ref 0–10)

## 2023-12-12 LAB — SEDIMENTATION RATE: Sed Rate: 79 mm/h — ABNORMAL HIGH (ref 0–30)

## 2023-12-12 LAB — URIC ACID: Uric Acid: 6.8 mg/dL (ref 3.8–8.4)

## 2023-12-14 ENCOUNTER — Encounter: Payer: Self-pay | Admitting: Family Medicine

## 2023-12-14 ENCOUNTER — Ambulatory Visit (INDEPENDENT_AMBULATORY_CARE_PROVIDER_SITE_OTHER): Admitting: Family Medicine

## 2023-12-14 VITALS — BP 122/83 | HR 85 | Ht 69.0 in | Wt 198.4 lb

## 2023-12-14 DIAGNOSIS — M353 Polymyalgia rheumatica: Secondary | ICD-10-CM | POA: Insufficient documentation

## 2023-12-14 DIAGNOSIS — M069 Rheumatoid arthritis, unspecified: Secondary | ICD-10-CM | POA: Insufficient documentation

## 2023-12-14 DIAGNOSIS — I482 Chronic atrial fibrillation, unspecified: Secondary | ICD-10-CM | POA: Diagnosis not present

## 2023-12-14 DIAGNOSIS — J4489 Other specified chronic obstructive pulmonary disease: Secondary | ICD-10-CM

## 2023-12-14 DIAGNOSIS — M0579 Rheumatoid arthritis with rheumatoid factor of multiple sites without organ or systems involvement: Secondary | ICD-10-CM | POA: Diagnosis not present

## 2023-12-14 NOTE — Assessment & Plan Note (Signed)
 Managed by rheumatology.  Is now starting on Plaquenil and tapering off prednisone.

## 2023-12-14 NOTE — Progress Notes (Signed)
   Established Patient Office Visit  Subjective   Patient ID: Christopher Peck, male    DOB: Mar 23, 1946  Age: 78 y.o. MRN: [id number]  Chief Complaint  Patient presents with   Medical Management of Chronic Issues    HPI  Joint pain -patient has seen the rheumatologist yesterday.  They started on Plaquenil.  They are also continuing a taper of the prednisone.  He says is about 75% better.  Atrial flutter-patient saw the cardiologist since last time I have seen him.  He is continuing Eliquis.  Is not interested in cardioversion.  Not interested in Lasix for mild leg swelling.  COPD-patient takes albuterol as needed.  Feels like his breathing is still better with the steroids.  States he has been tried on "4 different" maintenance inhaler medications that he was allergic to.   The 10-year ASCVD risk score (Arnett DK, et al., 2019) is: 19.7%  Health Maintenance Due  Topic Date Due   COVID-19 Vaccine (1) Never done   Zoster Vaccines- Shingrix (1 of 2) Never done   Medicare Annual Wellness (AWV)  09/28/2023      Objective:     BP 122/83   Pulse 85   Ht 5\' 9"  (1.753 m)   Wt 198 lb 6.4 oz (90 kg)   SpO2 93%   BMI 29.30 kg/m    Physical Exam General: Alert, oriented CV: Irregularly irregular rhythm.  Normal rate.  No murmurs Pulmonary: Lungs clear bilaterally no wheezes no crackles MSK: Gait is less antalgic today than previous visit.   No results found for any visits on 12/14/23.      Assessment & Plan:   PMR (polymyalgia rheumatica) (HCC) Assessment & Plan: Managed by rheumatology.  Is now starting on Plaquenil and tapering off prednisone.   Rheumatoid arthritis involving multiple sites with positive rheumatoid factor (HCC) Assessment & Plan: Positive RF > 100.  Has just started on Plaquenil and is tapering off prednisone.  Follows with rheumatology   Chronic atrial fibrillation [facility]) Assessment & Plan: On Eliquis.  Follows with cardiology.  Declines  cardioversion.   COPD Assessment & Plan: Takes albuterol as needed.  Has nebulizer.  Has not tolerated maintenance inhalers in the past.      Return in about 4 months (around 04/14/2024) for A-fib, joint pain, hld.    [name], MD

## 2023-12-14 NOTE — Assessment & Plan Note (Signed)
 Positive RF > 100.  Has just started on Plaquenil and is tapering off prednisone.  Follows with rheumatology

## 2023-12-14 NOTE — Patient Instructions (Signed)
 It was nice to see you today,  We addressed the following topics today: We are not make any changes to your medication today. - You can see [place] back in 4 months.  You can schedule something sooner if you have any issues before then.   Have a great day,  Frederic Jericho, MD

## 2023-12-14 NOTE — Assessment & Plan Note (Signed)
 Takes albuterol as needed.  Has nebulizer.  Has not tolerated maintenance inhalers in the past.

## 2023-12-14 NOTE — Assessment & Plan Note (Signed)
 On Eliquis.  Follows with cardiology.  Declines cardioversion.

## 2024-01-03 ENCOUNTER — Ambulatory Visit: Payer: Medicare Other | Admitting: Internal Medicine

## 2024-01-16 ENCOUNTER — Other Ambulatory Visit: Payer: Self-pay | Admitting: Cardiology

## 2024-01-16 DIAGNOSIS — J4520 Mild intermittent asthma, uncomplicated: Secondary | ICD-10-CM

## 2024-01-16 MED ORDER — APIXABAN 5 MG PO TABS: 5.0000 mg | ORAL_TABLET | Freq: Two times a day (BID) | ORAL | 1 refills | Status: DC

## 2024-01-16 NOTE — Telephone Encounter (Signed)
 Copied from CRM [id number]. Topic: Clinical - Medication Refill >> Jan 16, 2024 11:42 AM Lotus Round B wrote: Medication: albuterol  (VENTOLIN  HFA) 108 (90 Base) MCG/ACT inhaler  Has the patient contacted their pharmacy? Yes (Agent: If no, request that the patient contact the pharmacy for the refill. If patient does not wish to contact the pharmacy document the reason why and proceed with request.) (Agent: If yes, when and what did the pharmacy advise?)  This is the patient's preferred pharmacy:   [id number] - [place], [place] - [address] AT [facility] [address] [name] [address] Phone: [phone] Fax: [phone]   Is this the correct pharmacy for this prescription? Yes If no, delete pharmacy and type the correct one.   Has the prescription been filled recently? No  Is the patient out of the medication? No  Has the patient been seen for an appointment in the last year OR does the patient have an upcoming appointment? Yes  Can we respond through MyChart? Yes  Agent: Please be advised that Rx refills may take up to 3 business days. We ask that you follow-up with your pharmacy.

## 2024-01-16 NOTE — Telephone Encounter (Signed)
 Prescription refill request for Eliquis  received. Indication: A Flutter Last office visit: 11/20/23  K Johnson PA-C Scr: 1.08 on 12/07/23  Epic Age: 78 Weight: 92.5kg  Based on above findings Eliquis  5mg  twice daily is the appropriate dose.  Refill approved.

## 2024-01-16 NOTE — Telephone Encounter (Signed)
*  STAT* If patient is at the pharmacy, call can be transferred to refill team.   1. Which medications need to be refilled? (please list name of each medication and dose if known)   apixaban  (ELIQUIS ) 5 MG TABS tablet   2. Would you like to learn more about the convenience, safety, & potential cost savings by using the [facility]?   3. Are you open to using the [facility] (Type [facility]. ).  4. Which pharmacy/location (including street and city if local pharmacy) is medication to be sent to?  [id number] - [place], [place] - [address] AT [address] [facility]   5. Do they need a 30 day or 90 day supply?   90 day  Patient stated he still has some medication.

## 2024-01-16 NOTE — Addendum Note (Signed)
 Addended by: Eleazar Grief on: 01/16/2024 04:25 PM   Modules accepted: Orders

## 2024-01-16 NOTE — Addendum Note (Signed)
 Addended by: ZIMMERMAN RUMPLE, Helayne Metsker D on: 01/16/2024 06:38 PM   Modules accepted: Orders

## 2024-01-17 MED ORDER — ALBUTEROL SULFATE HFA 108 (90 BASE) MCG/ACT IN AERS: 2.0000 | INHALATION_SPRAY | RESPIRATORY_TRACT | 5 refills | Status: DC | PRN

## 2024-01-25 ENCOUNTER — Ambulatory Visit (INDEPENDENT_AMBULATORY_CARE_PROVIDER_SITE_OTHER)

## 2024-01-25 DIAGNOSIS — Z0000 Encounter for general adult medical examination without abnormal findings: Secondary | ICD-10-CM | POA: Diagnosis not present

## 2024-01-25 NOTE — Progress Notes (Signed)
 Subjective:   Christopher Peck is a 78 y.o. who presents for a Medicare Wellness preventive visit.  As a reminder, Annual Wellness Visits don't include a physical exam, and some assessments may be limited, especially if this visit is performed virtually. We may recommend an in-person follow-up visit with your provider if needed.  Visit Complete: Virtual I connected with  Ferna How on 01/25/24 by a video and audio enabled telemedicine application and verified that I am speaking with the correct person using two identifiers.  Patient Location: Home  Provider Location: Home Office  I discussed the limitations of evaluation and management by telemedicine. The patient expressed understanding and agreed to proceed.  Vital Signs: Because this visit was a virtual/telehealth visit, some criteria may be missing or patient reported. Any vitals not documented were not able to be obtained and vitals that have been documented are patient reported.    Persons Participating in Visit: Patient.  AWV Questionnaire: No: Patient Medicare AWV questionnaire was not completed prior to this visit.  Cardiac Risk Factors include: advanced age (>40men, >78 women);dyslipidemia;male gender;hypertension     Objective:     Today's Vitals   01/25/24 1119  PainSc: 2    There is no height or weight on file to calculate BMI.     01/25/2024   11:25 AM 10/31/2023    6:00 AM 10/30/2023    6:05 PM 09/27/2022    2:40 PM 01/19/2022   10:53 AM 12/09/2020   12:10 PM 11/25/2020    4:26 PM  Advanced Directives  Does Patient Have a Medical Advance Directive? Yes No No Yes Yes Yes No  Type of Advance Directive Out of facility DNR (pink MOST or yellow form)   Healthcare Power of [place];Living will Healthcare Power of [place];Living will Living will;Healthcare Power of Attorney   Does patient want to make changes to medical advance directive?    No - Patient declined No - Patient declined No - Patient declined    Copy of Healthcare Power of Attorney in Chart?    No - copy requested No - copy requested No - copy requested   Would patient like information on creating a medical advance directive?  No - Patient declined    No - Patient declined No - Patient declined    Current Medications (verified) Outpatient Encounter Medications as of 01/25/2024  Medication Sig   albuterol  (ACCUNEB ) 1.25 MG/3ML nebulizer solution USE 1 VIAL VIA NEBULIZER EVERY 6 HOURS AS NEEDED FOR WHEEZING (Patient taking differently: Take 1 ampule by nebulization every 6 (six) hours as needed for wheezing or shortness of breath.)   albuterol  (VENTOLIN  HFA) 108 (90 Base) MCG/ACT inhaler Inhale 2 puffs into the lungs every 4 (four) hours as needed for wheezing or shortness of breath. Use  2 inhalations  15 minutes apart  every 4 hours  as needed to Rescue Asthma Attack   apixaban  (ELIQUIS ) 5 MG TABS tablet Take 1 tablet (5 mg total) by mouth 2 (two) times daily.   citalopram  (CELEXA ) 20 MG tablet TAKE 1 TABLET BY MOUTH EVERY DAY FOR MOOD (Patient taking differently: Take 20 mg by mouth daily. TAKE 1 TABLET BY MOUTH EVERY DAY FOR MOOD)   finasteride  (PROSCAR ) 5 MG tablet TAKE 1 TABLET (5 MG TOTAL) BY MOUTH DAILY.   hydroxychloroquine (PLAQUENIL) 200 MG tablet 2 tab Orally daily for 90   levothyroxine  (SYNTHROID ) 125 MCG tablet TAKE 1 TABLET DAILY ON AN EMPTY STOMACH WITH WATER FOR 30 MINUTES &  NO ANTACID MEDS, CALCIUM OR MAGNESIUM  FOR 4 HOURS & AVOID BIOTIN (Patient taking differently: Take 62.5-125 mcg by mouth See admin instructions. Take 1 tablet Monday, Wednesday, Friday and 1/2 tablet on Tuesday, Thursday, Saturday, and Sunday)   pantoprazole (PROTONIX) 40 MG tablet TAKE 1 TABLET BY MOUTH EVERY DAY   predniSONE (DELTASONE) 10 MG tablet Take by mouth.   No facility-administered encounter medications on file as of 01/25/2024.    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   Emphysema of lung (HCC)    History of bilateral inguinal hernia repair 05/11/2009   Hypertension    Seizures (HCC) 11/30/2020   Vitamin D deficiency 01/15/2014   Past Surgical History:  Procedure Laterality Date   AORTIC ARCH ANGIOGRAPHY N/A 12/09/2020   Procedure: AORTIC ARCH ANGIOGRAPHY;  Surgeon: Hawken, Thomas N, MD;  Location: [facility] INVASIVE CV LAB;  Service: Cardiovascular;  Laterality: N/A;   CATARACT EXTRACTION, BILATERAL     08 /21, 09/21   HERNIA REPAIR     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   Procedure: PERIPHERAL VASCULAR INTERVENTION;  Surgeon: Carlene Che, MD;  Location: [facility] INVASIVE CV LAB;  Service: Cardiovascular;  Laterality: Left;  LCCA   Family History  Problem Relation Age of Onset   COPD Father    Kidney failure Father    Social History   Socioeconomic History   Marital status: Married    Spouse name: Not on file   Number of children: Not on file   Years of education: Not on file   Highest education level: Not on file  Occupational History   Not on file  Tobacco Use   Smoking status: Former    Current packs/day: 0.00    Types: Cigarettes    Quit date: 09/05/2004    Years since quitting: 19.4   Smokeless tobacco: Never  Vaping Use   Vaping status: Never Used  Substance and Sexual Activity   Alcohol use: Not Currently    Alcohol/week: 3.0 standard drinks of alcohol    Types: 3 Standard drinks or equivalent per week    Comment: none   Drug use: Never   Sexual activity: Not on file  Other Topics Concern   Not on file  Social History Narrative   Right handed   Caffeine use:  1 cup coffee and tea per day   Lives with spouse, Sherline Distel.   Social Drivers of Corporate investment banker Strain: Low Risk  (01/25/2024)   Overall Financial Resource Strain (CARDIA)    Difficulty of Paying Living Expenses: Not hard  at all  Food Insecurity: No Food Insecurity (01/25/2024)   Hunger Vital Sign    Worried About Running Out of Food in the Last Year: Never true    Ran Out of Food in the Last Year: Never true  Transportation Needs: No Transportation Needs (01/25/2024)   PRAPARE - Administrator, Civil Service (Medical): No    Lack of Transportation (Non-Medical): No  Physical Activity: Inactive (01/25/2024)   Exercise Vital Sign    Days of Exercise per Week: 0 days    Minutes of Exercise per Session: 0 min  Stress: No Stress Concern Present (01/25/2024)   [employer] of Occupational Health - Occupational Stress Questionnaire    Feeling of Stress : Not at all  Social Connections: Socially Isolated (01/25/2024)  Social Advertising account executive [NHANES]    Frequency of Communication with Friends and Family: Once a week    Frequency of Social Gatherings with Friends and Family: Never    Attends Religious Services: Never    Diplomatic Services operational officer: No    Attends Engineer, structural: Never    Marital Status: Married    Tobacco Counseling Counseling given: Not Answered    Clinical Intake:  Pre-visit preparation completed: Yes  Pain : 0-10 Pain Score: 2  Pain Type: Chronic pain Pain Location: Generalized Pain Descriptors / Indicators: Aching Pain Onset: 1 to 4 weeks ago Pain Frequency: Constant     Nutritional Risks: None Diabetes: No  Lab Results  Component Value Date   HGBA1C 5.7 (H) 07/03/2023   HGBA1C 5.6 01/02/2023   HGBA1C 5.5 06/27/2022     How often do you need to have someone help you when you read instructions, pamphlets, or other written materials from your doctor or pharmacy?: 1 - Never  Interpreter Needed?: No  Information entered by :: NAllen LPN   Activities of Daily Living     01/25/2024   11:20 AM 10/31/2023    6:00 AM  In your present state of health, do you have any difficulty performing the following activities:   Hearing? 0 0  Vision? 0 0  Difficulty concentrating or making decisions? 0 0  Walking or climbing stairs? 0   Dressing or bathing? 0   Doing errands, shopping? 0 0  Preparing Food and eating ? N   Using the Toilet? N   In the past six months, have you accidently leaked urine? N   Do you have problems with loss of bowel control? N   Managing your Medications? N   Managing your Finances? N   Housekeeping or managing your Housekeeping? N     Patient Care Team: Laneta Pintos, MD as PCP - General (Family Medicine) Amanda Jungling Tomas Fountain, MD as PCP - Cardiology (Cardiology) Marlene Simas, MD as Consulting Physician (Oncology) Albert Huff, MD as Consulting Physician (Ophthalmology) [facility]  Indicate any recent Medical Services you may have received from other than [facility] providers in the past year (date may be approximate).     Assessment:    This is a routine wellness examination for Ethaniel.  Hearing/Vision screen Hearing Screening - Comments:: Denies hearing issues Vision Screening - Comments:: Regular eye exams, Dr. Roslynn Coombes   Goals Addressed             This Visit's Progress    Patient Stated       01/25/2024, wants to get rheumatoid arthritis under control       Depression Screen     01/25/2024   11:27 AM 12/14/2023    3:45 PM 11/16/2023    3:09 PM 01/02/2023    8:09 PM 09/27/2022    2:41 PM 08/30/2022    8:40 PM 01/19/2022   10:55 AM  PHQ 2/9 Scores  PHQ - 2 Score 0 3 4 0 0 0 0  PHQ- 9 Score 3 6 4         Fall Risk     01/25/2024   11:26 AM 01/02/2023    8:09 PM 09/27/2022    2:41 PM 08/30/2022    8:39 PM 06/26/2022   10:13 PM  Fall Risk   Falls in the past year? 0 0 0 0 0  Number falls in past yr: 0  0  Injury with Fall? 0  0    Risk for fall due to : Medication side effect  Other (Comment) No Fall Risks No Fall Risks  Risk for fall due to: Comment   respiratory- COPD    Follow up Falls evaluation completed;Falls prevention  discussed  Falls evaluation completed;Follow up appointment Education provided;Falls prevention discussed;Falls evaluation completed Falls prevention discussed;Education provided;Falls evaluation completed    MEDICARE RISK AT HOME:  Medicare Risk at Home Any stairs in or around the home?: Yes If so, are there any without handrails?: No Home free of loose throw rugs in walkways, pet beds, electrical cords, etc?: Yes Adequate lighting in your home to reduce risk of falls?: Yes Life alert?: No Use of a cane, walker or w/c?: No Grab bars in the bathroom?: Yes Shower chair or bench in shower?: No Elevated toilet seat or a handicapped toilet?: No  TIMED UP AND GO:  Was the test performed?  No  Cognitive Function: 6CIT completed        01/25/2024   11:28 AM  6CIT Screen  What Year? 0 points  What month? 0 points  What time? 0 points  Count back from 20 0 points  Months in reverse 4 points  Repeat phrase 0 points  Total Score 4 points    Immunizations Immunization History  Administered Date(s) Administered   Influenza, High Dose Seasonal PF 07/21/2014, 06/19/2018, 05/23/2019, 06/22/2020, 06/27/2022   Influenza-Unspecified 06/24/2015, 06/07/2017, 06/24/2021   PNEUMOCOCCAL CONJUGATE-20 06/27/2022   Pneumococcal Conjugate-13 06/24/2015   Pneumococcal Polysaccharide-23 01/03/2012, 08/23/2017   Td 01/03/2012    Screening Tests Health Maintenance  Topic Date Due   COVID-19 Vaccine (1) Never done   Zoster Vaccines- Shingrix (1 of 2) Never done   INFLUENZA VACCINE  04/05/2024   Medicare Annual Wellness (AWV)  01/24/2025   Pneumonia Vaccine 31+ Years old  Completed   HPV VACCINES  Aged Out   Meningococcal B Vaccine  Aged Out   DTaP/Tdap/Td  Discontinued   Colonoscopy  Discontinued   Hepatitis C Screening  Discontinued    Health Maintenance  Health Maintenance Due  Topic Date Due   COVID-19 Vaccine (1) Never done   Zoster Vaccines- Shingrix (1 of 2) Never done    Health Maintenance Items Addressed: Declines covid and shingles vaccine.  Additional Screening:  Vision Screening: Recommended annual ophthalmology exams for early detection of glaucoma and other disorders of the eye.  Dental Screening: Recommended annual dental exams for proper oral hygiene  Community Resource Referral / Chronic Care Management: CRR required this visit?  No   CCM required this visit?  No   Plan:    I have personally reviewed and noted the following in the patient's chart:   Medical and social history Use of alcohol, tobacco or illicit drugs  Current medications and supplements including opioid prescriptions. Patient is not currently taking opioid prescriptions. Functional ability and status Nutritional status Physical activity Advanced directives List of other physicians Hospitalizations, surgeries, and ER visits in previous 12 months Vitals Screenings to include cognitive, depression, and falls Referrals and appointments  In addition, I have reviewed and discussed with patient certain preventive protocols, quality metrics, and best practice recommendations. A written personalized care plan for preventive services as well as general preventive health recommendations were provided to patient.   Areatha Beecham, LPN   [id number]/72/5366   After Visit Summary: (MyChart) Due to this being a telephonic visit, the after visit summary with patients personalized plan was offered to  patient via MyChart   Notes: Nothing significant to report at this time.

## 2024-01-25 NOTE — Patient Instructions (Signed)
 Christopher Peck , Thank you for taking time out of your busy schedule to complete your Annual Wellness Visit with me. I enjoyed our conversation and look forward to speaking with you again next year. I, as well as your care team,  appreciate your ongoing commitment to your health goals. Please review the following plan we discussed and let me know if I can assist you in the future. Your Game plan/ To Do List    Referrals: If you haven't heard from the office you've been referred to, please reach out to them at the phone provided.  N/a Follow up Visits: Next Medicare AWV with our clinical staff: 02/13/2025 at 11:30   Have you seen your provider in the last 6 months (3 months if uncontrolled diabetes)? Yes Next Office Visit with your provider: 04/15/2024 at 8:10  Clinician Recommendations:  Aim for 30 minutes of exercise or brisk walking, 6-8 glasses of water, and 5 servings of fruits and vegetables each day.       This is a list of the screening recommended for you and due dates:  Health Maintenance  Topic Date Due   COVID-19 Vaccine (1) Never done   Zoster (Shingles) Vaccine (1 of 2) Never done   Flu Shot  04/05/2024   Medicare Annual Wellness Visit  01/24/2025   Pneumonia Vaccine  Completed   HPV Vaccine  Aged Out   Meningitis B Vaccine  Aged Out   DTaP/Tdap/Td vaccine  Discontinued   Colon Cancer Screening  Discontinued   Hepatitis C Screening  Discontinued    Advanced directives: (In Chart) A copy of your advanced directives are scanned into your chart should your provider ever need it. Advance Care Planning is important because it:  [x]  Makes sure you receive the medical care that is consistent with your values, goals, and preferences  [x]  It provides guidance to your family and loved ones and reduces their decisional burden about whether or not they are making the right decisions based on your wishes.  Follow the link provided in your after visit summary or read over the paperwork  we have mailed to you to help you started getting your Advance Directives in place. If you need assistance in completing these, please reach out to us  so that we can help you!  See attachments for Preventive Care and Fall Prevention Tips.

## 2024-03-04 ENCOUNTER — Other Ambulatory Visit: Payer: Self-pay | Admitting: Family Medicine

## 2024-03-14 NOTE — Progress Notes (Unsigned)
 Cardiology Office Note   Date:  03/28/2024  ID:  Christopher Peck, DOB June 10, 1946, MRN [id number] PCP: Chandra Toribio POUR, MD  [facility] Providers Cardiologist:  Madonna Large, DO   History of Present Illness Christopher Peck is a 78 y.o. male with a past medical history of radiation fibrosis of the lung, history of CVA, left carotid artery thrombosis, left-sided pleural effusion in 10/2023, atrial fibrillation, COPD, BPH, HTN, depression. Patient is followed by Dr. Alvan and presents today for a follow up appointment for atrial fibrillation    Patient was admitted 2/24-2/27/25. He had presented to the ED complaining of shortness of breath, wheezing, and cough. In the ED, BNP 237. COVID, Flu, RSV negative. CXR showed a new, moderate sized left pleural effusion. He was admitted with acute respiratory failure due to pleural effusion, COPD, community acquired pneumonia. IR was consulted and patient underwent thoracentesis that yielded 180 mL fluid. Pleural fluid gram stain showed no organisms. While admitted, patient went into atrial flutter, HR was well controlled without rate controlling medications. Echocardiogram 11/01/23 showed EF 60-65%, no regional wall motion abnormalities, normal RV function, no significant valvular abnormalities. HE was discharged on eliquis .   I saw him in clinic for follow up 3/17. At that time, patient was feeling well. Breathing had improved after a course of steroids.  He denied chest pain, palpitations.  Had some mild swelling in his ankles but declined Lasix.  Did not have any bleeding on Eliquis .  Discussed cardioversion as he had been on Eliquis  for 3+ weeks without interruption or missed doses, patient declined.  Reported that his atrial flutter did not bother him very much  Today, patient reports that he has been doing well from a cardiac perspective and has no concerns or complaints.  He does have some bruising on Eliquis  but denies any bleeding.  He does not  have any blood in his urine or stool.  Reports that he was an easy bleeder before starting Eliquis  and now it has worsened a bit.  He denies any chest pain, palpitations.  He checks his heart rate and it is often in the 50s-60s.  Denies dizziness, syncope, near syncope.  He has some baseline shortness of breath due to his history of COPD.  Breathing improves with inhalers.  He is also currently on prednisone  for his rheumatoid arthritis and that has helped his breathing significantly.  Denies orthopnea, lower extremity swelling.  He is not interested in cardioversion at this time as his A-fib is not bothering him.  He is not interested in starting cholesterol medication at this time.  Studies Reviewed  Cardiac Studies & Procedures   ______________________________________________________________________________________________     ECHOCARDIOGRAM  ECHOCARDIOGRAM COMPLETE 11/01/2023  Narrative ECHOCARDIOGRAM REPORT    Patient Name:   Christopher Peck Date of Exam: 11/01/2023 Medical Rec #:  [id number]       Height:       69.0 in Accession #:    [id number]      Weight:       204.6 lb Date of Birth:  04-10-1946       BSA:          2.086 m Patient Age:    77 years        BP:           142/91 mmHg Patient Gender: M               HR:  70 bpm. Exam Location:  Inpatient  Procedure: 2D Echo, Cardiac Doppler, Color Doppler and Intracardiac Opacification Agent (Both Spectral and Color Flow Doppler were utilized during procedure).  Indications:    Atrial Fibrillation I48.91  History:        Patient has no prior history of Echocardiogram examinations. Arrythmias:Atrial Fibrillation; Signs/Symptoms:Hypotension.  Sonographer:    Lanell Maduro Referring Phys: [id number] DAVID MANUEL ORTIZ  IMPRESSIONS   1. Left ventricular ejection fraction, by estimation, is 60 to 65%. The left ventricle has normal function. The left ventricle has no regional wall motion abnormalities. Left ventricular  diastolic function could not be evaluated. 2. Right ventricular systolic function is normal. The right ventricular size is normal. 3. Left atrial size was mildly dilated. 4. Right atrial size was mildly dilated. 5. The mitral valve is normal in structure. No evidence of mitral valve regurgitation. No evidence of mitral stenosis. 6. The aortic valve is tricuspid. There is mild calcification of the aortic valve. Aortic valve regurgitation is not visualized. No aortic stenosis is present. 7. The inferior vena cava is normal in size with greater than 50% respiratory variability, suggesting right atrial pressure of 3 mmHg.  FINDINGS Left Ventricle: Left ventricular ejection fraction, by estimation, is 60 to 65%. The left ventricle has normal function. The left ventricle has no regional wall motion abnormalities. Definity  contrast agent was given IV to delineate the left ventricular endocardial borders. Strain imaging was not performed. The left ventricular internal cavity size was normal in size. There is no left ventricular hypertrophy. Left ventricular diastolic function could not be evaluated due to atrial fibrillation. Left ventricular diastolic function could not be evaluated.  Right Ventricle: The right ventricular size is normal. No increase in right ventricular wall thickness. Right ventricular systolic function is normal.  Left Atrium: Left atrial size was mildly dilated.  Right Atrium: Right atrial size was mildly dilated.  Pericardium: There is no evidence of pericardial effusion.  Mitral Valve: The mitral valve is normal in structure. No evidence of mitral valve regurgitation. No evidence of mitral valve stenosis.  Tricuspid Valve: The tricuspid valve is normal in structure. Tricuspid valve regurgitation is trivial. No evidence of tricuspid stenosis.  Aortic Valve: The aortic valve is tricuspid. There is mild calcification of the aortic valve. Aortic valve regurgitation is not  visualized. No aortic stenosis is present.  Pulmonic Valve: The pulmonic valve was normal in structure. Pulmonic valve regurgitation is not visualized. No evidence of pulmonic stenosis.  Aorta: The aortic root is normal in size and structure.  Venous: The inferior vena cava is normal in size with greater than 50% respiratory variability, suggesting right atrial pressure of 3 mmHg.  IAS/Shunts: No atrial level shunt detected by color flow Doppler.  Additional Comments: 3D imaging was not performed.   LEFT VENTRICLE PLAX 2D LVIDd:         4.00 cm      Diastology LVIDs:         2.30 cm      LV e' medial:    10.80 cm/s LV PW:         0.90 cm      LV E/e' medial:  9.6 LV IVS:        1.10 cm      LV e' lateral:   11.90 cm/s LVOT diam:     2.00 cm      LV E/e' lateral: 8.7 LV SV:         53 LV SV Index:  26 LVOT Area:     3.14 cm  LV Volumes (MOD) LV vol d, MOD A2C: 116.0 ml LV vol d, MOD A4C: 110.0 ml LV vol s, MOD A2C: 49.1 ml LV vol s, MOD A4C: 49.9 ml LV SV MOD A2C:     66.9 ml LV SV MOD A4C:     110.0 ml LV SV MOD BP:      62.8 ml  RIGHT VENTRICLE             IVC RV Basal diam:  3.60 cm     IVC diam: 1.60 cm RV S prime:     14.10 cm/s TAPSE (M-mode): 2.1 cm  LEFT ATRIUM             Index        RIGHT ATRIUM           Index LA diam:        4.20 cm 2.01 cm/m   RA Area:     17.20 cm LA Vol (A2C):   52.4 ml 25.12 ml/m  RA Volume:   41.00 ml  19.65 ml/m LA Vol (A4C):   58.5 ml 28.04 ml/m LA Biplane Vol: 56.4 ml 27.04 ml/m AORTIC VALVE LVOT Vmax:   97.63 cm/s LVOT Vmean:  58.700 cm/s LVOT VTI:    0.170 m  AORTA Ao Root diam: 2.80 cm Ao Asc diam:  3.60 cm  MITRAL VALVE                TRICUSPID VALVE MV Area (PHT): 3.99 cm     TR Peak grad:   17.3 mmHg MV Decel Time: 190 msec     TR Vmax:        208.00 cm/s MV E velocity: 103.70 cm/s MV A velocity: 47.20 cm/s   SHUNTS MV E/A ratio:  2.20         Systemic VTI:  0.17 m Systemic Diam: 2.00 cm  Toribio Fuel  MD Electronically signed by Toribio Fuel MD Signature Date/Time: 11/01/2023/9:52:07 AM    Final          ______________________________________________________________________________________________       Risk Assessment/Calculations  CHA2DS2-VASc Score = 6   This indicates a 9.7% annual risk of stroke. The patient's score is based upon: CHF History: 0 HTN History: 1 Diabetes History: 0 Stroke History: 2 Vascular Disease History: 1 Age Score: 2 Gender Score: 0          Physical Exam VS:  BP 122/84   Pulse 63   Ht 5' 9 (1.753 m)   Wt 203 lb 12.8 oz (92.4 kg)   SpO2 100%   BMI 30.10 kg/m        Wt Readings from Last 3 Encounters:  03/28/24 203 lb 12.8 oz (92.4 kg)  12/14/23 198 lb 6.4 oz (90 kg)  11/20/23 204 lb (92.5 kg)    GEN: Well nourished, well developed in no acute distress. Sitting upright on the exam table  NECK: No JVD  CARDIAC: Irregular rate and rhythm, no murmurs, rubs, gallops RESPIRATORY:  Clear to auscultation without rales, wheezing or rhonchi. Normal WOB on room air   ABDOMEN: Soft, non-tender, non-distended EXTREMITIES:  No edema in BLE; No deformity   ASSESSMENT AND PLAN  Persistent Atrial Flutter / Atrial Fibrillation  - Reportedly was diagnosed with afib in the past, but he was unwilling to start anticoagulation - Found to be in atrial flutter in 10/2023 when admitted with PNA, pleural effusion. Started eliquis  at  that time for CHADS-VASc 6 - Echocardiogram from 10/2023 showed EF 60-65%, no regional wall motion abnormalities, normal RV function, no significant valvular abnormalities.  - Seen in 11/2023 and was offered cardioversion- he declined as his atrial flutter was not bothering him - HR well controlled without rate controlling medications. He denies palpitations, tachycardia at home. HR in the 50s-60s. Again we discussed cardioversion but patient not interested. No symptoms of afib  - Continue eliquis  5 mg BID. Patient denies  bleeding on eliquis . Does have some bruising but is willing to continue. Hemoglobin 13.5 in 12/2023    Coronary calcifications on CT scan  - Scattered coronary calcifications present on chest CTA from 10/2023  - Not on ASA due to eliquis  use  - Patient denies chest pain    - Echocardiogram from 10/2023 showed EF 60-65%, no wall motion abnormalities - LDL 87 in 12/2023 - discussed that his goal is less than 70, recommended starting statin therapy but patient declined. We discussed lifestyle changes including reducing intake of red meats, heavy cream, butters, fried/fatty foods.    Hypothyroidism  - On levothyroxine   - TSH within normal limits in 10/2023  - Followed by PCP    COPD  - Managed by PCP , reports that breathing has been stable    S/p left carotid artery stenting - Stenting completed 12/2020  - Carotid ultrasounds from 02/2023 showed 1-39% stenosis in bilateral ICAs - Followed by vascular surgery  - As above, patient not interested in statin therapy at this time. Not on ASA due to eliquis  use     Dispo: Follow up in 8 months with APP   Previously followed by Dr. Ronal Ross. Will set up with Dr. Michele moving forward   Signed, Rollo FABIENE Louder, PA-C

## 2024-03-18 ENCOUNTER — Telehealth: Payer: Self-pay | Admitting: Family Medicine

## 2024-03-18 NOTE — Telephone Encounter (Unsigned)
 Copied from CRM [id number]. Topic: Clinical - Medication Refill >> Mar 18, 2024 11:10 AM Jayma L wrote: Medication: citalopram  (CELEXA ) 20 MG tablet  Has the patient contacted their pharmacy? No (Agent: If no, request that the patient contact the pharmacy for the refill. If patient does not wish to contact the pharmacy document the reason why and proceed with request.) (Agent: If yes, when and what did the pharmacy advise?)  This is the patient's preferred pharmacy:  [id number] - [place], [place] - [address] AT [facility] [address] [place] [address] Phone: [phone] Fax: [phone] - send to this one please   [facility] [address] [place] [address] Phone: [phone] Fax: [phone]  Is this the correct pharmacy for this prescription? Yes If no, delete pharmacy and type the correct one.   Has the prescription been filled recently? No  Is the patient out of the medication? No  Has the patient been seen for an appointment in the last year OR does the patient have an upcoming appointment? Yes  Can we respond through MyChart? Yes  Agent: Please be advised that Rx refills may take up to 3 business days. We ask that you follow-up with your pharmacy.

## 2024-03-21 ENCOUNTER — Telehealth: Payer: Self-pay

## 2024-03-21 ENCOUNTER — Other Ambulatory Visit: Payer: Self-pay | Admitting: Family Medicine

## 2024-03-21 MED ORDER — CITALOPRAM HYDROBROMIDE 20 MG PO TABS: ORAL_TABLET | ORAL | 3 refills | Status: AC

## 2024-03-21 NOTE — Telephone Encounter (Signed)
 Copied from CRM [id number]. Topic: Clinical - Medication Question >> Mar 21, 2024  9:14 AM Cleave MATSU wrote: Reason for CRM: needs his citalopram  sent in . Dr. Chandra hard sent it in yet.

## 2024-03-28 ENCOUNTER — Encounter: Payer: Self-pay | Admitting: Cardiology

## 2024-03-28 ENCOUNTER — Ambulatory Visit: Attending: Cardiology | Admitting: Cardiology

## 2024-03-28 VITALS — BP 122/84 | HR 63 | Ht 69.0 in | Wt 203.8 lb

## 2024-03-28 DIAGNOSIS — E039 Hypothyroidism, unspecified: Secondary | ICD-10-CM

## 2024-03-28 DIAGNOSIS — I484 Atypical atrial flutter: Secondary | ICD-10-CM | POA: Diagnosis not present

## 2024-03-28 DIAGNOSIS — I2510 Atherosclerotic heart disease of native coronary artery without angina pectoris: Secondary | ICD-10-CM | POA: Diagnosis not present

## 2024-03-28 DIAGNOSIS — J4489 Other specified chronic obstructive pulmonary disease: Secondary | ICD-10-CM

## 2024-03-28 DIAGNOSIS — Z9889 Other specified postprocedural states: Secondary | ICD-10-CM

## 2024-03-28 DIAGNOSIS — Z95828 Presence of other vascular implants and grafts: Secondary | ICD-10-CM

## 2024-04-09 ENCOUNTER — Other Ambulatory Visit: Payer: Self-pay | Admitting: *Deleted

## 2024-04-09 DIAGNOSIS — E782 Mixed hyperlipidemia: Secondary | ICD-10-CM

## 2024-04-09 DIAGNOSIS — E039 Hypothyroidism, unspecified: Secondary | ICD-10-CM

## 2024-04-09 DIAGNOSIS — R7309 Other abnormal glucose: Secondary | ICD-10-CM

## 2024-04-10 ENCOUNTER — Other Ambulatory Visit

## 2024-04-10 DIAGNOSIS — E782 Mixed hyperlipidemia: Secondary | ICD-10-CM

## 2024-04-10 DIAGNOSIS — R7309 Other abnormal glucose: Secondary | ICD-10-CM

## 2024-04-10 DIAGNOSIS — E039 Hypothyroidism, unspecified: Secondary | ICD-10-CM

## 2024-04-11 LAB — HEMOGLOBIN A1C
Est. average glucose Bld gHb Est-mCnc: 120 mg/dL
Hgb A1c MFr Bld: 5.8 % — ABNORMAL HIGH (ref 4.8–5.6)

## 2024-04-11 LAB — COMPREHENSIVE METABOLIC PANEL WITH GFR
ALT: 22 IU/L (ref 0–44)
AST: 26 IU/L (ref 0–40)
Albumin: 3.6 g/dL — ABNORMAL LOW (ref 3.8–4.8)
Alkaline Phosphatase: 71 IU/L (ref 44–121)
BUN/Creatinine Ratio: 14 (ref 10–24)
BUN: 17 mg/dL (ref 8–27)
Bilirubin Total: 0.4 mg/dL (ref 0.0–1.2)
CO2: 23 mmol/L (ref 20–29)
Calcium: 9 mg/dL (ref 8.6–10.2)
Chloride: 101 mmol/L (ref 96–106)
Creatinine, Ser: 1.18 mg/dL (ref 0.76–1.27)
Globulin, Total: 2.1 g/dL (ref 1.5–4.5)
Glucose: 75 mg/dL (ref 70–99)
Potassium: 4.2 mmol/L (ref 3.5–5.2)
Sodium: 140 mmol/L (ref 134–144)
Total Protein: 5.7 g/dL — ABNORMAL LOW (ref 6.0–8.5)
eGFR: 64 mL/min/1.73 (ref >59)

## 2024-04-11 LAB — LIPID PANEL
Chol/HDL Ratio: 2.6 ratio (ref 0.0–5.0)
Cholesterol, Total: 186 mg/dL (ref 100–199)
HDL: 72 mg/dL (ref >39)
LDL Chol Calc (NIH): 94 mg/dL (ref 0–99)
Triglycerides: 113 mg/dL (ref 0–149)
VLDL Cholesterol Cal: 20 mg/dL (ref 5–40)

## 2024-04-11 LAB — TSH: TSH: 1.78 u[IU]/mL (ref 0.450–4.500)

## 2024-04-12 ENCOUNTER — Ambulatory Visit: Payer: Self-pay | Admitting: Family Medicine

## 2024-04-15 ENCOUNTER — Encounter: Payer: Self-pay | Admitting: Family Medicine

## 2024-04-15 ENCOUNTER — Ambulatory Visit (INDEPENDENT_AMBULATORY_CARE_PROVIDER_SITE_OTHER): Admitting: Family Medicine

## 2024-04-15 VITALS — BP 147/80 | HR 72 | Ht 69.0 in | Wt 209.0 lb

## 2024-04-15 DIAGNOSIS — J4489 Other specified chronic obstructive pulmonary disease: Secondary | ICD-10-CM

## 2024-04-15 DIAGNOSIS — M353 Polymyalgia rheumatica: Secondary | ICD-10-CM | POA: Diagnosis not present

## 2024-04-15 DIAGNOSIS — E782 Mixed hyperlipidemia: Secondary | ICD-10-CM

## 2024-04-15 DIAGNOSIS — R0989 Other specified symptoms and signs involving the circulatory and respiratory systems: Secondary | ICD-10-CM | POA: Diagnosis not present

## 2024-04-15 NOTE — Patient Instructions (Addendum)
 It was nice to see you today,  We addressed the following topics today: -No changes to your medications today. - If you decide you would like to start a medication for cholesterol, we can send Zetia into a online pharmacy [facility] for less than $10 a month. - Please use the sheet we provided you check your blood pressure at home twice a day for the next week and then return those values to us  so that we can put them in your chart.  Have a great day,  Rolan Slain, MD

## 2024-04-15 NOTE — Progress Notes (Signed)
   Established Patient Office Visit  Subjective   Patient ID: Christopher Peck, male    DOB: [birth year]  Age: 78 y.o. MRN: [id number]  Chief Complaint  Patient presents with   Medical Management of Chronic Issues    HPI  Subjective - Worsening shortness of breath due to poor air quality from smoke. Using albuterol  puffer twice daily. Required nebulizer for the past two days, which had not been used since March. Woke up with nasal congestion this morning.  Medications: Albuterol  as needed, Prednisone  50 mg daily (tapering), Hydroxychloroquine (Plaquenil).  PMH: COPD, Atrial flutter. PSH: None. FH: Not reviewed. Social Hx: Denies interest in trying new daily inhalers due to a history of multiple adverse reactions. Allergies: History of shingles-like reaction to Spiriva, reaction to Anoro, and reaction to Breztri .  Review of Systems: - Constitutional: Reports fatigue. Denies fever, chills. - ENT: Reports nasal congestion. Denies hearing changes, ear pain, sore throat. - Cardiovascular: Denies chest pain. Reports occasional dizziness on standing associated with low blood pressure. - Respiratory: Reports dyspnea and congestion. Denies cough. - Musculoskeletal: Reports joint pain has improved. - Neurological: Reports occasional dizziness on standing. Denies weakness, numbness.   The 10-year ASCVD risk score (Arnett DK, et al., 2019) is: 26.7%  Health Maintenance Due  Topic Date Due   COVID-19 Vaccine (1) Never done   Zoster Vaccines- Shingrix (1 of 2) Never done   INFLUENZA VACCINE  04/05/2024      Objective:     BP (!) 147/80   Pulse 72   Ht 5' 9 (1.753 m)   Wt 209 lb (94.8 kg)   SpO2 98%   BMI 30.86 kg/m    Physical Exam Gen: alert, oriented CV: Rate and rhythm no murmurs Pulmonary: Lungs clear bilaterally no wheeze or crackles.   No results found for any visits on 04/15/24.      Assessment & Plan:   COPD Assessment & Plan: - Worsening shortness of  breath likely triggered by environmental smoke. Currently using an albuterol  puffer twice daily and has required a nebulizer for the past two days. Also on a tapering dose of prednisone  50 mg daily for a rheumatological condition, which also helps with breathing. - Continue albuterol  as needed. - Continue current prednisone  taper as directed by rheumatology. - Advised that oral steroids are an option if symptoms worsen, but is already on a significant dose.   Hyperlipidemia, mixed Assessment & Plan: - Mildly elevated cholesterol. Discussed treatment options given the history of atrial flutter. - Offered Ezetimibe (Zetia) as a non-statin option, available at low cost via a Teacher, music.   Labile hypertension Assessment & Plan: - Elevated blood pressure in the office, but reports lower readings at home. - Provided a blood pressure log to document home readings for review.   PMR (polymyalgia rheumatica) (HCC) Assessment & Plan: Continue mgmt per rheumatology.  Pt on plaquenil, and tapering off prednisone . Sx much better.       Return in about 6 months (around 10/16/2024) for physical.    Toribio MARLA Slain, MD

## 2024-04-15 NOTE — Assessment & Plan Note (Signed)
-   Elevated blood pressure in the office, but reports lower readings at home. - Provided a blood pressure log to document home readings for review.

## 2024-04-15 NOTE — Assessment & Plan Note (Signed)
-   Worsening shortness of breath likely triggered by environmental smoke. Currently using an albuterol  puffer twice daily and has required a nebulizer for the past two days. Also on a tapering dose of prednisone  50 mg daily for a rheumatological condition, which also helps with breathing. - Continue albuterol  as needed. - Continue current prednisone  taper as directed by rheumatology. - Advised that oral steroids are an option if symptoms worsen, but is already on a significant dose.

## 2024-04-15 NOTE — Assessment & Plan Note (Signed)
-   Mildly elevated cholesterol. Discussed treatment options given the history of atrial flutter. - Offered Ezetimibe (Zetia) as a non-statin option, available at low cost via a Teacher, music.

## 2024-04-15 NOTE — Assessment & Plan Note (Signed)
 Continue mgmt per rheumatology.  Pt on plaquenil, and tapering off prednisone . Sx much better.

## 2024-05-29 ENCOUNTER — Other Ambulatory Visit: Payer: Self-pay | Admitting: Family Medicine

## 2024-06-03 ENCOUNTER — Telehealth: Payer: Self-pay | Admitting: Family Medicine

## 2024-06-03 ENCOUNTER — Other Ambulatory Visit: Payer: Self-pay | Admitting: Family Medicine

## 2024-06-03 NOTE — Telephone Encounter (Signed)
 error

## 2024-07-03 ENCOUNTER — Emergency Department (HOSPITAL_BASED_OUTPATIENT_CLINIC_OR_DEPARTMENT_OTHER): Admitting: Radiology

## 2024-07-03 ENCOUNTER — Ambulatory Visit: Payer: Self-pay

## 2024-07-03 ENCOUNTER — Encounter (HOSPITAL_BASED_OUTPATIENT_CLINIC_OR_DEPARTMENT_OTHER): Payer: Self-pay

## 2024-07-03 ENCOUNTER — Emergency Department (HOSPITAL_BASED_OUTPATIENT_CLINIC_OR_DEPARTMENT_OTHER)
Admission: EM | Admit: 2024-07-03 | Discharge: 2024-07-03 | Disposition: A | Discharge status: Home / Self Care | Attending: Emergency Medicine | Admitting: Emergency Medicine

## 2024-07-03 ENCOUNTER — Other Ambulatory Visit: Payer: Self-pay

## 2024-07-03 DIAGNOSIS — R0602 Shortness of breath: Secondary | ICD-10-CM | POA: Diagnosis present

## 2024-07-03 DIAGNOSIS — Z7901 Long term (current) use of anticoagulants: Secondary | ICD-10-CM | POA: Diagnosis not present

## 2024-07-03 DIAGNOSIS — Z85118 Personal history of other malignant neoplasm of bronchus and lung: Secondary | ICD-10-CM | POA: Insufficient documentation

## 2024-07-03 DIAGNOSIS — J441 Chronic obstructive pulmonary disease with (acute) exacerbation: Secondary | ICD-10-CM | POA: Diagnosis not present

## 2024-07-03 DIAGNOSIS — R0600 Dyspnea, unspecified: Secondary | ICD-10-CM

## 2024-07-03 LAB — BASIC METABOLIC PANEL WITH GFR
Anion gap: 12 (ref 5–15)
BUN: 15 mg/dL (ref 8–23)
CO2: 27 mmol/L (ref 22–32)
Calcium: 9.7 mg/dL (ref 8.9–10.3)
Chloride: 102 mmol/L (ref 98–111)
Creatinine, Ser: 1.11 mg/dL (ref 0.61–1.24)
GFR, Estimated: >60 mL/min (ref >60)
Glucose, Bld: 87 mg/dL (ref 70–99)
Potassium: 3.9 mmol/L (ref 3.5–5.1)
Sodium: 141 mmol/L (ref 135–145)

## 2024-07-03 LAB — CBC
HCT: 43.2 % (ref 39.0–52.0)
Hemoglobin: 14.1 g/dL (ref 13.0–17.0)
MCH: 32.3 pg (ref 26.0–34.0)
MCHC: 32.6 g/dL (ref 30.0–36.0)
MCV: 98.9 fL (ref 80.0–100.0)
Platelets: 225 K/uL (ref 150–400)
RBC: 4.37 MIL/uL (ref 4.22–5.81)
RDW: 13.1 % (ref 11.5–15.5)
WBC: 9.6 K/uL (ref 4.0–10.5)
nRBC: 0 % (ref 0.0–0.2)

## 2024-07-03 LAB — PROTIME-INR
INR: 1.1 (ref 0.8–1.2)
Prothrombin Time: 14.9 s (ref 11.4–15.2)

## 2024-07-03 MED ORDER — METHYLPREDNISOLONE SODIUM SUCC 125 MG IJ SOLR
125.0000 mg | Freq: Once | INTRAMUSCULAR | Status: AC
  Administered 2024-07-03: 125 mg via INTRAVENOUS
  Filled 2024-07-03: qty 2

## 2024-07-03 MED ORDER — IPRATROPIUM BROMIDE 0.02 % IN SOLN
0.5000 mg | RESPIRATORY_TRACT | Status: AC
  Administered 2024-07-03 (×3): 0.5 mg via RESPIRATORY_TRACT
  Filled 2024-07-03 (×3): qty 2.5

## 2024-07-03 MED ORDER — ALBUTEROL SULFATE (2.5 MG/3ML) 0.083% IN NEBU
5.0000 mg | INHALATION_SOLUTION | RESPIRATORY_TRACT | Status: AC
  Administered 2024-07-03 (×3): 5 mg via RESPIRATORY_TRACT
  Filled 2024-07-03 (×3): qty 6

## 2024-07-03 MED ORDER — PREDNISONE 20 MG PO TABS: ORAL_TABLET | ORAL | 0 refills | Status: AC

## 2024-07-03 NOTE — Discharge Instructions (Signed)
 Use albuterol  every 2-4 hours as needed for wheezing and shortness of breath.  Take increase steroid dose daily for the next 5 days and further guidance from your specialist or primary doctor after that. Return for worsening breathing or new concerns.

## 2024-07-03 NOTE — Telephone Encounter (Signed)
 FYI Only or Action Required?: FYI only for provider.  Patient was last seen in primary care on 04/15/2024 by Chandra Toribio POUR, MD.  Called Nurse Triage reporting Shortness of Breath.  Symptoms began a week ago.  Interventions attempted: Prescription medications: Nebulizer, rescue inhaler, albuterol  .  Symptoms are: gradually worsening.  Triage Disposition: Go to ED Now (Notify PCP)  Patient/caregiver understands and will follow disposition?: Yes        Copied from CRM [id number]. Topic: Clinical - Red Word Triage >> Jul 03, 2024  8:05 AM Christopher Peck wrote: Christopher Peck that prompted transfer to Nurse Triage: Patient states he is having difficulty breathing. Has COPD and not sure if he is having a flare up or what is going on. States last time it was this bad he wound up in the hospital. Reason for Disposition  [1] MODERATE difficulty breathing (e.g., speaks in phrases, SOB even at rest, pulse 100-120) AND [2] NEW-onset or WORSE than normal  Answer Assessment - Initial Assessment Questions 1. RESPIRATORY STATUS: Describe your breathing? (e.g., wheezing, shortness of breath, unable to speak, severe coughing)      Wheezing difficulty breathing and losing his breath  2. ONSET: When did this breathing problem begin?      Last week and worsening  3. PATTERN Does the difficult breathing come and go, or has it been constant since it started?      Constant  4. SEVERITY: How bad is your breathing? (e.g., mild, moderate, severe)      Moderate to Severe  5. RECURRENT SYMPTOM: Have you had difficulty breathing before? If Yes, ask: When was the last time? and What happened that time?      Yes in Feb he had respiratory failure.  6. CARDIAC HISTORY: Do you have any history of heart disease? (e.g., heart attack, angina, bypass surgery, angioplasty)      Afib  7. LUNG HISTORY: Do you have any history of lung disease?  (e.g., pulmonary embolus, asthma, emphysema)     COPD, hx of lung  cancer  8. CAUSE: What do you think is causing the breathing problem?      COPD flare up  9. OTHER SYMPTOMS: Do you have any other symptoms? (e.g., chest pain, cough, dizziness, fever, runny nose)     Cough  10. O2 SATURATION MONITOR:  Do you use an oxygen saturation monitor (pulse oximeter) at home? If Yes, ask: What is your reading (oxygen level) today? What is your usual oxygen saturation reading? (e.g., 95%)       97% Pulse  82   On 10 mg a day of prednisone . He states a side effect of one of his medications is lung problems. 3 ampules of nebulizer treatment used daily. He states he is unable to bring up mucus. Current vital signs BP 165/112 Pulse 92.  Protocols used: Breathing Difficulty-A-AH

## 2024-07-03 NOTE — ED Triage Notes (Signed)
 Pt to ED from home with c/o SOB which has been getting progressively worse over the past two weeks. Pt endorses recent changes to his COPD meds which he feels is possibly a contributing factor. Pt also reported dyspnea on exertion. Arrives A+O, VSS, Pt does have wheezing throughout.

## 2024-07-03 NOTE — ED Provider Notes (Signed)
 [name] EMERGENCY DEPARTMENT AT [facility] Provider Note   CSN: [id number] Arrival date & time: 07/03/24  9090     Patient presents with: Shortness of Breath   Christopher Peck is a 78 y.o. male.   Patient with history of pulmonary fibrosis, lung cancer, COPD, not on home oxygen presents with worsening exertional shortness of breath over the past few weeks.  Feels similar to COPD history with wheezing mild cough congestion.  Patient currently on a biologic and being weaned off prednisone  currently 10 mg a day since February.  Patient denies any fevers.  Patient denies any blood clot history, no recent surgery, no active chemo treatment, noted leg edema.  The history is provided by the patient.  Shortness of Breath Associated symptoms: cough   Associated symptoms: no abdominal pain, no chest pain, no fever, no headaches, no neck pain, no rash and no vomiting        Prior to Admission medications   Medication Sig Start Date End Date Taking? Authorizing Provider  predniSONE  (DELTASONE ) 20 MG tablet 2 tabs po daily x 4 days 07/03/24  Yes Kip Cropp, MD  albuterol  (ACCUNEB ) 1.25 MG/3ML nebulizer solution USE 1 VIAL VIA NEBULIZER EVERY 6 HOURS AS NEEDED FOR WHEEZING Patient taking differently: Take 1 ampule by nebulization every 6 (six) hours as needed for wheezing or shortness of breath. 10/04/23   Laurice President, NP  albuterol  (VENTOLIN  HFA) 108 (90 Base) MCG/ACT inhaler Inhale 2 puffs into the lungs every 4 (four) hours as needed for wheezing or shortness of breath. Use  2 inhalations  15 minutes apart  every 4 hours  as needed to Rescue Asthma Attack 01/17/24   Chandra Toribio POUR, MD  apixaban  (ELIQUIS ) 5 MG TABS tablet Take 1 tablet (5 mg total) by mouth 2 (two) times daily. 01/16/24 07/14/24  Alvan Ronal FORBES, MD  citalopram  (CELEXA ) 20 MG tablet TAKE 1 TABLET BY MOUTH EVERY DAY FOR MOOD 03/21/24   Olson, Daniel K, MD  hydroxychloroquine (PLAQUENIL) 200 MG tablet 2 tab Orally  daily for 90 12/13/23   [provider]  levothyroxine  (SYNTHROID ) 125 MCG tablet TAKE 1 TABLET DAILY ON AN EMPTY STOMACH WITH WATER FOR 30 MINUTES & NO ANTACID MEDS, CALCIUM OR MAGNESIUM  FOR 4 HOURS & AVOID BIOTIN Patient taking differently: Take 62.5-125 mcg by mouth See admin instructions. Take 1 tablet Monday, Wednesday, Friday and 1/2 tablet on Tuesday, Thursday, Saturday, and Sunday 03/02/23   Wilkinson, Dana E, NP  pantoprazole  (PROTONIX ) 40 MG tablet TAKE 1 TABLET BY MOUTH EVERY DAY 05/29/24   Chandra Toribio POUR, MD  predniSONE  (DELTASONE ) 10 MG tablet Take by mouth. 12/18/23   [provider]    Allergies: Breztri  aerosphere [budeson-glycopyrrol-formoterol] and Spiractone [spironolactone]    Review of Systems  Constitutional:  Negative for chills and fever.  HENT:  Positive for congestion.   Eyes:  Negative for visual disturbance.  Respiratory:  Positive for cough and shortness of breath.   Cardiovascular:  Negative for chest pain and leg swelling.  Gastrointestinal:  Negative for abdominal pain and vomiting.  Genitourinary:  Negative for dysuria and flank pain.  Musculoskeletal:  Negative for back pain, neck pain and neck stiffness.  Skin:  Negative for rash.  Neurological:  Negative for light-headedness and headaches.    Updated Vital Signs BP (!) 154/93 (BP Location: Left Arm)   Pulse 77   Temp 97.9 F (36.6 C)   Resp (!) 23   Ht 5' 9 (1.753 m)  Wt 95.3 kg   SpO2 100%   BMI 31.01 kg/m   Physical Exam Vitals and nursing note reviewed.  Constitutional:      General: He is not in acute distress.    Appearance: He is well-developed.  HENT:     Head: Normocephalic and atraumatic.     Mouth/Throat:     Mouth: Mucous membranes are moist.  Eyes:     General:        Right eye: No discharge.        Left eye: No discharge.     Conjunctiva/sclera: Conjunctivae normal.  Neck:     Trachea: No tracheal deviation.  Cardiovascular:     Rate and Rhythm: Normal  rate and regular rhythm.     Heart sounds: No murmur heard. Pulmonary:     Effort: Pulmonary effort is normal. Tachypnea present.     Breath sounds: Decreased breath sounds, wheezing and rhonchi present.  Abdominal:     General: There is no distension.     Palpations: Abdomen is soft.     Tenderness: There is no abdominal tenderness. There is no guarding.  Musculoskeletal:     Cervical back: Normal range of motion and neck supple. No rigidity.  Skin:    General: Skin is warm.     Capillary Refill: Capillary refill takes less than 2 seconds.     Findings: No rash.  Neurological:     General: No focal deficit present.     Mental Status: He is alert.     Cranial Nerves: No cranial nerve deficit.  Psychiatric:        Mood and Affect: Mood normal.     (all labs ordered are listed, but only abnormal results are displayed) Labs Reviewed  BASIC METABOLIC PANEL WITH GFR  CBC  PROTIME-INR    EKG: EKG Interpretation Date/Time:  Wednesday July 03 2024 09:16:07 EDT Ventricular Rate:  98 PR Interval:    QRS Duration:  90 QT Interval:  366 QTC Calculation: 468 R Axis:   1  Text Interpretation: Atrial fibrillation Confirmed by Tonia Chew [id number]) on 07/03/2024 9:34:59 AM  Radiology: [facility] Chest 2 View Result Date: 07/03/2024 CLINICAL DATA:  Shortness of breath getting worse over the past 2 weeks. History of lung cancer. EXAM: CHEST - 2 VIEW COMPARISON:  10/31/2023, CT 10/30/2023 FINDINGS: Lungs are adequately inflated with stable volume loss of the left lung with elevation of the left hemidiaphragm. Normal blunting of the left costophrenic angle likely small amount of pleural fluid versus scarring. Stable focal opacification over the left suprahilar region likely related to patient's left lung cancer post treatment. No acute consolidation. Cardiomediastinal silhouette is unchanged. Stable mild compression fracture over the midthoracic spine. IMPRESSION: 1. No acute cardiopulmonary  disease. 2. Stable volume loss of the left lung with stable focal opacification over the left suprahilar region likely related to patient's left lung cancer post treatment. Electronically Signed   By: Toribio Agreste M.D.   On: 07/03/2024 11:13     Procedures   Medications Ordered in the ED  albuterol  (PROVENTIL ) (2.5 MG/3ML) 0.083% nebulizer solution 5 mg (5 mg Nebulization Given 07/03/24 1204)  ipratropium (ATROVENT) nebulizer solution 0.5 mg (0.5 mg Nebulization Given 07/03/24 1204)  methylPREDNISolone  sodium succinate (SOLU-MEDROL ) 125 mg/2 mL injection 125 mg (125 mg Intravenous Given 07/03/24 1133)  Medical Decision Making Amount and/or Complexity of Data Reviewed Labs: ordered. Radiology: ordered.  Risk Prescription drug management.   Patient presents with clinical concern for acute respiratory difficulty secondary to COPD given history of similar and exam findings.  Other differentials include pneumonia, bronchitis, pulmonary fibrosis, heart failure, pulmonary embolism, other.  Medical records reviewed patient's last CT angiogram was end of February this year showing thickening and fluid collection.  Patient had severe lung disease emphysema at that time.  Patient not requiring oxygen however dyspneic and increased effort with ambulation to the bathroom.  Plan for multiple nebulizers, Solu-Medrol  dosing and reassessment.  Patient comfortable plan.  Screening blood work reviewed independently no anemia, overall unremarkable.  Chest x-ray similar to previous with chronic findings.   Patient improved significantly after 3 nebulizers.  Patient comfortable going home and outpatient follow-up.  Discussed increasing steroid dose and reasons to return.  Patient comfortable plan.     Final diagnoses:  Acute dyspnea  Acute exacerbation of chronic obstructive pulmonary disease (COPD) [facility])    ED Discharge Orders          Ordered    predniSONE   (DELTASONE ) 20 MG tablet        07/03/24 1154               Tonia Chew, MD 07/03/24 1349

## 2024-07-03 NOTE — ED Notes (Signed)

## 2024-07-09 ENCOUNTER — Encounter: Payer: Medicare Other | Admitting: Internal Medicine

## 2024-07-16 ENCOUNTER — Encounter: Payer: Self-pay | Admitting: Family Medicine

## 2024-07-16 ENCOUNTER — Ambulatory Visit (INDEPENDENT_AMBULATORY_CARE_PROVIDER_SITE_OTHER): Admitting: Family Medicine

## 2024-07-16 VITALS — BP 106/67 | HR 69 | Ht 69.0 in | Wt 210.0 lb

## 2024-07-16 DIAGNOSIS — G8929 Other chronic pain: Secondary | ICD-10-CM | POA: Diagnosis not present

## 2024-07-16 DIAGNOSIS — J4489 Other specified chronic obstructive pulmonary disease: Secondary | ICD-10-CM | POA: Diagnosis not present

## 2024-07-16 DIAGNOSIS — M545 Low back pain, unspecified: Secondary | ICD-10-CM

## 2024-07-16 DIAGNOSIS — M353 Polymyalgia rheumatica: Secondary | ICD-10-CM | POA: Diagnosis not present

## 2024-07-16 MED ORDER — AZITHROMYCIN 250 MG PO TABS: ORAL_TABLET | ORAL | 0 refills | Status: AC

## 2024-07-16 MED ORDER — TRAMADOL HCL 50 MG PO TABS: 50.0000 mg | ORAL_TABLET | Freq: Three times a day (TID) | ORAL | 0 refills | Status: AC | PRN

## 2024-07-16 NOTE — Assessment & Plan Note (Signed)
 Reports increased wheezing and shortness of breath, requiring nebulizer use up to twice daily. ED visit two weeks ago for SOB where he was given prednisone  but no antibiotic. Believes symptoms are related to Remicade reaction but may have a component of COPD exacerbation. - Prescribed Azithromycin . - Continue albuterol  as needed.

## 2024-07-16 NOTE — Assessment & Plan Note (Signed)
 New dull, aching low back pain started after renflexis reaction. Not relieved by Tylenol  or heat. Exam with midline lumbar tenderness. No neurologic deficits in lower extremities. - Tramadol  prescribed for short-term pain relief. - Ordered lumbar spine X-ray to be done at [facility]. Can wait one week to see if pain improves before getting the imaging.

## 2024-07-16 NOTE — Patient Instructions (Signed)
 It was nice to see you today,  We addressed the following topics today: - I have sent prescriptions for Tramadol  [facility] - You can take the tramadol  up to every 8 hours as needed for pain, but please try to use it less frequently if possible. - Do not take any NSAIDs like Advil or Aleve. - I have placed an order for a lower back X-ray at [facility] on [address]. You can go at your convenience; the order is good for a year. You may wait a week to see if the pain improves on its own before you go. - If your breathing gets worse or you feel you need treatment for a COPD flare-up, please call the office. - When you call the office and need to be seen sooner than the appointment offered by the central scheduling line, please tell them you need to speak with my office staff or leave a message for me about scheduling. I will get back to you, usually by the next business day. - Your next scheduled follow-up appointment is in February. Please call if things get worse before then.  Have a great day,  Rolan Slain, MD

## 2024-07-16 NOTE — Progress Notes (Unsigned)
 Established Patient Office Visit  Subjective   Patient ID: Christopher Peck, male    DOB: 1946/05/08  Age: 78 y.o. MRN: [id number]  Chief Complaint  Patient presents with   Hospitalization Follow-up    HPI  Subjective - Follow-up after ED visit approximately two weeks ago for severe shortness of breath. Reports breathing is improving slowly. Believes symptoms were a reaction to renflexis (infliximab) infusion. - New onset moderate to severe lower back pain, which began after the infliximab reaction. Describes pain as dull and aching, sometimes sharp with movement. Not relieved by heat or Tylenol . Denies weakness in legs. 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 - Reports increased use of nebulizer to once or twice daily since the shortness of breath episode began. Previously did not require it. Reports wheezing, especially in the morning.  Medications: Prednisone , currently tapering from 40mg  (prescribed at hospital), was on 10mg  prior. Current taper is 30 or 35mg  for two weeks, then decrease by 5mg . Also takes hydroxychloroquine (Plaquenil) for RA, levothyroxine , citalopram  (Celexa ), apixaban  (Eliquis ), and albuterol  inhaler. Reports stopping renflexis due to side effects. Was previously on tramadol  and a muscle relaxer for radiation fibrosis but was taken off in May. Tolerated tramadol  well.  PMH, PSH, FH, Social Hx: PMHx: Rheumatoid Arthritis, COPD, history of cancer with radiation fibrosis, neuropathy.  ROS: Constitutional: Denies weakness. Respiratory: Reports shortness of breath (improving), wheezing. Denies cough. Musculoskeletal: Reports new onset low back pain. Denies leg weakness. 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 Denies new numbness or tingling down the legs.      The 10-year ASCVD risk score (Arnett DK, et al., 2019) is: 17.2%  Health Maintenance Due  Topic Date Due   COVID-19 Vaccine (1) Never done   Zoster Vaccines-  Shingrix (1 of 2) Never done   Influenza Vaccine  04/05/2024      Objective:     BP 106/67   Pulse 69   Ht 5' 9 (1.753 m)   Wt 210 lb (95.3 kg)   SpO2 96%   BMI 31.01 kg/m    Physical Exam Gen: alert, oriented Pulm: no respiratory distress Psych: pleasant affect   No results found for any visits on 07/16/24.      Assessment & Plan:   Chronic midline low back pain without sciatica Assessment & Plan: New dull, aching low back pain started after renflexis reaction. Not relieved by Tylenol  or heat. Exam with midline lumbar tenderness. No neurologic deficits in lower extremities. - Tramadol  prescribed for short-term pain relief. - Ordered lumbar spine X-ray to be done at [facility]. Can wait one week to see if pain improves before getting the imaging.  Orders: -     DG Lumbar Spine Complete; Future  COPD Assessment & Plan: Reports increased wheezing and shortness of breath, requiring nebulizer use up to twice daily. ED visit two weeks ago for SOB where he was given prednisone  but no antibiotic. Believes symptoms are related to Remicade reaction but may have a component of COPD exacerbation. - Prescribed Azithromycin . - Continue albuterol  as needed.   PMR (polymyalgia rheumatica) Assessment & Plan: History of PMR and RA, started on renflexis by rheumatology. Experienced redness and soreness of hands after first infusion. Developed severe shortness of breath one week after the second infusion, requiring an ED visit. Now has new onset low back pain. Reports renflexis is a known cause of low back pain and lung problems. Has decided to discontinue renflexis. - Continue to hold renflexis infusions. -  Will monitor for resolution of symptoms.   Other orders -     Azithromycin ; Take 2 tablets on day 1, then 1 tablet daily on days 2 through 5  Dispense: 6 tablet; Refill: 0 -     traMADol  HCl; Take 1 tablet (50 mg total) by mouth every 8 (eight) hours as needed for up  to 10 days.  Dispense: 30 tablet; Refill: 0     Return for already scheduled.    Toribio MARLA Slain, MD

## 2024-07-16 NOTE — Assessment & Plan Note (Signed)
 History of PMR and RA, started on renflexis by rheumatology. Experienced redness and soreness of hands after first infusion. Developed severe shortness of breath one week after the second infusion, requiring an ED visit. Now has new onset low back pain. Reports renflexis is a known cause of low back pain and lung problems. Has decided to discontinue renflexis. - Continue to hold renflexis infusions. - Will monitor for resolution of symptoms.

## 2024-07-28 ENCOUNTER — Other Ambulatory Visit: Payer: Self-pay | Admitting: Family Medicine

## 2024-07-28 DIAGNOSIS — J4520 Mild intermittent asthma, uncomplicated: Secondary | ICD-10-CM

## 2024-07-29 ENCOUNTER — Telehealth: Payer: Self-pay | Admitting: Cardiology

## 2024-07-29 NOTE — Telephone Encounter (Signed)
*  STAT* If patient is at the pharmacy, call can be transferred to refill team.   1. Which medications need to be refilled? (please list name of each medication and dose if known)  apixaban  (ELIQUIS ) 5 MG TABS tablet    2. Which pharmacy/location (including street and city if local pharmacy) is medication to be sent to? [id number] [place] [place] - [address] [facility] [address] [facility]    3. Do they need a 30 day or 90 day supply?  90 day supply

## 2024-07-30 MED ORDER — APIXABAN 5 MG PO TABS: 5.0000 mg | ORAL_TABLET | Freq: Two times a day (BID) | ORAL | 1 refills | Status: AC

## 2024-07-30 NOTE — Addendum Note (Signed)
 Addended by: JOSHUA ANDREZ PARAS on: 07/30/2024 12:12 PM   Modules accepted: Orders

## 2024-07-30 NOTE — Telephone Encounter (Signed)
 Prescription refill request for Eliquis  received. Indication: a-fib Last office visit: 03/28/2024 Scr: 1.11  (06/25/2024)  Age: 78 Weight: 95.3  Dose appropriate

## 2024-08-12 ENCOUNTER — Other Ambulatory Visit: Payer: Self-pay | Admitting: Family Medicine

## 2024-08-12 NOTE — Telephone Encounter (Unsigned)
 Copied from CRM [id number]. Topic: Clinical - Medication Refill >> Aug 12, 2024  9:17 AM Leonette P wrote: Medication: Levothyroxine  125 mcg  Has the patient contacted their pharmacy? No (This is the patient's preferred pharmacy:  [id number] [place], [place] - [address] AT [employer] [address] [place] [address] Phone: [phone] Fax: [phone]   Is this the correct pharmacy for this prescription? Yes If no, delete pharmacy and type the correct one.   Has the prescription been filled recently? Yes  Is the patient out of the medication? No  Has the patient been seen for an appointment in the last year OR does the patient have an upcoming appointment? Yes  Can we respond through MyChart? No  Agent: Please be advised that Rx refills may take up to 3 business days. We ask that you follow-up with your pharmacy.

## 2024-08-13 MED ORDER — LEVOTHYROXINE SODIUM 125 MCG PO TABS: ORAL_TABLET | ORAL | 3 refills | Status: AC

## 2024-08-14 ENCOUNTER — Emergency Department (HOSPITAL_BASED_OUTPATIENT_CLINIC_OR_DEPARTMENT_OTHER): Admitting: Radiology

## 2024-08-14 ENCOUNTER — Encounter (HOSPITAL_BASED_OUTPATIENT_CLINIC_OR_DEPARTMENT_OTHER): Payer: Self-pay | Admitting: Emergency Medicine

## 2024-08-14 ENCOUNTER — Emergency Department (HOSPITAL_BASED_OUTPATIENT_CLINIC_OR_DEPARTMENT_OTHER)
Admission: EM | Admit: 2024-08-14 | Discharge: 2024-08-14 | Disposition: A | Discharge status: Home / Self Care | Attending: Emergency Medicine | Admitting: Emergency Medicine

## 2024-08-14 ENCOUNTER — Other Ambulatory Visit: Payer: Self-pay

## 2024-08-14 DIAGNOSIS — E039 Hypothyroidism, unspecified: Secondary | ICD-10-CM | POA: Insufficient documentation

## 2024-08-14 DIAGNOSIS — Z79899 Other long term (current) drug therapy: Secondary | ICD-10-CM | POA: Diagnosis not present

## 2024-08-14 DIAGNOSIS — R0602 Shortness of breath: Secondary | ICD-10-CM | POA: Diagnosis present

## 2024-08-14 DIAGNOSIS — Z85118 Personal history of other malignant neoplasm of bronchus and lung: Secondary | ICD-10-CM | POA: Diagnosis not present

## 2024-08-14 DIAGNOSIS — Z7951 Long term (current) use of inhaled steroids: Secondary | ICD-10-CM | POA: Diagnosis not present

## 2024-08-14 DIAGNOSIS — I10 Essential (primary) hypertension: Secondary | ICD-10-CM | POA: Insufficient documentation

## 2024-08-14 DIAGNOSIS — I4891 Unspecified atrial fibrillation: Secondary | ICD-10-CM | POA: Diagnosis not present

## 2024-08-14 DIAGNOSIS — R600 Localized edema: Secondary | ICD-10-CM | POA: Diagnosis not present

## 2024-08-14 DIAGNOSIS — J441 Chronic obstructive pulmonary disease with (acute) exacerbation: Secondary | ICD-10-CM | POA: Insufficient documentation

## 2024-08-14 DIAGNOSIS — Z7901 Long term (current) use of anticoagulants: Secondary | ICD-10-CM | POA: Diagnosis not present

## 2024-08-14 LAB — CBC
HCT: 43.9 % (ref 39.0–52.0)
Hemoglobin: 14.2 g/dL (ref 13.0–17.0)
MCH: 32.1 pg (ref 26.0–34.0)
MCHC: 32.3 g/dL (ref 30.0–36.0)
MCV: 99.1 fL (ref 80.0–100.0)
Platelets: 219 K/uL (ref 150–400)
RBC: 4.43 MIL/uL (ref 4.22–5.81)
RDW: 14.1 % (ref 11.5–15.5)
WBC: 8.9 K/uL (ref 4.0–10.5)
nRBC: 0 % (ref 0.0–0.2)

## 2024-08-14 LAB — BASIC METABOLIC PANEL WITH GFR
Anion gap: 13 (ref 5–15)
BUN: 21 mg/dL (ref 8–23)
CO2: 26 mmol/L (ref 22–32)
Calcium: 9.9 mg/dL (ref 8.9–10.3)
Chloride: 102 mmol/L (ref 98–111)
Creatinine, Ser: 1.08 mg/dL (ref 0.61–1.24)
GFR, Estimated: >60 mL/min (ref >60)
Glucose, Bld: 105 mg/dL — ABNORMAL HIGH (ref 70–99)
Potassium: 4.1 mmol/L (ref 3.5–5.1)
Sodium: 141 mmol/L (ref 135–145)

## 2024-08-14 MED ORDER — METHYLPREDNISOLONE SODIUM SUCC 125 MG IJ SOLR
125.0000 mg | Freq: Once | INTRAMUSCULAR | Status: AC
  Administered 2024-08-14: 125 mg via INTRAVENOUS
  Filled 2024-08-14: qty 2

## 2024-08-14 MED ORDER — ALBUTEROL SULFATE (2.5 MG/3ML) 0.083% IN NEBU
5.0000 mg | INHALATION_SOLUTION | Freq: Once | RESPIRATORY_TRACT | Status: AC
  Administered 2024-08-14: 5 mg via RESPIRATORY_TRACT
  Filled 2024-08-14: qty 6

## 2024-08-14 MED ORDER — IPRATROPIUM BROMIDE 0.02 % IN SOLN
0.5000 mg | Freq: Once | RESPIRATORY_TRACT | Status: AC
  Administered 2024-08-14: 0.5 mg via RESPIRATORY_TRACT
  Filled 2024-08-14: qty 2.5

## 2024-08-14 NOTE — Discharge Instructions (Addendum)
 Please read and follow all provided instructions.  Your diagnoses today include:  1. Shortness of breath     Tests performed today include: An EKG of your heart: shows atrial fibrillation with controlled rate A chest x-ray: no significant fluid build-up, no pneumonia Blood counts and electrolytes: were normal Vital signs. See below for your results today.   Medications prescribed:  None  As we discussed, you can consider taking 40mg -60mg  prednisone  for 4 additional days starting tomorrow for COPD exacerbation, but then return to your usual dose after that.   Take any prescribed medications only as directed.  Follow-up instructions: Please follow-up with your primary care provider as soon as you can for further evaluation of your symptoms.   Return instructions:  SEEK IMMEDIATE MEDICAL ATTENTION IF: You have severe chest pain, especially if the pain is crushing or pressure-like and spreads to the arms, back, neck, or jaw, or if you have sweating, nausea or vomiting, or trouble with breathing. THIS IS AN EMERGENCY. Do not wait to see if the pain will go away. Get medical help at once. Call 911. DO NOT drive yourself to the hospital.  Your chest pain gets worse and does not go away after a few minutes of rest.  You have an attack of chest pain lasting longer than what you usually experience.  You have significant dizziness, if you pass out, or have trouble walking.  You have any other emergent concerns regarding your health.  Your vital signs today were: BP (!) 148/103   Pulse 81   Temp 97.9 F (36.6 C)   Resp 20   SpO2 100%  If your blood pressure (BP) was elevated above 135/85 this visit, please have this repeated by your doctor within one month. --------------

## 2024-08-14 NOTE — ED Provider Notes (Signed)
 [place] EMERGENCY DEPARTMENT AT [facility] Provider Note   CSN: [id number] Arrival date & time: 08/14/24  1220     Patient presents with: Shortness of Breath   Christopher Peck is a 78 y.o. male.   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 He states that he had a rheumatology appointment and they recommended that he come to the ED for evaluation. Pt states they were concerned that he had recurrent fluid build-up in his left lung. He had thoracentesis 10/31/23. He reports baseline SOB. He had ED visit at the end of October, was treated and released. He states it took 3 weeks for him to feel better, but even now he doesn't feel like he's returned to baseline. He's currently taking 25mg  prednisone  daily. He states one of his doctors is trying to wean him off. States he is due to see pulmonology for other reccs. He is using albuterol  at home, not increased. He has baseline cough, no increase in sputum production. Denies cough and fever. Denies orthopnea, worsening lower extremity swelling.        Prior to Admission medications   Medication Sig Start Date End Date Taking? Authorizing Provider  albuterol  (ACCUNEB ) 1.25 MG/3ML nebulizer solution USE 1 VIAL VIA NEBULIZER EVERY 6 HOURS AS NEEDED FOR WHEEZING Patient taking differently: Take 1 ampule by nebulization every 6 (six) hours as needed for wheezing or shortness of breath. 10/04/23   Laurice President, NP  albuterol  (VENTOLIN  HFA) 108 (90 Base) MCG/ACT inhaler INHALE 2 PUFFS INTO THE LUNGS EVERY 4 (FOUR) HOURS AS NEEDED FOR WHEEZING OR SHORTNESS OF BREATH. USE 2 INHALATIONS 15 MINUTES APART EVERY 4 HOURS AS NEEDED TO RESCUE ASTHMA ATTACK 07/29/24   Chandra Toribio POUR, MD  apixaban   (ELIQUIS ) 5 MG TABS tablet Take 1 tablet (5 mg total) by mouth 2 (two) times daily. 07/30/24 01/26/25  Vicci Rollo SAUNDERS, PA-C  citalopram  (CELEXA ) 20 MG tablet TAKE 1 TABLET BY MOUTH EVERY DAY FOR MOOD 03/21/24   Olson, Daniel K, MD  hydroxychloroquine (PLAQUENIL) 200 MG tablet 2 tab Orally daily for 90 12/13/23   [provider]  levothyroxine  (SYNTHROID ) 125 MCG tablet TAKE 1 TABLET DAILY ON AN EMPTY STOMACH WITH WATER FOR 30 MINUTES & NO ANTACID MEDS, CALCIUM OR MAGNESIUM  FOR 4 HOURS & AVOID BIOTIN 08/13/24   Chandra Toribio POUR, MD  pantoprazole  (PROTONIX ) 40 MG tablet TAKE 1 TABLET BY MOUTH EVERY DAY 05/29/24   Chandra Toribio POUR, MD  predniSONE  (DELTASONE ) 10 MG tablet Take by mouth. 12/18/23   [provider]  predniSONE  (DELTASONE ) 20 MG tablet 2 tabs po daily x 4 days 07/03/24   Tonia Chew, MD    Allergies: Breztri  aerosphere [budeson-glycopyrrol-formoterol] and Spiractone [spironolactone]    Review of Systems  Updated Vital Signs BP (!) 148/103   Pulse 81   Temp 97.9 F (36.6 C)   Resp 20   SpO2 100%   Physical Exam Vitals and nursing note reviewed.  Constitutional:      General: He is not in acute distress.    Appearance: He is well-developed.  HENT:     Head: Normocephalic and atraumatic.  Eyes:     General:        Right eye: No discharge.        Left  eye: No discharge.     Conjunctiva/sclera: Conjunctivae normal.  Cardiovascular:     Rate and Rhythm: Normal rate and regular rhythm.     Heart sounds: Normal heart sounds.  Pulmonary:     Effort: Pulmonary effort is normal.     Breath sounds: Examination of the left-middle field reveals decreased breath sounds. Examination of the right-lower field reveals decreased breath sounds and rales. Examination of the left-lower field reveals decreased breath sounds and rales. Decreased breath sounds and rales present. No wheezing or rhonchi.  Abdominal:     Palpations: Abdomen is soft.     Tenderness: There is no  abdominal tenderness.  Musculoskeletal:     Cervical back: Normal range of motion and neck supple.     Right lower leg: Edema present.     Left lower leg: Edema present.     Comments: Trace bilateral LE edema  Skin:    General: Skin is warm and dry.  Neurological:     Mental Status: He is alert.     (all labs ordered are listed, but only abnormal results are displayed) Labs Reviewed  BASIC METABOLIC PANEL WITH GFR - Abnormal; Notable for the following components:      Result Value   Glucose, Bld 105 (*)    All other components within normal limits  CBC    ED ECG REPORT   Date: 08/14/2024  Rate: 91  Rhythm: atrial fibrillation  QRS Axis: normal  Intervals: normal  ST/T Wave abnormalities: normal  Conduction Disutrbances:none  Narrative Interpretation:   Old EKG Reviewed: unchanged from 10/25  I have personally reviewed the EKG tracing and agree with the computerized printout as noted.   Radiology: DG Chest 2 View Result Date: 08/14/2024 CLINICAL DATA:  Shortness of breath EXAM: CHEST - 2 VIEW COMPARISON:  Chest radiograph 07/03/2024. FINDINGS: Chronic small left pleural effusion and elevation of the left hemidiaphragm and left lung base scarring. Background of emphysema. No new consolidation. No pneumothorax. Stable cardiac silhouette. No acute osseous pathology. IMPRESSION: 1. No acute cardiopulmonary process. 2. Chronic small left pleural effusion and left lung base scarring. Electronically Signed   By: Vanetta Chou M.D.   On: 08/14/2024 13:28     Procedures   Medications Ordered in the ED  albuterol  (PROVENTIL ) (2.5 MG/3ML) 0.083% nebulizer solution 5 mg (5 mg Nebulization Given 08/14/24 1413)  ipratropium (ATROVENT ) nebulizer solution 0.5 mg (0.5 mg Nebulization Given 08/14/24 1413)  methylPREDNISolone  sodium succinate (SOLU-MEDROL ) 125 mg/2 mL injection 125 mg (125 mg Intravenous Given 08/14/24 1413)   ED Course  Patient seen and examined. History obtained  directly from patient. Reviewed previous hospitalization and ED notes.   Labs/EKG: Ordered CBC, BMP, EKG.  Imaging: Ordered CXR.  Medications/Fluids: Ordered: solumedrol, albuterol /atrovent .   Most recent vital signs reviewed and are as follows: BP (!) 148/103   Pulse 81   Temp 97.9 F (36.6 C)   Resp 20   SpO2 100%   Initial impression: Acute on chronic SOB, no o2 requirement on room air. Per patient, rheum wanted him to get checked. No evidence of recurrent pleural effusion or CHF today. SOB is chronic, doesn't seem like he has bounced back as well since his most recent exacerbation. No PNA, ACS, PE concerns.   3:29 PM Reassessment performed. Patient appears stable, HR slightly high after albuterol . He ambulated with O2 92-94%. He states he felt good walking and lungs feel more clear after breathing treatment. He is comfortable with dc. He states he  has prednisone  at home. Discussed that if his breathing feels worse, he can consider taking 40-60mg  for 4 additional days starting tomorrow.   Labs personally reviewed and interpreted including: CBC and BMP unremarkable.   Imaging personally visualized and interpreted including: CXR with chronic findings, no PNA, large effusion, etc.   Reviewed pertinent lab work and imaging with patient at bedside. Questions answered. He would like to go home.   Most current vital signs reviewed and are as follows: BP (!) 148/103   Pulse 81   Temp 97.9 F (36.6 C)   Resp 20   SpO2 100%   Plan: Discharge to home.   Prescriptions written for: None  Other home care instructions discussed: Prednisone  as above.   ED return instructions discussed: Worsening SOB, fever, development of CP, worsening sx.   Follow-up instructions discussed: Patient encouraged to follow-up with their PCP in 3 days.                                   Medical Decision Making Amount and/or Complexity of Data Reviewed Labs: ordered. Radiology:  ordered.  Risk Prescription drug management.   Pt here with complex pulm history. He has chronic SOB he states for 20 years. Saw rheum and was recommended he come to ED. There was concern for pleural effusion -- but not borne out on imaging today. He does not appear fluid overloaded. Per patient, SOB is close to recent baseline, no acute change. Vitals look good, no hypoxia at rest or with exertion. Afib relatively controlled. Basic labs look good. He feels symptomatically improved with breathing treatment. Presentation most consistent with a mild exacerbation of underlying COPD, fibrosis. No CP to suggest ACS, PE. No pneumo or PNA on CXR. No anemia. He wants to go home. I feel this is reasonable. He can decide whether or not sx bad enough to warrant steroid burst for 4 days.   The patient's vital signs, pertinent lab work and imaging were reviewed and interpreted as discussed in the ED course. Hospitalization was considered for further testing, treatments, or serial exams/observation. However as patient is well-appearing, has a stable exam, and reassuring studies today, I do not feel that they warrant admission at this time. This plan was discussed with the patient who verbalizes agreement and comfort with this plan and seems reliable and able to return to the Emergency Department with worsening or changing symptoms.       Final diagnoses:  Shortness of breath    ED Discharge Orders     None          Desiderio Chew, DEVONNA 08/14/24 1535    Tonia Chew, MD 08/14/24 1547

## 2024-08-14 NOTE — ED Triage Notes (Signed)
 C/o [facility] since end of October Sent from pulm for fluid build up Hx Emphysema.   Audible expiratory wheezes during triage.

## 2024-08-14 NOTE — ED Notes (Signed)
 Rainbow with LA and BC collected with IV insertion

## 2024-08-14 NOTE — ED Notes (Signed)
 Patient SpO2 was between 92-94 with Pulse Rate 104-115 walking down the hallway and back. Patient  states he was not dizzy or having any issues while walking.

## 2024-08-15 LAB — LAB REPORT - SCANNED: EGFR: 70

## 2024-08-26 ENCOUNTER — Ambulatory Visit

## 2024-08-26 VITALS — BP 132/82 | HR 85 | Temp 97.8°F | Ht 69.0 in | Wt 211.6 lb

## 2024-08-26 DIAGNOSIS — M353 Polymyalgia rheumatica: Secondary | ICD-10-CM

## 2024-08-26 DIAGNOSIS — Z85118 Personal history of other malignant neoplasm of bronchus and lung: Secondary | ICD-10-CM | POA: Diagnosis not present

## 2024-08-26 DIAGNOSIS — J42 Unspecified chronic bronchitis: Secondary | ICD-10-CM | POA: Diagnosis not present

## 2024-08-26 DIAGNOSIS — W881XXS Exposure to radioactive isotopes, sequela: Secondary | ICD-10-CM

## 2024-08-26 DIAGNOSIS — Z87891 Personal history of nicotine dependence: Secondary | ICD-10-CM | POA: Diagnosis not present

## 2024-08-26 DIAGNOSIS — J90 Pleural effusion, not elsewhere classified: Secondary | ICD-10-CM

## 2024-08-26 DIAGNOSIS — Z7952 Long term (current) use of systemic steroids: Secondary | ICD-10-CM

## 2024-08-26 DIAGNOSIS — J700 Acute pulmonary manifestations due to radiation: Secondary | ICD-10-CM | POA: Diagnosis not present

## 2024-08-26 LAB — CBC WITH DIFFERENTIAL/PLATELET
Basophils Absolute: 0.1 K/uL (ref 0.0–0.1)
Basophils Relative: 0.5 % (ref 0.0–3.0)
Eosinophils Absolute: 0.1 K/uL (ref 0.0–0.7)
Eosinophils Relative: 0.7 % (ref 0.0–5.0)
HCT: 43.5 % (ref 39.0–52.0)
Hemoglobin: 14.2 g/dL (ref 13.0–17.0)
Lymphocytes Relative: 8.2 % — ABNORMAL LOW (ref 12.0–46.0)
Lymphs Abs: 0.9 K/uL (ref 0.7–4.0)
MCHC: 32.7 g/dL (ref 30.0–36.0)
MCV: 97.6 fl (ref 78.0–100.0)
Monocytes Absolute: 0.8 K/uL (ref 0.1–1.0)
Monocytes Relative: 7.6 % (ref 3.0–12.0)
Neutro Abs: 9.2 K/uL — ABNORMAL HIGH (ref 1.4–7.7)
Neutrophils Relative %: 83 % — ABNORMAL HIGH (ref 43.0–77.0)
Platelets: 219 K/uL (ref 150.0–400.0)
RBC: 4.46 Mil/uL (ref 4.22–5.81)
RDW: 14.9 % (ref 11.5–15.5)
WBC: 11 K/uL — ABNORMAL HIGH (ref 4.0–10.5)

## 2024-08-26 LAB — BRAIN NATRIURETIC PEPTIDE: Pro B Natriuretic peptide (BNP): 173 pg/mL — ABNORMAL HIGH (ref 1.0–100.0)

## 2024-08-26 MED ORDER — STIOLTO RESPIMAT 2.5-2.5 MCG/ACT IN AERS: 2.0000 | INHALATION_SPRAY | Freq: Every day | RESPIRATORY_TRACT | 6 refills | Status: AC

## 2024-08-26 NOTE — Progress Notes (Signed)
 "  New Patient Pulmonology Office Visit   Subjective:  Patient ID: Christopher Peck, male    DOB: 27-Sep-1945  MRN: [id number]  Referred by: Alverda Mardy SAUNDERS, MD  CC:  Chief Complaint  Patient presents with   Consult    Pt stated he has been having breathing problems for a while SOB occurs w/ any activity     HPI Christopher Peck is a 78 y.o. male who is referred to this clinic from [facility] rheumatology for shortness of breath, possible COPD, history of lung cancer.  Discussed the use of AI scribe software for clinical note transcription with the patient, who gave verbal consent to proceed.  History of Present Illness Christopher Peck is a 78 year old male with lung cancer and COPD who presents with worsening shortness of breath.  He has a history of lung cancer diagnosed in 2006, treated with radiation and chemotherapy, including carboplatin, Paxil, and Taxotere. No hx of disease recurrence. He has not followed up with an oncologist since 2009. He reports a history of radiation treatment to the left side with subsequent radiation pneumonitis and COPD.  Last year, he experienced a viral infection that worsened around Christmas, leading to respiratory failure and hospitalization. Following this, he developed severe joint pain and was diagnosed with rheumatoid arthritis and polymyalgia rheumatica. He was started on hydroxychloroquine and prednisone , later adding Renflexis, which worsened his breathing, leading to two emergency room visits. He is currently on prednisone  20 mg daily, which he is tapering, and Plaquenil. In 10/2023 he was admitted to the hospital for COPD exacerbation.  Found to have left-sided pleural effusion at that time, underwent thoracentesis by IR and pleural fluid had 1.8 albumin, LDH not available, 49% neutrophils, was thought to be secondary to parapneumonic process.  CT chest had shown a small left loculated pleural effusion and some mediastinal lymphadenopathy along  with left lung radiation changes. Hasn't had repeat CT chest since then.  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 No history of stroke or seizures, attributing past syncope to carotid artery blockage. He is on Eliquis  for atrial fibrillation.  He has a significant smoking history, having smoked for 40 years before quitting 20 years ago. He worked as a games developer. He has a hx of emphysema and COPD.  He uses albuterol  via a nebulizer daily, sometimes twice a day, which provides significant relief. He has tried various inhalers (Brovana, Breo, Breztri ) but experienced adverse reactions, including severe breathing problems and chest pain.  He experiences daily wheezing and shortness of breath, especially with exertion, though his oxygen levels remain in the mid to high 90s. His shortness of breath worsens when tapering prednisone , which he finds helps his breathing significantly. He also reports dyspnea on exertion.  Interestingly he reports his oxygen saturation stays more than 95% during exertion  Echocardiogram February/2025 showed dilation of left atrium and right atrium.  LVEF is within normal limits.  Has not had ischemic cardiac evaluation  Lives with his wife.  She has a background in healthcare.  He is retired     ROS Review of symptoms negative except mentioned above   Allergies: Breztri  aerosphere [budeson-glycopyrrol-formoterol] and Spiractone [spironolactone] Current Medications[1] 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   Emphysema of lung (HCC)    History of bilateral inguinal hernia repair 05/11/2009  Hypertension    Seizures (HCC) 11/30/2020   Vitamin D  deficiency 01/15/2014   Past Surgical History:  Procedure Laterality Date   AORTIC ARCH ANGIOGRAPHY N/A 12/09/2020   Procedure: AORTIC ARCH ANGIOGRAPHY;   Surgeon: Magda Debby SAILOR, MD;  Location: [facility] INVASIVE CV LAB;  Service: Cardiovascular;  Laterality: N/A;   CATARACT EXTRACTION, BILATERAL     08/21, 09/21   HERNIA REPAIR     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   Procedure: PERIPHERAL VASCULAR INTERVENTION;  Surgeon: Magda Debby SAILOR, MD;  Location: [facility] INVASIVE CV LAB;  Service: Cardiovascular;  Laterality: Left;  LCCA   Family History  Problem Relation Age of Onset   COPD Father    Kidney failure Father    Social History   Socioeconomic History   Marital status: Married    Spouse name: Not on file   Number of children: Not on file   Years of education: Not on file   Highest education level: Not on file  Occupational History   Not on file  Tobacco Use   Smoking status: Former    Current packs/day: 0.00    Types: Cigarettes    Quit date: 09/05/2004    Years since quitting: 19.9   Smokeless tobacco: Never  Vaping Use   Vaping status: Never Used  Substance and Sexual Activity   Alcohol use: Not Currently    Alcohol/week: 3.0 standard drinks of alcohol    Types: 3 Standard drinks or equivalent per week    Comment: none   Drug use: Never   Sexual activity: Not on file  Other Topics Concern   Not on file  Social History Narrative   Right handed   Caffeine use:  1 cup coffee and tea per day   Lives with spouse, Damien.   Social Drivers of Health   Tobacco Use: Medium Risk (08/26/2024)   Patient History    Smoking Tobacco Use: Former    Smokeless Tobacco Use: Never    Passive Exposure: Not on file  Financial Resource Strain: Low Risk (01/25/2024)   Overall Financial Resource Strain (CARDIA)    Difficulty of Paying Living Expenses: Not hard at all  Food Insecurity: No Food Insecurity (01/25/2024)   Hunger Vital Sign    Worried About Running Out of Food in the Last Year: Never true    Ran Out of Food in the Last Year: Never true  Transportation Needs: No Transportation Needs (01/25/2024)   PRAPARE -  Administrator, Civil Service (Medical): No    Lack of Transportation (Non-Medical): No  Physical Activity: Inactive (01/25/2024)   Exercise Vital Sign    Days of Exercise per Week: 0 days    Minutes of Exercise per Session: 0 min  Stress: No Stress Concern Present (01/25/2024)   [employer] of Occupational Health - Occupational Stress Questionnaire    Feeling of Stress : Not at all  Social Connections: Socially Isolated (01/25/2024)   Social Connection and Isolation Panel    Frequency of Communication with Friends and Family: Once a week    Frequency of Social Gatherings with Friends and Family: Never    Attends Religious Services: Never    Database Administrator or Organizations: No    Attends Banker Meetings: Never    Marital Status: Married  Catering Manager Violence: Not At Risk (01/25/2024)   Humiliation, Afraid, Rape, and Kick questionnaire    Fear of Current or Ex-Partner: No    Emotionally Abused: No  Physically Abused: No    Sexually Abused: No  Depression (PHQ2-9): Low Risk (07/16/2024)   Depression (PHQ2-9)    PHQ-2 Score: 0  Alcohol Screen: Low Risk (01/25/2024)   Alcohol Screen    Last Alcohol Screening Score (AUDIT): 0  Housing: Low Risk (01/25/2024)   Housing Stability Vital Sign    Unable to Pay for Housing in the Last Year: No    Number of Times Moved in the Last Year: 0    Homeless in the Last Year: No  Recent Concern: Housing - High Risk (10/31/2023)   Housing Stability Vital Sign    Unable to Pay for Housing in the Last Year: Yes    Number of Times Moved in the Last Year: 0    Homeless in the Last Year: No  Utilities: Not At Risk (01/25/2024)   A[employer] Utilities    Threatened with loss of utilities: No  Health Literacy: Adequate Health Literacy (01/25/2024)   B1300 Health Literacy    Frequency of need for help with medical instructions: Never         Objective:  BP 132/82   Pulse 85   Temp 97.8 F (36.6 C) (Oral)   Ht 5'  9 (1.753 m) Comment: per patient  Wt 211 lb 9.6 oz (96 kg)   SpO2 97% Comment: on RA  BMI 31.25 kg/m    Physical Exam Constitutional:      General: He is not in acute distress.    Appearance: Normal appearance.  HENT:     Mouth/Throat:     Mouth: Mucous membranes are moist.  Cardiovascular:     Rate and Rhythm: Normal rate.  Pulmonary:     Effort: No respiratory distress.     Breath sounds: No wheezing or rales.  Musculoskeletal:     Right lower leg: No edema.     Left lower leg: No edema.  Skin:    General: Skin is warm.  Neurological:     Mental Status: He is alert and oriented to person, place, and time.  Psychiatric:        Mood and Affect: Mood normal.     Diagnostic Review:    Pft     No data to display               Results Ct chest 10/2023 Moderate pleural thickening medially involving the left hemithorax may be progressive radiation fibrosis. There is a loculated left pleural fluid collection new since the prior chest CT. This measures as simple fluid. Associated loss of volume in the left hemithorax and dense radiation changes involving the paramediastinal lung tissue. No findings suspicious for recurrent tumor.  Severe underlying lung disease with emphysema and respiratory bronchiolitis. No worrisome pulmonary nodules to suggest metastatic disease. The central tracheobronchial tree is unremarkable.  Mildly enlarged mediastinal and hilar lymph nodes. 15 mm right hilar node previously measured 6 mm. The esophagus is grossly normal. Scattered coronary artery calcifications.  Echo 10/2023 1. Left ventricular ejection fraction, by estimation, is 60 to 65%. The  left ventricle has normal function. The left ventricle has no regional  wall motion abnormalities. Left ventricular diastolic function could not  be evaluated.   2. Right ventricular systolic function is normal. The right ventricular  size is normal.   3. Left atrial size was mildly  dilated.   4. Right atrial size was mildly dilated.   5. The mitral valve is normal in structure. No evidence of mitral valve  regurgitation. No  evidence of mitral stenosis.   6. The aortic valve is tricuspid. There is mild calcification of the  aortic valve. Aortic valve regurgitation is not visualized. No aortic  stenosis is present.   7. The inferior vena cava is normal in size with greater than 50%  respiratory variability, suggesting right atrial pressure of 3 mmHg.    Labs Pleural fluid albumin (10/2023): 1.8 Pleural fluid glucose (10/2023): 130 Pleural fluid differential (10/2023): Neutrophil predominant predominant Pleural fluid cultures (10/2023): No organisms detected  Radiology Chest X-ray December/2025 Chronic small left pleural effusion and left base scarring Elevated left hemidiaphragm  Reviewed hospital admission and discharge notes from February/2025 Reviewed referral notes      Assessment & Plan:   Assessment & Plan Chronic bronchitis, unspecified chronic bronchitis type (HCC) Discussed the symptoms, etiology, pathophysiology, diagnostic test, treatment, flare ups,  prognosis of copd Tried and failed or had adverse effects with brovana, Breztri  and Breo. Will prescribe Stiolto.  No elevated eosinophils in the past Have history of asthma or significant allergies Continue albuterol  inhaler and nebulizer as needed Discussed the difference between maintenance and rescue inhalers Orders:   CBC with Differential   IgE; Future   Pulmonary function test; Future  PMR (polymyalgia rheumatica) As per rheumatology. Patient is on 20 mg prednisone  currently  He reports worsening shortness of breath  when he tries to wean himself off steroid    Pleural effusion on left In 10/2023 Has chronically elevated left hemidiaphragm Orders:   B Nat Peptide; Future   CT Chest Wo Contrast; Future  History of lung cancer CT chest February 2025 had shown some  lymphadenopathy. I will order a follow-up CT chest from now Orders:   CT Chest Wo Contrast; Future  Radiation pneumonitis Chronic, will follow-up with new CT chest Orders:   CBC with Differential   IgE; Future   Pulmonary function test; Future  Current chronic use of systemic steroids As per rheumatology Will attempt to optimize his inhalers, so he does not need to be on steroids for pulmonary reasons      Thank you for the opportunity to take part in the care of CAUY MELODY   Return in about 5 weeks (around 10/01/2024).   Delanda Bulluck Pleas, MD [facility] Office: [phone]     [1]  Current Outpatient Medications:    albuterol  (ACCUNEB ) 1.25 MG/3ML nebulizer solution, USE 1 VIAL VIA NEBULIZER EVERY 6 HOURS AS NEEDED FOR WHEEZING, Disp: 75 mL, Rfl: 20   albuterol  (VENTOLIN  HFA) 108 (90 Base) MCG/ACT inhaler, INHALE 2 PUFFS INTO THE LUNGS EVERY 4 (FOUR) HOURS AS NEEDED FOR WHEEZING OR SHORTNESS OF BREATH. USE 2 INHALATIONS 15 MINUTES APART EVERY 4 HOURS AS NEEDED TO RESCUE ASTHMA ATTACK, Disp: 18 each, Rfl: 5   apixaban  (ELIQUIS ) 5 MG TABS tablet, Take 1 tablet (5 mg total) by mouth 2 (two) times daily., Disp: 180 tablet, Rfl: 1   citalopram  (CELEXA ) 20 MG tablet, TAKE 1 TABLET BY MOUTH EVERY DAY FOR MOOD, Disp: 90 tablet, Rfl: 3   hydroxychloroquine (PLAQUENIL) 200 MG tablet, 2 tab Orally daily for 90, Disp: , Rfl:    levothyroxine  (SYNTHROID ) 125 MCG tablet, TAKE 1 TABLET DAILY ON AN EMPTY STOMACH WITH WATER FOR 30 MINUTES & NO ANTACID MEDS, CALCIUM OR MAGNESIUM  FOR 4 HOURS & AVOID BIOTIN, Disp: 90 tablet, Rfl: 3   pantoprazole  (PROTONIX ) 40 MG tablet, TAKE 1 TABLET BY MOUTH EVERY DAY, Disp: 90 tablet, Rfl: 0   Tiotropium Bromide-Olodaterol (  STIOLTO RESPIMAT ) 2.5-2.5 MCG/ACT AERS, Inhale 2 puffs into the lungs daily., Disp: 4 g, Rfl: 6   predniSONE  (DELTASONE ) 10 MG tablet, Take by mouth. (Patient not taking: Reported on 08/26/2024), Disp: , Rfl:     predniSONE  (DELTASONE ) 20 MG tablet, 2 tabs po daily x 4 days (Patient not taking: Reported on 08/26/2024), Disp: 8 tablet, Rfl: 0  "

## 2024-08-26 NOTE — Assessment & Plan Note (Addendum)
 As per rheumatology. Patient is on 20 mg prednisone  currently  He reports worsening shortness of breath  when he tries to wean himself off steroid

## 2024-08-26 NOTE — Patient Instructions (Signed)
 VISIT SUMMARY: Today, we addressed your worsening shortness of breath and reviewed your history of lung cancer, COPD, and other health issues. We discussed your current medications and made some adjustments to better manage your symptoms.  YOUR PLAN: CHRONIC OBSTRUCTIVE PULMONARY DISEASE (COPD) WITH HISTORY OF ACUTE EXACERBATIONS: You have COPD with a history of flare-ups and have experienced adverse reactions to several inhalers. Your current use of an albuterol  nebulizer provides relief. -We ordered a breathing test to see if you need a steroid component in your inhaler. -We prescribed a new nonsteroidal inhaler with two bronchodilators in mist form called stiolto. Use this once a year. -We will monitor your insurance approval and the cost of the new inhaler and can switch as needed -If your shortness of breath continues, consider a cardiac evaluation.  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 You have not had a recent oncology follow-up. -We ordered a CT scan to check the current status of your lungs and lymph nodes. -We scheduled the CT scan closer to your next appointment to avoid any confounding factors from your current illness.  LEFT PLEURAL EFFUSION: You had fluid in your left lung that was drained in February 2025. Recent imaging shows no significant fluid accumulation. -We will continue to monitor for any changes in your pleural effusion status.  CHRONIC SYSTEMIC STEROID USE: You are on prednisone  for rheumatoid arthritis and respiratory symptoms, currently at 20 mg daily, and are in the process of tapering off. -Continue tapering prednisone  as per your rheumatologist's guidance. -Monitor your respiratory symptoms during the tapering process.   Contains text generated by [employer].

## 2024-08-26 NOTE — Assessment & Plan Note (Addendum)
 In 10/2023 Has chronically elevated left hemidiaphragm Orders:   B Nat Peptide; Future   CT Chest Wo Contrast; Future

## 2024-08-27 ENCOUNTER — Telehealth: Payer: Self-pay

## 2024-08-27 ENCOUNTER — Other Ambulatory Visit: Payer: Self-pay | Admitting: Family Medicine

## 2024-08-27 LAB — IGE: IgE (Immunoglobulin E), Serum: 3 kU/L

## 2024-08-27 MED ORDER — SPIRIVA RESPIMAT 2.5 MCG/ACT IN AERS: 2.0000 | INHALATION_SPRAY | Freq: Every day | RESPIRATORY_TRACT | Status: AC

## 2024-08-27 NOTE — Telephone Encounter (Signed)
 Copied from CRM #[id number]. Topic: Clinical - Medication Question >> Aug 26, 2024  3:42 PM Devaughn RAMAN wrote: Reason for CRM: Pt calling in regards to Tiotropium Bromide-Olodaterol (STIOLTO RESPIMAT ) 2.5-2.5 MCG/ACT AERS prescription and Spiriva  samples. Pt stated he was given both at his appt and wanted to inquire if he is supposed to be taking both or 1.   Pt states he was given spiriva  sample at ov and rx was called in for Stiolto. Advised pt to use sample and then pick up prescription. Pt states he met his deductible and was able to pick up Stiolto inhaler at no charge. Pt has also been scheduled PFT appt. Nothing further needed.

## 2024-08-27 NOTE — Addendum Note (Signed)
 Addended by: BURT EVERTT RAMAN on: 08/27/2024 10:35 AM   Modules accepted: Orders

## 2024-09-18 ENCOUNTER — Ambulatory Visit (HOSPITAL_BASED_OUTPATIENT_CLINIC_OR_DEPARTMENT_OTHER)

## 2024-09-18 DIAGNOSIS — J42 Unspecified chronic bronchitis: Secondary | ICD-10-CM

## 2024-09-18 DIAGNOSIS — J700 Acute pulmonary manifestations due to radiation: Secondary | ICD-10-CM

## 2024-09-18 LAB — PULMONARY FUNCTION TEST
DL/VA % pred: 88 %
DL/VA: 3.5 ml/min/mmHg/L
DLCO unc % pred: 50 %
DLCO unc: 12.17 ml/min/mmHg
FEF 25-75 Post: 1.41 L/s
FEF 25-75 Pre: 0.76 L/s
FEF2575-%Change-Post: 84 %
FEF2575-%Pred-Post: 67 %
FEF2575-%Pred-Pre: 36 %
FEV1-%Change-Post: 15 %
FEV1-%Pred-Post: 62 %
FEV1-%Pred-Pre: 54 %
FEV1-Post: 1.82 L
FEV1-Pre: 1.58 L
FEV1FVC-%Change-Post: 6 %
FEV1FVC-%Pred-Pre: 88 %
FEV6-%Change-Post: 10 %
FEV6-%Pred-Post: 70 %
FEV6-%Pred-Pre: 63 %
FEV6-Post: 2.65 L
FEV6-Pre: 2.39 L
FEV6FVC-%Change-Post: 2 %
FEV6FVC-%Pred-Post: 106 %
FEV6FVC-%Pred-Pre: 103 %
FVC-%Change-Post: 8 %
FVC-%Pred-Post: 66 %
FVC-%Pred-Pre: 61 %
FVC-Post: 2.67 L
FVC-Pre: 2.47 L
Post FEV1/FVC ratio: 68 %
Post FEV6/FVC ratio: 99 %
Pre FEV1/FVC ratio: 64 %
Pre FEV6/FVC Ratio: 97 %
RV % pred: 106 %
RV: 2.68 L
TLC % pred: 76 %
TLC: 5.23 L

## 2024-09-18 NOTE — Progress Notes (Signed)
 Full PFT performed day.

## 2024-09-18 NOTE — Patient Instructions (Signed)
 Full PFT performed day.

## 2024-09-30 ENCOUNTER — Other Ambulatory Visit

## 2024-10-03 ENCOUNTER — Ambulatory Visit

## 2024-10-04 ENCOUNTER — Other Ambulatory Visit: Payer: Self-pay | Admitting: Family Medicine

## 2024-10-04 DIAGNOSIS — R7303 Prediabetes: Secondary | ICD-10-CM

## 2024-10-04 DIAGNOSIS — E66811 Obesity, class 1: Secondary | ICD-10-CM

## 2024-10-04 DIAGNOSIS — R0989 Other specified symptoms and signs involving the circulatory and respiratory systems: Secondary | ICD-10-CM

## 2024-10-04 DIAGNOSIS — E039 Hypothyroidism, unspecified: Secondary | ICD-10-CM

## 2024-10-04 DIAGNOSIS — J4489 Other specified chronic obstructive pulmonary disease: Secondary | ICD-10-CM

## 2024-10-04 DIAGNOSIS — E782 Mixed hyperlipidemia: Secondary | ICD-10-CM

## 2024-10-04 DIAGNOSIS — I482 Chronic atrial fibrillation, unspecified: Secondary | ICD-10-CM

## 2024-10-04 DIAGNOSIS — M0579 Rheumatoid arthritis with rheumatoid factor of multiple sites without organ or systems involvement: Secondary | ICD-10-CM

## 2024-10-10 ENCOUNTER — Other Ambulatory Visit

## 2024-10-14 ENCOUNTER — Other Ambulatory Visit

## 2024-10-17 ENCOUNTER — Encounter: Admitting: Family Medicine

## 2024-10-22 ENCOUNTER — Other Ambulatory Visit

## 2024-12-10 ENCOUNTER — Ambulatory Visit

## 2025-02-13 ENCOUNTER — Ambulatory Visit
